# Patient Record
Sex: Female | Born: 1970 | Race: Black or African American | Hispanic: No | Marital: Married | Smoking: Never smoker
Health system: Southern US, Community
[De-identification: ages and names within clinical notes are randomized; demographics above are authoritative.]

## PROBLEM LIST (undated history)

## (undated) DIAGNOSIS — B019 Varicella without complication: Secondary | ICD-10-CM

## (undated) DIAGNOSIS — I10 Essential (primary) hypertension: Secondary | ICD-10-CM

## (undated) DIAGNOSIS — R51 Headache: Secondary | ICD-10-CM

## (undated) DIAGNOSIS — R519 Headache, unspecified: Secondary | ICD-10-CM

## (undated) DIAGNOSIS — R030 Elevated blood-pressure reading, without diagnosis of hypertension: Secondary | ICD-10-CM

## (undated) DIAGNOSIS — IMO0001 Reserved for inherently not codable concepts without codable children: Secondary | ICD-10-CM

## (undated) DIAGNOSIS — J45909 Unspecified asthma, uncomplicated: Secondary | ICD-10-CM

## (undated) DIAGNOSIS — M549 Dorsalgia, unspecified: Secondary | ICD-10-CM

## (undated) DIAGNOSIS — F419 Anxiety disorder, unspecified: Secondary | ICD-10-CM

## (undated) HISTORY — DX: Unspecified asthma, uncomplicated: J45.909

## (undated) HISTORY — DX: Anxiety disorder, unspecified: F41.9

## (undated) HISTORY — DX: Reserved for inherently not codable concepts without codable children: IMO0001

## (undated) HISTORY — DX: Headache, unspecified: R51.9

## (undated) HISTORY — PX: EYE SURGERY: SHX253

## (undated) HISTORY — DX: Headache: R51

## (undated) HISTORY — DX: Varicella without complication: B01.9

## (undated) HISTORY — DX: Dorsalgia, unspecified: M54.9

## (undated) HISTORY — PX: APPENDECTOMY: SHX54

## (undated) HISTORY — DX: Elevated blood-pressure reading, without diagnosis of hypertension: R03.0

---

## 1999-01-31 ENCOUNTER — Emergency Department (HOSPITAL_COMMUNITY): Admission: EM | Admit: 1999-01-31 | Discharge: 1999-01-31 | Payer: Self-pay | Admitting: Emergency Medicine

## 2003-02-04 ENCOUNTER — Emergency Department (HOSPITAL_COMMUNITY): Admission: EM | Admit: 2003-02-04 | Discharge: 2003-02-04 | Payer: Self-pay | Admitting: Emergency Medicine

## 2003-07-04 ENCOUNTER — Emergency Department (HOSPITAL_COMMUNITY): Admission: EM | Admit: 2003-07-04 | Discharge: 2003-07-04 | Payer: Self-pay | Admitting: Emergency Medicine

## 2004-07-05 ENCOUNTER — Emergency Department: Payer: Self-pay | Admitting: Emergency Medicine

## 2004-08-15 ENCOUNTER — Emergency Department: Payer: Self-pay | Admitting: Emergency Medicine

## 2004-11-15 ENCOUNTER — Emergency Department: Payer: Self-pay | Admitting: Emergency Medicine

## 2005-01-08 ENCOUNTER — Emergency Department: Payer: Self-pay | Admitting: Emergency Medicine

## 2006-06-05 ENCOUNTER — Other Ambulatory Visit: Payer: Self-pay

## 2006-06-05 ENCOUNTER — Emergency Department: Payer: Self-pay | Admitting: Emergency Medicine

## 2006-10-21 ENCOUNTER — Ambulatory Visit (HOSPITAL_COMMUNITY): Admission: RE | Admit: 2006-10-21 | Discharge: 2006-10-22 | Payer: Self-pay | Admitting: Ophthalmology

## 2007-07-06 ENCOUNTER — Emergency Department: Payer: Self-pay | Admitting: Emergency Medicine

## 2008-03-02 ENCOUNTER — Inpatient Hospital Stay: Payer: Self-pay | Admitting: Vascular Surgery

## 2009-09-15 ENCOUNTER — Emergency Department: Payer: Self-pay | Admitting: Emergency Medicine

## 2010-01-21 ENCOUNTER — Emergency Department (HOSPITAL_COMMUNITY): Admission: EM | Admit: 2010-01-21 | Discharge: 2010-01-21 | Payer: Self-pay | Admitting: Emergency Medicine

## 2010-03-25 ENCOUNTER — Emergency Department (HOSPITAL_COMMUNITY): Admission: EM | Admit: 2010-03-25 | Discharge: 2010-03-25 | Payer: Self-pay | Admitting: Emergency Medicine

## 2010-11-26 LAB — GC/CHLAMYDIA PROBE AMP, GENITAL
Chlamydia, DNA Probe: NEGATIVE
GC Probe Amp, Genital: NEGATIVE

## 2010-11-26 LAB — POCT I-STAT, CHEM 8
BUN: 9 mg/dL (ref 6–23)
Calcium, Ion: 1.15 mmol/L (ref 1.12–1.32)
Chloride: 105 mEq/L (ref 96–112)
Creatinine, Ser: 0.6 mg/dL (ref 0.4–1.2)
Glucose, Bld: 123 mg/dL — ABNORMAL HIGH (ref 70–99)
HCT: 38 % (ref 36.0–46.0)
Hemoglobin: 12.9 g/dL (ref 12.0–15.0)
Potassium: 4 mEq/L (ref 3.5–5.1)
Sodium: 139 mEq/L (ref 135–145)
TCO2: 25 mmol/L (ref 0–100)

## 2010-11-26 LAB — DIFFERENTIAL
Basophils Absolute: 0 10*3/uL (ref 0.0–0.1)
Basophils Relative: 0 % (ref 0–1)
Eosinophils Absolute: 0.1 10*3/uL (ref 0.0–0.7)
Eosinophils Relative: 2 % (ref 0–5)
Lymphocytes Relative: 24 % (ref 12–46)
Lymphs Abs: 1.3 10*3/uL (ref 0.7–4.0)
Monocytes Absolute: 0.3 10*3/uL (ref 0.1–1.0)
Monocytes Relative: 6 % (ref 3–12)
Neutro Abs: 3.7 10*3/uL (ref 1.7–7.7)
Neutrophils Relative %: 68 % (ref 43–77)

## 2010-11-26 LAB — CBC
HCT: 36.1 % (ref 36.0–46.0)
HCT: 38.9 % (ref 36.0–46.0)
Hemoglobin: 12.5 g/dL (ref 12.0–15.0)
Hemoglobin: 13.2 g/dL (ref 12.0–15.0)
MCHC: 33.9 g/dL (ref 30.0–36.0)
MCHC: 34.7 g/dL (ref 30.0–36.0)
MCV: 90 fL (ref 78.0–100.0)
MCV: 90.2 fL (ref 78.0–100.0)
Platelets: 106 10*3/uL — ABNORMAL LOW (ref 150–400)
Platelets: 109 10*3/uL — ABNORMAL LOW (ref 150–400)
RBC: 4.01 MIL/uL (ref 3.87–5.11)
RBC: 4.32 MIL/uL (ref 3.87–5.11)
RDW: 13.4 % (ref 11.5–15.5)
RDW: 13.4 % (ref 11.5–15.5)
WBC: 5.5 10*3/uL (ref 4.0–10.5)
WBC: 5.9 10*3/uL (ref 4.0–10.5)

## 2010-11-26 LAB — WET PREP, GENITAL
Trich, Wet Prep: NONE SEEN
WBC, Wet Prep HPF POC: NONE SEEN
Yeast Wet Prep HPF POC: NONE SEEN

## 2010-11-26 LAB — URINALYSIS, ROUTINE W REFLEX MICROSCOPIC
Bilirubin Urine: NEGATIVE
Glucose, UA: NEGATIVE mg/dL
Ketones, ur: NEGATIVE mg/dL
Nitrite: NEGATIVE
Protein, ur: NEGATIVE mg/dL
Specific Gravity, Urine: 1.018 (ref 1.005–1.030)
Urobilinogen, UA: 0.2 mg/dL (ref 0.0–1.0)
pH: 5 (ref 5.0–8.0)

## 2010-11-26 LAB — URINE MICROSCOPIC-ADD ON

## 2010-11-26 LAB — URINE CULTURE
Colony Count: NO GROWTH
Culture: NO GROWTH

## 2010-11-26 LAB — POCT PREGNANCY, URINE: Preg Test, Ur: NEGATIVE

## 2010-12-16 ENCOUNTER — Emergency Department: Payer: Self-pay | Admitting: Emergency Medicine

## 2011-01-25 NOTE — Op Note (Signed)
Karina Neal, Karina Neal                ACCOUNT NO.:  [id number]   MEDICAL RECORD NO.:  [id number]          PATIENT TYPE:  AMB   LOCATION:  SDS                          FACILITY:  [facility]   PHYSICIAN:  John D. [name], M.D. DATE OF BIRTH:  01/11/1971   DATE OF PROCEDURE:  10/21/2006  DATE OF DISCHARGE:                               OPERATIVE REPORT   ADMISSION DIAGNOSIS:  Rhegmatogenous retinal detachment, left eye;  retinal breaks, right eye.   PROCEDURES:  Retinal photocoagulation for retinal breaks, right eye.  Scleral buckle with retinal photocoagulation, left eye.   SURGEON:  Alan Mulder, M.D.   ASSISTANT:  Rosalie Doctor, MA   ANESTHESIA:  General.   DETAILS:  After proper endotracheal anesthesia was obtained, the  attention was carried to the right eye where retinal breaks were seen.  Indirect ophthalmoscope laser was used to surround the breaks with laser  the power was 270 milliwatts, 656 burns, 1000 microns each and 0.05  seconds each.  The eye was treated with ointment and covered.  Attention  was carried the left eye where the usual prep and drape was performed.  360 degrees limbal peritomy, isolation of four rectus muscles on 2-0  silk.  Localization of breaks in the nasal quadrant with detachment in  the nasal quadrant.  Scleral dissection from 4 o'clock around to 2  o'clock to admit a number 279 intrascleral implant.  Diathermy placed in  the bed. Two sutures per quadrant for a total of six sutures were placed  in the scleral flaps.  A 240 band was placed around the eye with a belt  loop at 2 o'clock and 4 o'clock and a 270 sleeve at 10 o'clock.  Perforations site chosen at 9 o'clock.  A moderate amount of clear  colorless subretinal fluid came forth.  Some pockets were thin and some  pockets were thick and sticky.  When retinal pigment came from came out  of the break of the perforation site, the drainage was stopped.  The 279  implant was placed against the globe  and the scleral flaps were closed.  Indirect ophthalmoscopy showed the retina be lying nicely on the scleral  buckle with minimal subretinal fluid remaining.  The indirect  ophthalmoscope laser was moved into place, 502 burns placed around the  retinal periphery on the scleral buckle.  Power was 270 milliwatts, 1000  microns each and 0.05 seconds each.  The buckle was adjusted and  trimmed.  The band was adjusted and trimmed.  The conjunctiva was  reposited with 7-0 chromic suture.  Polymyxin and gentamicin were  irrigated into Tenon's space, Decadron 10 mg was injected to the lower  subconjunctival space.  Atropine solution was applied.  Marcaine was  injected around the globe for postop pain. TobraDex, a patch and shield  were placed.  The closing tension was 10 with a Barraquer tonometer  after paracentesis x 1.  Complications none.  Duration 1 hour 30  minutes.  The patient is awakened, taken to recovery satisfactory  condition.      Karina Neal, M.D.  Electronically  Signed    JDM/MEDQ  D:  10/21/2006  T:  10/22/2006  Job:  [id number]

## 2011-03-03 ENCOUNTER — Ambulatory Visit: Payer: Self-pay | Admitting: Family Medicine

## 2012-03-28 ENCOUNTER — Emergency Department: Payer: Self-pay | Admitting: Internal Medicine

## 2012-03-29 ENCOUNTER — Emergency Department: Payer: Self-pay | Admitting: Internal Medicine

## 2013-03-02 ENCOUNTER — Observation Stay: Payer: Self-pay | Admitting: Specialist

## 2013-03-02 LAB — CBC
HCT: 36.8 % (ref 35.0–47.0)
HGB: 12.4 g/dL (ref 12.0–16.0)
MCH: 29.5 pg (ref 26.0–34.0)
MCHC: 33.6 g/dL (ref 32.0–36.0)
MCV: 88 fL (ref 80–100)
Platelet: 149 10*3/uL — ABNORMAL LOW (ref 150–440)
RBC: 4.21 10*6/uL (ref 3.80–5.20)
RDW: 13.1 % (ref 11.5–14.5)
WBC: 6.7 10*3/uL (ref 3.6–11.0)

## 2013-03-02 LAB — CK TOTAL AND CKMB (NOT AT ARMC)
CK, Total: 125 U/L (ref 21–215)
CK, Total: 110 U/L (ref 21–215)
CK-MB: 1.1 ng/mL (ref 0.5–3.6)
CK-MB: 0.8 ng/mL (ref 0.5–3.6)

## 2013-03-02 LAB — PROTIME-INR
INR: 1
Prothrombin Time: 13.6 secs (ref 11.5–14.7)

## 2013-03-02 LAB — BASIC METABOLIC PANEL
Anion Gap: 7 (ref 7–16)
BUN: 10 mg/dL (ref 7–18)
Calcium, Total: 8.8 mg/dL (ref 8.5–10.1)
Chloride: 107 mmol/L (ref 98–107)
Co2: 26 mmol/L (ref 21–32)
Creatinine: 0.77 mg/dL (ref 0.60–1.30)
EGFR (African American): >60
EGFR (Non-African Amer.): >60
Glucose: 91 mg/dL (ref 65–99)
Osmolality: 278 (ref 275–301)
Potassium: 3.3 mmol/L — ABNORMAL LOW (ref 3.5–5.1)
Sodium: 140 mmol/L (ref 136–145)

## 2013-03-02 LAB — TROPONIN I
Troponin-I: <0.02 ng/mL
Troponin-I: <0.02 ng/mL
Troponin-I: <0.02 ng/mL

## 2013-03-03 DIAGNOSIS — R079 Chest pain, unspecified: Secondary | ICD-10-CM

## 2013-03-03 LAB — LIPID PANEL
Cholesterol: 138 mg/dL (ref 0–200)
HDL Cholesterol: 42 mg/dL (ref 40–60)
Ldl Cholesterol, Calc: 80 mg/dL (ref 0–100)
Triglycerides: 81 mg/dL (ref 0–200)
VLDL Cholesterol, Calc: 16 mg/dL (ref 5–40)

## 2013-03-03 LAB — TSH: Thyroid Stimulating Horm: 1.61 u[IU]/mL

## 2013-03-03 LAB — CK TOTAL AND CKMB (NOT AT ARMC)
CK, Total: 102 U/L (ref 21–215)
CK-MB: 0.8 ng/mL (ref 0.5–3.6)

## 2013-03-03 LAB — MAGNESIUM: Magnesium: 1.7 mg/dL — ABNORMAL LOW

## 2013-03-03 LAB — BASIC METABOLIC PANEL
Anion Gap: 5 — ABNORMAL LOW (ref 7–16)
BUN: 10 mg/dL (ref 7–18)
Calcium, Total: 8.6 mg/dL (ref 8.5–10.1)
Chloride: 108 mmol/L — ABNORMAL HIGH (ref 98–107)
Co2: 28 mmol/L (ref 21–32)
Creatinine: 0.86 mg/dL (ref 0.60–1.30)
EGFR (African American): >60
EGFR (Non-African Amer.): >60
Glucose: 98 mg/dL (ref 65–99)
Osmolality: 280 (ref 275–301)
Potassium: 3.8 mmol/L (ref 3.5–5.1)
Sodium: 141 mmol/L (ref 136–145)

## 2013-03-03 LAB — TROPONIN I: Troponin-I: <0.02 ng/mL

## 2013-03-04 ENCOUNTER — Emergency Department (HOSPITAL_COMMUNITY): Payer: Managed Care, Other (non HMO)

## 2013-03-04 ENCOUNTER — Encounter (HOSPITAL_COMMUNITY): Payer: Self-pay

## 2013-03-04 ENCOUNTER — Emergency Department (HOSPITAL_COMMUNITY)
Admission: EM | Admit: 2013-03-04 | Discharge: 2013-03-04 | Disposition: A | Discharge status: Home / Self Care | Payer: Managed Care, Other (non HMO) | Attending: Emergency Medicine | Admitting: Emergency Medicine

## 2013-03-04 DIAGNOSIS — R079 Chest pain, unspecified: Secondary | ICD-10-CM | POA: Insufficient documentation

## 2013-03-04 DIAGNOSIS — Z7982 Long term (current) use of aspirin: Secondary | ICD-10-CM | POA: Insufficient documentation

## 2013-03-04 DIAGNOSIS — Z880 Allergy status to penicillin: Secondary | ICD-10-CM | POA: Insufficient documentation

## 2013-03-04 LAB — BASIC METABOLIC PANEL
BUN: 9 mg/dL (ref 6–23)
CO2: 27 mEq/L (ref 19–32)
Calcium: 9 mg/dL (ref 8.4–10.5)
Chloride: 104 mEq/L (ref 96–112)
Creatinine, Ser: 0.73 mg/dL (ref 0.50–1.10)
GFR calc Af Amer: >90 mL/min (ref >90)
GFR calc non Af Amer: >90 mL/min (ref >90)
Glucose, Bld: 99 mg/dL (ref 70–99)
Potassium: 3.6 mEq/L (ref 3.5–5.1)
Sodium: 139 mEq/L (ref 135–145)

## 2013-03-04 LAB — D-DIMER, QUANTITATIVE: D-Dimer, Quant: <0.27 ug/mL-FEU (ref 0.00–0.48)

## 2013-03-04 LAB — CBC
HCT: 36.4 % (ref 36.0–46.0)
Hemoglobin: 12.4 g/dL (ref 12.0–15.0)
MCH: 29.9 pg (ref 26.0–34.0)
MCHC: 34.1 g/dL (ref 30.0–36.0)
MCV: 87.7 fL (ref 78.0–100.0)
Platelets: 146 10*3/uL — ABNORMAL LOW (ref 150–400)
RBC: 4.15 MIL/uL (ref 3.87–5.11)
RDW: 12.6 % (ref 11.5–15.5)
WBC: 6.8 10*3/uL (ref 4.0–10.5)

## 2013-03-04 LAB — TROPONIN I: Troponin I: <0.3 ng/mL (ref <0.30)

## 2013-03-04 MED ORDER — MORPHINE SULFATE 4 MG/ML IJ SOLN
4.0000 mg | Freq: Once | INTRAMUSCULAR | Status: AC
  Administered 2013-03-04: 4 mg via INTRAVENOUS
  Filled 2013-03-04: qty 1

## 2013-03-04 MED ORDER — ACETAMINOPHEN 500 MG PO TABS: 500.0000 mg | ORAL_TABLET | Freq: Four times a day (QID) | ORAL | Status: DC | PRN

## 2013-03-04 NOTE — ED Notes (Signed)
Just had lunch w/ husband at [facility] and she was driving home and felt heaviness in left shoulder had some sob and tingling in left arm  , no nausea. Had just been seen at [facility] reg for same cp that she had before kept over night and released yesterday. Took 324 asa this am.

## 2013-03-04 NOTE — ED Provider Notes (Signed)
History    CSN: [id number] Arrival date & time 03/04/13  1240  First MD Initiated Contact with Patient 03/04/13 1250     No chief complaint on file.  (Consider location/radiation/quality/duration/timing/severity/associated sxs/prior Treatment) HPI Pt is a 42yo remale presenting with chest pain that started on Tuesday 6/24, described as sharp, 10/10, constant, does not radiate, located in left sternal border.  Pt states she was admitted over night and discharged yesterday at [facility] for same and reports having negative stress test prior to discharge.  Was advised to f/u with her PCP, Dr. Ignacia Marvel,  no other referral or new medications prescribed.  Has tried aspirin and ibuprofen w/o relief of pain. Pt states pain is still there, worse with movement and deep breathing. Denies fever, diaphoresis, n/v/d.  Denies cough or SOB.  Denies cardiac or pulmonary hx besides yesterday.   History reviewed. No pertinent past medical history. Past Surgical History  Procedure Laterality Date  . Cesarean section    . Eye surgery    . Appendectomy     History reviewed. No pertinent family history. History  Substance Use Topics  . Smoking status: Never Smoker   . Smokeless tobacco: Not on file  . Alcohol Use: No   OB History   Grav Para Term Preterm Abortions TAB SAB Ect Mult Living                 Review of Systems  Constitutional: Negative for fever, chills, diaphoresis, appetite change and fatigue.  Respiratory: Negative for cough, chest tightness and shortness of breath.   Cardiovascular: Positive for chest pain. Negative for palpitations and leg swelling.  Gastrointestinal: Negative for nausea, vomiting and diarrhea.  All other systems reviewed and are negative.    Allergies  Penicillins and Shellfish allergy  Home Medications   Current Outpatient Rx  Name  Route  Sig  Dispense  Refill  . aspirin 81 MG tablet   Oral   Take 81 mg by mouth daily.         Marland Kitchen acetaminophen (TYLENOL)  500 MG tablet   Oral   Take 1 tablet (500 mg total) by mouth every 6 (six) hours as needed for pain.   30 tablet   0    BP 100/56  Pulse 70  Temp(Src) 98.8 F (37.1 C) (Oral)  Resp 19  SpO2 98%  LMP 02/25/2013 Physical Exam  Nursing note and vitals reviewed. Constitutional: She appears well-developed and well-nourished. No distress.  Pt lying comfortably on exam bed, NAD.  HENT:  Head: Normocephalic and atraumatic.  Eyes: Conjunctivae are normal. No scleral icterus.  Neck: Normal range of motion.  Cardiovascular: Normal rate, regular rhythm and normal heart sounds.   Pulmonary/Chest: Effort normal and breath sounds normal. No respiratory distress. She has no wheezes. She has no rales. She exhibits tenderness ( left sternal border).  Abdominal: Soft. Bowel sounds are normal. She exhibits no distension and no mass. There is no tenderness. There is no rebound and no guarding.  Musculoskeletal: Normal range of motion.  Neurological: She is alert.  Skin: Skin is warm and dry. She is not diaphoretic.    ED Course  Procedures (including critical care time) Labs Reviewed  CBC - Abnormal; Notable for the following:    Platelets 146 (*)    All other components within normal limits  BASIC METABOLIC PANEL  D-DIMER, QUANTITATIVE  TROPONIN I   Dg Chest 2 View  03/04/2013   *RADIOLOGY REPORT*  Clinical Data: Weak and  shortness of breath.  CHEST - 2 VIEW  Comparison: None.  Findings: Normal heart size with clear lung fields.  No bony abnormality.  No effusion or pneumothorax.  IMPRESSION: Negative chest.   Original Report Authenticated By: Davonna Belling, M.D.    Date: 03/04/2013  Rate: 92  Rhythm: normal sinus rhythm  QRS Axis: normal  Intervals: normal  ST/T Wave abnormalities: nonspecific T wave changes  Conduction Disutrbances:none  Narrative Interpretation:   Old EKG Reviewed: unchanged    1. Chest pain     MDM  Concern for possible PE after negative cardiac workup  including negative stress test preformed at [facility] yesterday, however pt still c/o severe CP, worse with movement and breathing.  Will repeat labs including D-dimer and get CXR.  Troponin: nl CBC: unremarkable, PLT consistent with pt's baseline, actually higher than pt's baseline, closer to normal limits. BMP: unremarkable. D-dimer: WNL  CXR: negative  Cause of pt's CP unknown.  Will discharge pt home and have her f/u with PCP. F/u with PCP, [facility] as well as [facility] info provided. Return precautions given. Pt verbalized understanding and agreement with tx plan. Vitals: unremarkable. Discharged in stable condition.     Discussed pt with Dr. Rubin Payor during ED encounter.       Junius Finner, PA-C 03/04/13 1459

## 2013-03-04 NOTE — ED Notes (Signed)
Pt presents with onset of L sided chest pain that began today while driving.  Pt was admitted 2 days ago at [facility] for same, discharged yesterday after stress test (which was negative).  ASA 324mg  taken this morning, pt requested to not be given NTG.  PIV in place; EKG: inverted T-waves, NSR.

## 2013-03-04 NOTE — Discharge Instructions (Signed)
Chest Pain (Nonspecific) Chest pain has many causes. Your pain could be caused by something serious, such as a heart attack or a blood clot in the lungs. It could also be caused by something less serious, such as a chest bruise or a virus. Follow up with your doctor. More lab tests or other studies may be needed to find the cause of your pain. Most of the time, nonspecific chest pain will improve within 2 to 3 days of rest and mild pain medicine. HOME CARE  For chest bruises, you may put ice on the sore area for 15-20 minutes, 3-4 times a day. Do this only if it makes you or your child feel better.  Put ice in a plastic bag.  Place a towel between the skin and the bag.  Rest for the next 2 to 3 days.  Go back to work if the pain improves.  See your doctor if the pain lasts longer than 1 to 2 weeks.  Only take medicine as told by your doctor.  Quit smoking if you smoke. GET HELP RIGHT AWAY IF:   There is more pain or pain that spreads to the arm, neck, jaw, back, or belly (abdomen).  You or your child has shortness of breath.  You or your child coughs more than usual or coughs up blood.  You or your child has very bad back or belly pain, feels sick to his or her stomach (nauseous), or throws up (vomits).  You or your child has very bad weakness.  You or your child passes out (faints).  You or your child has a temperature by mouth above 102 F (38.9 C), not controlled by medicine. Any of these problems may be serious and may be an emergency. Do not wait to see if the problems will go away. Get medical help right away. Call your local emergency services 911 in [place] Do not drive yourself to the hospital. MAKE SURE YOU:   Understand these instructions.  Will watch this condition.  Will get help right away if you or your child is not doing well or gets worse. Document Released: 02/12/2008 Document Revised: 11/18/2011 Document Reviewed: 02/12/2008 [facility] Patient Information  2014 [place], [place].  Aspirin and Your Heart Aspirin affects the way your blood clots and helps "thin" the blood. Aspirin has many uses in heart disease. It may be used as a primary prevention to help reduce the risk of heart related events. It also can be used as a secondary measure to prevent more heart attacks or to prevent additional damage from blood clots.  ASPIRIN MAY HELP IF YOU:  Have had a heart attack or chest pain.  Have undergone open heart surgery such as CABG (Coronary Artery Bypass Surgery).  Have had coronary angioplasty with or without stents.  Have experienced a stroke or TIA (transient ischemic attack).  Have peripheral vascular disease (PAD).  Have chronic heart rhythm problems such as atrial fibrillation.  Are at risk for heart disease. BEFORE STARTING ASPIRIN Before you start taking aspirin, your caregiver will need to review your medical history. Many things will need to be taken into consideration, such as:  Smoking status.  Blood pressure.  Diabetes.  Gender.  Weight.  Cholesterol level. ASPIRIN DOSES  Aspirin should only be taken on the advice of your caregiver. Talk to your caregiver about how much aspirin you should take. Aspirin comes in different doses such as:  81 mg.  162 mg.  325 mg.  The aspirin dose  you take may be affected by many factors, some of which include:  Your current medications, especially if your are taking blood-thinners or anti-platelet medicine.  Liver function.  Heart disease risk.  Age.  Aspirin comes in two forms:  Non-enteric-coated. This type of aspirin does not have a coating and is absorbed faster. Non-enteric coated aspirin is recommended for patients experiencing chest pain symptoms. This type of aspirin also comes in a chewable form.  Enteric-coated. This means the aspirin has a special coating that releases the medicine very slowly. Enteric-coated aspirin causes less stomach upset. This type of  aspirin should not be chewed or crushed. ASPIRIN SIDE EFFECTS Daily use of aspirin can increase your risk of serious side effects, some of these include:  Increased bleeding. This can range from a cut that does not stop bleeding to more serious problems such as stomach bleeding or bleeding into the brain (Intracerebral bleeding).  Increased bruising.  Stomach upset.  An allergic reaction such as red, itchy skin.  Increased risk of bleeding when combined with non-steroidal anti-inflammatory medicine (NSAIDS).  Alcohol should be drank in moderation when taking aspirin. Alcohol can increase the risk of stomach bleeding when taken with aspirin.  Aspirin should not be given to children less than 32 years of age due to the association of Reye syndrome. Reye syndrome is a serious illness that can affect the brain and liver. Studies have linked Reye syndrome with aspirin use in children.  People that have nasal polyps have an increased risk of developing an aspirin allergy. SEEK MEDICAL CARE IF:   You develop an allergic reaction such as:  Hives.  Itchy skin.  Swelling of the lips, tongue or face.  You develop stomach pain.  You have unusual bleeding or bruising.  You have ringing in your ears. SEEK IMMEDIATE MEDICAL CARE IF:   You have severe chest pain, especially if the pain is crushing or pressure-like and spreads to the arms, back, neck, or jaw. THIS IS AN EMERGENCY. Do not wait to see if the pain will go away. Get medical help at once. Call your local emergency services (911 in the [place] DO NOT drive yourself to the hospital.  You have stroke-like symptoms such as:  Loss of vision.  Difficulty talking.  Numbness or weakness on one side of your body.  Numbness or weakness in your arm or leg.  Not thinking clearly or feeling confused.  Your bowel movements are bloody, dark red or black in color.  You vomit or cough up blood.  You have blood in your urine.  You  have shortness of breath, coughing or wheezing. MAKE SURE YOU:   Understand these instructions.  Will monitor your condition.  Seek immediate medical care if necessary. Document Released: 08/08/2008 Document Revised: 11/18/2011 Document Reviewed: 08/08/2008 [facility] Patient Information 2014 [place], [place].

## 2013-03-05 NOTE — ED Provider Notes (Signed)
Medical screening examination/treatment/procedure(s) were performed by non-physician practitioner and as supervising physician I was immediately available for consultation/collaboration. Chest pain. Stress test yesterday. Negative d-dimer here. Will discharge  Karina Neal. Rubin Payor, MD 03/05/13 [phone]

## 2013-05-22 ENCOUNTER — Emergency Department: Payer: Self-pay | Admitting: Emergency Medicine

## 2013-10-20 ENCOUNTER — Emergency Department: Payer: Self-pay | Admitting: Emergency Medicine

## 2013-11-28 ENCOUNTER — Emergency Department: Payer: Self-pay | Admitting: Emergency Medicine

## 2013-12-15 ENCOUNTER — Emergency Department: Payer: Self-pay | Admitting: Emergency Medicine

## 2014-04-06 ENCOUNTER — Emergency Department: Payer: Self-pay | Admitting: Internal Medicine

## 2014-04-06 LAB — COMPREHENSIVE METABOLIC PANEL
Albumin: 3.4 g/dL (ref 3.4–5.0)
Alkaline Phosphatase: 46 U/L
Anion Gap: 7 (ref 7–16)
BUN: 10 mg/dL (ref 7–18)
Bilirubin,Total: 0.6 mg/dL (ref 0.2–1.0)
Calcium, Total: 7.8 mg/dL — ABNORMAL LOW (ref 8.5–10.1)
Chloride: 104 mmol/L (ref 98–107)
Co2: 26 mmol/L (ref 21–32)
Creatinine: 0.58 mg/dL — ABNORMAL LOW (ref 0.60–1.30)
EGFR (African American): >60
EGFR (Non-African Amer.): >60
Glucose: 87 mg/dL (ref 65–99)
Osmolality: 272 (ref 275–301)
Potassium: 3.7 mmol/L (ref 3.5–5.1)
SGOT(AST): 17 U/L (ref 15–37)
SGPT (ALT): 20 U/L
Sodium: 137 mmol/L (ref 136–145)
Total Protein: 7 g/dL (ref 6.4–8.2)

## 2014-04-06 LAB — URINALYSIS, COMPLETE
Bacteria: NONE SEEN
Bilirubin,UR: NEGATIVE
Blood: NEGATIVE
Glucose,UR: NEGATIVE mg/dL (ref 0–75)
Ketone: NEGATIVE
Leukocyte Esterase: NEGATIVE
Nitrite: NEGATIVE
Ph: 7 (ref 4.5–8.0)
Protein: NEGATIVE
RBC,UR: <1 /HPF (ref 0–5)
Specific Gravity: 1.019 (ref 1.003–1.030)
Squamous Epithelial: 5
WBC UR: 1 /HPF (ref 0–5)

## 2014-04-06 LAB — LIPASE, BLOOD: Lipase: 149 U/L (ref 73–393)

## 2014-04-06 LAB — CBC
HCT: 36.4 % (ref 35.0–47.0)
HGB: 12.1 g/dL (ref 12.0–16.0)
MCH: 29.3 pg (ref 26.0–34.0)
MCHC: 33.1 g/dL (ref 32.0–36.0)
MCV: 88 fL (ref 80–100)
Platelet: 104 10*3/uL — ABNORMAL LOW (ref 150–440)
RBC: 4.12 10*6/uL (ref 3.80–5.20)
RDW: 13.9 % (ref 11.5–14.5)
WBC: 4.5 10*3/uL (ref 3.6–11.0)

## 2014-04-06 LAB — TROPONIN I: Troponin-I: <0.02 ng/mL

## 2014-05-20 ENCOUNTER — Emergency Department: Payer: Self-pay | Admitting: Student

## 2014-05-21 ENCOUNTER — Emergency Department: Payer: Self-pay | Admitting: Emergency Medicine

## 2014-05-21 LAB — CBC WITH DIFFERENTIAL/PLATELET
Basophil #: 0.1 10*3/uL (ref 0.0–0.1)
Basophil %: 1.1 %
Eosinophil #: 0.1 10*3/uL (ref 0.0–0.7)
Eosinophil %: 1.2 %
HCT: 36.8 % (ref 35.0–47.0)
HGB: 12.3 g/dL (ref 12.0–16.0)
Lymphocyte #: 2 10*3/uL (ref 1.0–3.6)
Lymphocyte %: 31.3 %
MCH: 29.7 pg (ref 26.0–34.0)
MCHC: 33.5 g/dL (ref 32.0–36.0)
MCV: 89 fL (ref 80–100)
Monocyte #: 0.4 x10 3/mm (ref 0.2–0.9)
Monocyte %: 6.3 %
Neutrophil #: 3.8 10*3/uL (ref 1.4–6.5)
Neutrophil %: 60.1 %
Platelet: 155 10*3/uL (ref 150–440)
RBC: 4.14 10*6/uL (ref 3.80–5.20)
RDW: 14.2 % (ref 11.5–14.5)
WBC: 6.3 10*3/uL (ref 3.6–11.0)

## 2014-05-21 LAB — BASIC METABOLIC PANEL
Anion Gap: 8 (ref 7–16)
BUN: 10 mg/dL (ref 7–18)
Calcium, Total: 8.4 mg/dL — ABNORMAL LOW (ref 8.5–10.1)
Chloride: 108 mmol/L — ABNORMAL HIGH (ref 98–107)
Co2: 24 mmol/L (ref 21–32)
Creatinine: 0.91 mg/dL (ref 0.60–1.30)
EGFR (African American): >60
EGFR (Non-African Amer.): >60
Glucose: 110 mg/dL — ABNORMAL HIGH (ref 65–99)
Osmolality: 279 (ref 275–301)
Potassium: 3.5 mmol/L (ref 3.5–5.1)
Sodium: 140 mmol/L (ref 136–145)

## 2014-05-21 LAB — URINALYSIS, COMPLETE
Bacteria: NONE SEEN
Bilirubin,UR: NEGATIVE
Glucose,UR: NEGATIVE mg/dL (ref 0–75)
Ketone: NEGATIVE
Leukocyte Esterase: NEGATIVE
Nitrite: NEGATIVE
Ph: 5 (ref 4.5–8.0)
Protein: NEGATIVE
RBC,UR: 7 /HPF (ref 0–5)
Specific Gravity: 1.03 (ref 1.003–1.030)
Squamous Epithelial: 1
WBC UR: 1 /HPF (ref 0–5)

## 2014-05-21 LAB — TROPONIN I: Troponin-I: <0.02 ng/mL

## 2014-05-22 ENCOUNTER — Encounter (HOSPITAL_COMMUNITY): Payer: Self-pay | Admitting: Emergency Medicine

## 2014-05-22 ENCOUNTER — Emergency Department (HOSPITAL_COMMUNITY)
Admission: EM | Admit: 2014-05-22 | Discharge: 2014-05-23 | Disposition: A | Discharge status: Home / Self Care | Payer: Managed Care, Other (non HMO) | Attending: Emergency Medicine | Admitting: Emergency Medicine

## 2014-05-22 DIAGNOSIS — Z880 Allergy status to penicillin: Secondary | ICD-10-CM | POA: Diagnosis not present

## 2014-05-22 DIAGNOSIS — R51 Headache: Secondary | ICD-10-CM | POA: Diagnosis present

## 2014-05-22 DIAGNOSIS — Z7982 Long term (current) use of aspirin: Secondary | ICD-10-CM | POA: Insufficient documentation

## 2014-05-22 DIAGNOSIS — R519 Headache, unspecified: Secondary | ICD-10-CM

## 2014-05-22 DIAGNOSIS — R42 Dizziness and giddiness: Secondary | ICD-10-CM | POA: Insufficient documentation

## 2014-05-22 MED ORDER — DIPHENHYDRAMINE HCL 50 MG/ML IJ SOLN
25.0000 mg | Freq: Once | INTRAMUSCULAR | Status: AC
  Administered 2014-05-22: 25 mg via INTRAVENOUS
  Filled 2014-05-22: qty 1

## 2014-05-22 MED ORDER — SODIUM CHLORIDE 0.9 % IV BOLUS (SEPSIS)
1000.0000 mL | Freq: Once | INTRAVENOUS | Status: AC
  Administered 2014-05-22: 1000 mL via INTRAVENOUS

## 2014-05-22 MED ORDER — METOCLOPRAMIDE HCL 5 MG/ML IJ SOLN
10.0000 mg | Freq: Once | INTRAMUSCULAR | Status: AC
  Administered 2014-05-22: 10 mg via INTRAVENOUS
  Filled 2014-05-22: qty 2

## 2014-05-22 MED ORDER — KETOROLAC TROMETHAMINE 30 MG/ML IJ SOLN
30.0000 mg | Freq: Once | INTRAMUSCULAR | Status: AC
  Administered 2014-05-22: 30 mg via INTRAVENOUS
  Filled 2014-05-22: qty 1

## 2014-05-22 NOTE — ED Notes (Signed)
Pt to ED c/o headaches x 2 weeks; associated with dizziness, blurred vision, photophobia, and diaphoresis. Pain worsens when ambulating. No previous history of migraines.

## 2014-05-22 NOTE — ED Notes (Signed)
The pt has had a headache for 2 weeks she has been to  ed x 2 and both times she left  Too long of a wait

## 2014-05-22 NOTE — ED Provider Notes (Signed)
CSN: [id number]     Arrival date & time 05/22/14  2135 History   First MD Initiated Contact with Patient 05/22/14 2306     Chief Complaint  Patient presents with  . Migraine     (Consider location/radiation/quality/duration/timing/severity/associated sxs/prior Treatment) HPI Comments: Patient presents to the ED with a chief complaint of headache.  She states that the headache has been intermittent for the past 2 weeks. Headache is not thunderclap in origin. She denies history of headaches, but states that recently she has been having more frequent headaches.  She denies any fevers, chills, nausea, or vomiting.  She reports associated photophobia and phonophobia as well as some dizziness, which she describes as a light-headedness.  She states that she was seen at [facility] about 2 months ago and had a normal head CT.    The history is provided by the patient. No language interpreter was used.    History reviewed. No pertinent past medical history. Past Surgical History  Procedure Laterality Date  . Cesarean section    . Eye surgery    . Appendectomy     No family history on file. History  Substance Use Topics  . Smoking status: Never Smoker   . Smokeless tobacco: Not on file  . Alcohol Use: No   OB History   Grav Para Term Preterm Abortions TAB SAB Ect Mult Living                 Review of Systems  Constitutional: Negative for fever and chills.  Respiratory: Negative for shortness of breath.   Cardiovascular: Negative for chest pain.  Gastrointestinal: Negative for nausea, vomiting, diarrhea and constipation.  Genitourinary: Negative for dysuria.  Neurological: Positive for dizziness and headaches.  All other systems reviewed and are negative.     Allergies  Penicillins and Shellfish allergy  Home Medications   Prior to Admission medications   Medication Sig Start Date End Date Taking? Authorizing Provider  acetaminophen (TYLENOL) 500 MG tablet Take 1  tablet (500 mg total) by mouth every 6 (six) hours as needed for pain. 03/04/13   Junius Finner, PA-C  aspirin 81 MG tablet Take 81 mg by mouth daily.    Historical Provider, MD   BP 144/92  Pulse 82  Temp(Src) 98.5 F (36.9 C) (Oral)  Resp 18  SpO2 98% Physical Exam  Nursing note and vitals reviewed. Constitutional: She is oriented to person, place, and time. She appears well-developed and well-nourished.  HENT:  Head: Normocephalic and atraumatic.  Right Ear: External ear normal.  Left Ear: External ear normal.  Eyes: Conjunctivae and EOM are normal. Pupils are equal, round, and reactive to light.  Neck: Normal range of motion. Neck supple.  No pain with neck flexion, no meningismus  Cardiovascular: Normal rate, regular rhythm and normal heart sounds.  Exam reveals no gallop and no friction rub.   No murmur heard. Pulmonary/Chest: Effort normal and breath sounds normal. No respiratory distress. She has no wheezes. She has no rales. She exhibits no tenderness.  Abdominal: Soft. She exhibits no distension and no mass. There is no tenderness. There is no rebound and no guarding.  Musculoskeletal: Normal range of motion. She exhibits no edema and no tenderness.  Normal gait.  Neurological: She is alert and oriented to person, place, and time. She has normal reflexes.  CN 3-12 intact, normal finger to nose, no pronator drift, sensation and strength intact bilaterally.  Skin: Skin is warm and dry.  Psychiatric: She has  a normal mood and affect. Her behavior is normal. Judgment and thought content normal.    ED Course  Procedures (including critical care time) Labs Review Labs Reviewed - No data to display  Imaging Review No results found.   EKG Interpretation None      MDM   Final diagnoses:  Headache, unspecified headache type    Pt HA treated and improved while in ED.  Presentation is non concerning for [facility], ICH, Meningitis, or temporal arteritis. Pt is afebrile with no  focal neuro deficits, nuchal rigidity, or change in vision. Pt is to follow up with PCP to discuss prophylactic medication. Pt verbalizes understanding and is agreeable with plan to dc.   Patient discussed with Dr. Norlene Campbell, who agrees with the plan.   Roxy Horseman, PA-C 05/23/14 0030  Roxy Horseman, PA-C 05/23/14 [phone]

## 2014-05-23 NOTE — ED Provider Notes (Signed)
Medical screening examination/treatment/procedure(s) were performed by non-physician practitioner and as supervising physician I was immediately available for consultation/collaboration.   EKG Interpretation None       Olivia Mackie, MD 05/23/14 [phone]

## 2014-05-23 NOTE — Discharge Instructions (Signed)

## 2014-07-23 ENCOUNTER — Emergency Department: Payer: Self-pay | Admitting: Emergency Medicine

## 2014-09-14 ENCOUNTER — Emergency Department (HOSPITAL_COMMUNITY)
Admission: EM | Admit: 2014-09-14 | Discharge: 2014-09-14 | Disposition: A | Discharge status: Home / Self Care | Payer: Managed Care, Other (non HMO) | Attending: Emergency Medicine | Admitting: Emergency Medicine

## 2014-09-14 ENCOUNTER — Encounter (HOSPITAL_COMMUNITY): Payer: Self-pay | Admitting: Emergency Medicine

## 2014-09-14 DIAGNOSIS — Z880 Allergy status to penicillin: Secondary | ICD-10-CM | POA: Insufficient documentation

## 2014-09-14 DIAGNOSIS — Z7982 Long term (current) use of aspirin: Secondary | ICD-10-CM | POA: Insufficient documentation

## 2014-09-14 DIAGNOSIS — M5442 Lumbago with sciatica, left side: Secondary | ICD-10-CM | POA: Insufficient documentation

## 2014-09-14 MED ORDER — HYDROCODONE-ACETAMINOPHEN 5-325 MG PO TABS: 1.0000 | ORAL_TABLET | ORAL | Status: DC | PRN

## 2014-09-14 MED ORDER — METHOCARBAMOL 500 MG PO TABS
500.0000 mg | ORAL_TABLET | Freq: Once | ORAL | Status: AC
  Administered 2014-09-14: 500 mg via ORAL
  Filled 2014-09-14: qty 1

## 2014-09-14 MED ORDER — HYDROCODONE-ACETAMINOPHEN 5-325 MG PO TABS
1.0000 | ORAL_TABLET | Freq: Once | ORAL | Status: AC
Start: 2014-09-14 — End: 2014-09-14
  Administered 2014-09-14: 1 via ORAL
  Filled 2014-09-14: qty 1

## 2014-09-14 MED ORDER — METHOCARBAMOL 500 MG PO TABS: 500.0000 mg | ORAL_TABLET | Freq: Two times a day (BID) | ORAL | Status: DC

## 2014-09-14 MED ORDER — MELOXICAM 7.5 MG PO TABS: 7.5000 mg | ORAL_TABLET | Freq: Every day | ORAL | Status: DC

## 2014-09-14 NOTE — ED Provider Notes (Signed)
CSN: [id number]     Arrival date & time 09/14/14  0926 History  This chart was scribed for non-physician practitioner, Junius FinnerErin O'Malley, PA-C working with Glynn OctaveStephen Rancour, MD by Gwenyth Oberatherine Macek, ED scribe. This patient was seen in room TR05C/TR05C and the patient's care was started at 9:54 AM    Chief Complaint  Patient presents with  . Back Pain   The history is provided by the patient. No language interpreter was used.    HPI Comments: Karina Neal is a 44 y.o. female who presents to the Emergency Department complaining of constant left lower back pain that radiates to her left hip and thigh and started 2 days ago. Pt states pain became worse yesterday morning. She has tried heat, Ibuprofen and Tylenol with no relief to symptoms. Pt denies a history of back surgery, heavy lifting, recent falls or injuries. She also denies nausea, vomiting, numbness, upper back pain and dysuria as associated symptoms.  No PCP  History reviewed. No pertinent past medical history. Past Surgical History  Procedure Laterality Date  . Cesarean section    . Eye surgery    . Appendectomy     No family history on file. History  Substance Use Topics  . Smoking status: Never Smoker   . Smokeless tobacco: Not on file  . Alcohol Use: No   OB History    No data available     Review of Systems  Gastrointestinal: Negative for nausea and vomiting.  Genitourinary: Negative for dysuria.  Musculoskeletal: Positive for back pain and arthralgias. Negative for gait problem.  Skin: Negative for wound.  Neurological: Negative for numbness.  All other systems reviewed and are negative.     Allergies  Penicillins and Shellfish allergy  Home Medications   Prior to Admission medications   Medication Sig Start Date End Date Taking? Authorizing Provider  acetaminophen (TYLENOL) 500 MG tablet Take 1 tablet (500 mg total) by mouth every 6 (six) hours as needed for pain. 03/04/13   Junius FinnerErin O'Malley, PA-C  aspirin  81 MG tablet Take 81 mg by mouth daily.    Historical Provider, MD  HYDROcodone-acetaminophen (NORCO/VICODIN) 5-325 MG per tablet Take 1-2 tablets by mouth every 4 (four) hours as needed for moderate pain or severe pain. 09/14/14   Junius FinnerErin O'Malley, PA-C  meloxicam (MOBIC) 7.5 MG tablet Take 1 tablet (7.5 mg total) by mouth daily. Take 1 tablet daily for 5 days, then daily as needed for pain 09/14/14   Junius FinnerErin O'Malley, PA-C  methocarbamol (ROBAXIN) 500 MG tablet Take 1 tablet (500 mg total) by mouth 2 (two) times daily. 09/14/14   Junius FinnerErin O'Malley, PA-C   BP 135/82 mmHg  Pulse 74  Temp(Src) 98.1 F (36.7 C) (Oral)  Resp 16  Ht 5\' 2"  (1.575 m)  Wt 165 lb (74.844 kg)  BMI 30.17 kg/m2  SpO2 98%  LMP 09/04/2014 Physical Exam  Constitutional: She is oriented to person, place, and time. She appears well-developed and well-nourished.  HENT:  Head: Normocephalic and atraumatic.  Eyes: EOM are normal.  Neck: Normal range of motion.  Cardiovascular: Normal rate.   Pulmonary/Chest: Effort normal.  Musculoskeletal: Normal range of motion. She exhibits tenderness.  No midline spinal tenderness; tenderness to left lumbar muscles, left buttock and left lateral thigh; antalgic gait; 5/5 strength in upper and lower extremities bilaterally  Neurological: She is alert and oriented to person, place, and time.  Skin: Skin is warm and dry.  Psychiatric: She has a normal mood and affect. Her  behavior is normal.  Nursing note and vitals reviewed.   ED Course  Procedures (including critical care time) DIAGNOSTIC STUDIES: Oxygen Saturation is 98% on RA, normal by my interpretation.    COORDINATION OF CARE: 9:58 AM Discussed treatment plan with pt at bedside and pt agreed to plan.  Labs Review Labs Reviewed - No data to display  Imaging Review No results found.   EKG Interpretation None      MDM   Final diagnoses:  Left-sided low back pain with left-sided sciatica   Pt c/p left lower back pain with  left sided sciatica. No known falls or injuries. No red flag symptoms.  Do not believe imaging needed at this time. Not concerned for emergent process taking place. Will tx symptomatically as needed for pain. Advised to f/u with PCP next week for recheck of symptoms as needed. Return precautions provided. Pt verbalized understanding and agreement with tx plan.    I personally performed the services described in this documentation, which was scribed in my presence. The recorded information has been reviewed and is accurate.     Junius Finner, PA-C 09/14/14 1035  Glynn Octave, MD 09/14/14 [id number]

## 2014-09-14 NOTE — ED Notes (Signed)
Started 2-3 days ago with LBP radiating to left hip and thigh. Reports "tingling" in posterior left thigh.

## 2014-12-30 NOTE — Discharge Summary (Signed)
PATIENT NAME:  Karina Neal, Karina Neal MR#:  [id number] DATE OF BIRTH:  10/26/1970  DATE OF ADMISSION:  03/02/2013 DATE OF DISCHARGE:  03/03/2013  HISTORY AND PHYSICAL:  For a detailed note, please take a look at the history and physical on admission by Dr. Imogene Burnhen.   DISCHARGE DIAGNOSIS:  Chest pain, likely related to costochondritis.   DISCHARGE INSTRUCTIONS:  1.  The patient is being discharged on a regular diet.  2.  Activity as tolerated.  3.  Followup:  Follow up with her primary care physician in the next 1 to 2 weeks. She needs to get herself a primary care physician as she does not have one.  4.  Discharge medications: Ibuprofen 600 mg q. 8 hours as needed.   BRIEF HOSPITAL COURSE: This is a 44 year old female with medical problems as mentioned above, presented to the hospital with chest pain.   1.  Chest pain. The patient was observed overnight on telemetry for her chest pain, had 3 sets of cardiac markers checked which were negative. She continued to have some chest pain, but it seemed more reproducible in nature consistent with costochondritis. She was started on some p.o. ibuprofen which seems to have alleviated her symptoms. She also underwent an exercise treadmill nuclear stress test which showed no evidence of any acute ischemia. No wall motion abnormalities. Since after getting the Motrin the patient is clinically feeling better, she is being discharged home.  2.  The patient, again, needs to get herself a primary care physician as she does not have one.   TIME SPENT: 35 minutes. ____________________________ Rolly PancakeVivek J. Cherlynn KaiserSainani, MD vjs:cb D: 03/03/2013 14:46:19 ET T: 03/03/2013 20:28:46 ET JOB#: [id number] cc: Rolly PancakeVivek J. Cherlynn KaiserSainani, MD, <Dictator> Houston SirenVIVEK J Ashtan Laton MD ELECTRONICALLY SIGNED 03/12/2013 15:05

## 2014-12-30 NOTE — H&P (Signed)
PATIENT NAME:  Karina Neal, Karina Neal MR#:  [id number] DATE OF BIRTH:  04-24-1971  DATE OF ADMISSION:  03/02/2013  PRIMARY CARE PHYSICIAN:  None.   REFERRING PHYSICIAN:   Dr. Darnelle Catalan.   CHIEF COMPLAINT: Chest pain this afternoon.   HISTORY OF PRESENT ILLNESS: A 44 year old [place] American female with no past medical history, presented to the ED with chest pain this afternoon. The patient started to have chest pain on the left side at about 2:00 p.m. today, which is sharp, 8 out of 10, constant, radiation to the left arm. In addition, the patient has had headache, dizziness The patient came to the ED for further evaluation and was treated with the nitroglycerin but still have chest pain. The patient denies any nausea, vomiting or diaphoresis. No abdominal pain, no cough, sputum, shortness of breath.   PAST MEDICAL HISTORY: No.   PAST SURGICAL HISTORY: Hysterectomy and appendectomy.   FAMILY HISTORY: Grandmother had heart attack and CABG and both parents had hypertension.   SOCIAL HISTORY: Denies any smoking or drinking or illicit drugs.   ALLERGIES: Penicillin, shellfish.   MEDICATIONS: None.   REVIEW OF SYSTEMS:  CONSTITUTIONAL: The patient denies any fever or chills. No headache  but has dizziness,  weakness.  EYES: No double vision or blurred vision.  ENT: No postnasal drip, slurred speech or dysphagia. No epistaxis.  CARDIOVASCULAR: Positive for chest pain. No palpitations, orthopnea,  nocturnal dyspnea. No leg edema.  PULMONARY: No cough, sputum, shortness of breath or hemoptysis.  GASTROINTESTINAL: No abdominal pain, nausea, vomiting, vomiting, or diarrhea. No melena or bloody stool.  GENITOURINARY: No dysuria or hematuria or incontinence.  SKIN: No rash or jaundice.  NEUROLOGY: No syncope, loss of consciousness or seizure.  eczema.  HEMATOLOGY: No easy bruising or bleeding.  ENDOCRINE: No polyuria, polydipsia, heat or cold intolerance.  PSYCHIATRIC: No stress, anxiety or  depression.   PHYSICAL EXAMINATION: VITAL SIGNS: Temperature 98.8, blood pressure 140/77, pulse of 76, oxygen saturation 97% on room air.  GENERAL: The patient is alert, awake, oriented, in no acute distress.  HEENT: Pupils round, equal and reactive to light and accommodation.  NECK: Supple. No JVD or carotid bruit. No lymphadenopathy, no thyromegaly.  Moist oral mucosa. Clear oropharynx.  CARDIOVASCULAR: S1, S2 regular rate, rhythm. No murmurs or gallops.  PULMONARY: Bilateral air entry. No wheezing or rales. No use of accessory muscles to breathe.  ABDOMEN: Soft. No distention or tenderness. No organomegaly. Bowel sounds present.  EXTREMITIES: No edema, clubbing or cyanosis. No calf tenderness. Strong bilateral pedal pulses.  SKIN: No rash or jaundice.  NEUROLOGIC: A and O x 3. No focal deficit. Power 5/5. Sensation intact.   LABORATORY, DIAGNOSTIC AND RADIOLOGIC DATA: CBC normal. Glucose 91, BUN 10, creatinine 0.77, sodium 140, potassium 3.3, chloride 107, bicarbonate 26. Troponin less than 0.02. CK 125, CK-MB 1.1, INR 1.0.   A chest x-ray: No acute cardiopulmonary disease.   Cervical spine: No fracture.   EKG showed normal sinus rhythm at 76 BPM.   IMPRESSIONS: 1.  Chest pain. Need to rule out acute coronary syndrome.  2.  Hypokalemia.   PLAN OF TREATMENT: 1.  The patient will be placed for observation. We will follow up troponin level, get a stress test.  2.  We will start aspirin 325 mg p.o. daily, give Lipitor and start Lopressor, lisinopril low-dose. 3.  We will check lipid panel, TSH.   Discussed the patient's condition and the plan of treatment with the patient and the patient's daughter.  CODE STATUS:  The patient is a full code.   TIME SPENT: 42 minutes    ____________________________ Shaune PollackQing Luree Palla, MD qc:cc D: 03/02/2013 16:20:23 ET T: 03/02/2013 16:37:07 ET JOB#: [id number]  cc: Shaune PollackQing Delma Villalva, MD, <Dictator> Shaune PollackQING Venice Marcucci MD ELECTRONICALLY SIGNED 03/04/2013 15:08

## 2015-04-23 ENCOUNTER — Encounter (HOSPITAL_COMMUNITY): Payer: Self-pay | Admitting: Emergency Medicine

## 2015-04-23 ENCOUNTER — Emergency Department (HOSPITAL_COMMUNITY): Payer: Self-pay

## 2015-04-23 ENCOUNTER — Observation Stay (HOSPITAL_COMMUNITY)
Admission: EM | Admit: 2015-04-23 | Discharge: 2015-04-25 | Disposition: A | Discharge status: Home / Self Care | Payer: Self-pay | Attending: Internal Medicine | Admitting: Internal Medicine

## 2015-04-23 DIAGNOSIS — Z8673 Personal history of transient ischemic attack (TIA), and cerebral infarction without residual deficits: Secondary | ICD-10-CM | POA: Diagnosis present

## 2015-04-23 DIAGNOSIS — E669 Obesity, unspecified: Secondary | ICD-10-CM | POA: Insufficient documentation

## 2015-04-23 DIAGNOSIS — R29898 Other symptoms and signs involving the musculoskeletal system: Secondary | ICD-10-CM | POA: Insufficient documentation

## 2015-04-23 DIAGNOSIS — Z5181 Encounter for therapeutic drug level monitoring: Secondary | ICD-10-CM | POA: Insufficient documentation

## 2015-04-23 DIAGNOSIS — G43909 Migraine, unspecified, not intractable, without status migrainosus: Secondary | ICD-10-CM | POA: Insufficient documentation

## 2015-04-23 DIAGNOSIS — Z79899 Other long term (current) drug therapy: Secondary | ICD-10-CM | POA: Insufficient documentation

## 2015-04-23 DIAGNOSIS — I639 Cerebral infarction, unspecified: Principal | ICD-10-CM

## 2015-04-23 DIAGNOSIS — R200 Anesthesia of skin: Secondary | ICD-10-CM

## 2015-04-23 DIAGNOSIS — Z6834 Body mass index (BMI) 34.0-34.9, adult: Secondary | ICD-10-CM | POA: Insufficient documentation

## 2015-04-23 DIAGNOSIS — Z91013 Allergy to seafood: Secondary | ICD-10-CM | POA: Insufficient documentation

## 2015-04-23 DIAGNOSIS — Z880 Allergy status to penicillin: Secondary | ICD-10-CM | POA: Insufficient documentation

## 2015-04-23 DIAGNOSIS — G43109 Migraine with aura, not intractable, without status migrainosus: Secondary | ICD-10-CM | POA: Insufficient documentation

## 2015-04-23 HISTORY — DX: Anesthesia of skin: R20.0

## 2015-04-23 LAB — I-STAT CHEM 8, ED
BUN: 11 mg/dL (ref 6–20)
Calcium, Ion: 1.21 mmol/L (ref 1.12–1.23)
Chloride: 101 mmol/L (ref 101–111)
Creatinine, Ser: 0.9 mg/dL (ref 0.44–1.00)
Glucose, Bld: 102 mg/dL — ABNORMAL HIGH (ref 65–99)
HCT: 38 % (ref 36.0–46.0)
Hemoglobin: 12.9 g/dL (ref 12.0–15.0)
Potassium: 3.8 mmol/L (ref 3.5–5.1)
Sodium: 141 mmol/L (ref 135–145)
TCO2: 24 mmol/L (ref 0–100)

## 2015-04-23 LAB — URINALYSIS, ROUTINE W REFLEX MICROSCOPIC
Bilirubin Urine: NEGATIVE
Glucose, UA: NEGATIVE mg/dL
Hgb urine dipstick: NEGATIVE
Ketones, ur: NEGATIVE mg/dL
Leukocytes, UA: NEGATIVE
Nitrite: NEGATIVE
Protein, ur: NEGATIVE mg/dL
Specific Gravity, Urine: 1.021 (ref 1.005–1.030)
Urobilinogen, UA: 1 mg/dL (ref 0.0–1.0)
pH: 7 (ref 5.0–8.0)

## 2015-04-23 LAB — CBC
HCT: 36.1 % (ref 36.0–46.0)
Hemoglobin: 12.5 g/dL (ref 12.0–15.0)
MCH: 30.5 pg (ref 26.0–34.0)
MCHC: 34.6 g/dL (ref 30.0–36.0)
MCV: 88 fL (ref 78.0–100.0)
Platelets: 136 10*3/uL — ABNORMAL LOW (ref 150–400)
RBC: 4.1 MIL/uL (ref 3.87–5.11)
RDW: 13.1 % (ref 11.5–15.5)
WBC: 7.5 10*3/uL (ref 4.0–10.5)

## 2015-04-23 LAB — DIFFERENTIAL
Basophils Absolute: 0 10*3/uL (ref 0.0–0.1)
Basophils Relative: 0 % (ref 0–1)
Eosinophils Absolute: 0.2 10*3/uL (ref 0.0–0.7)
Eosinophils Relative: 2 % (ref 0–5)
Lymphocytes Relative: 37 % (ref 12–46)
Lymphs Abs: 2.8 10*3/uL (ref 0.7–4.0)
Monocytes Absolute: 0.4 10*3/uL (ref 0.1–1.0)
Monocytes Relative: 6 % (ref 3–12)
Neutro Abs: 4.2 10*3/uL (ref 1.7–7.7)
Neutrophils Relative %: 55 % (ref 43–77)

## 2015-04-23 LAB — COMPREHENSIVE METABOLIC PANEL
ALT: 13 U/L — ABNORMAL LOW (ref 14–54)
AST: 19 U/L (ref 15–41)
Albumin: 3.6 g/dL (ref 3.5–5.0)
Alkaline Phosphatase: 48 U/L (ref 38–126)
Anion gap: 7 (ref 5–15)
BUN: 9 mg/dL (ref 6–20)
CO2: 27 mmol/L (ref 22–32)
Calcium: 8.7 mg/dL — ABNORMAL LOW (ref 8.9–10.3)
Chloride: 104 mmol/L (ref 101–111)
Creatinine, Ser: 0.84 mg/dL (ref 0.44–1.00)
GFR calc Af Amer: >60 mL/min (ref >60)
GFR calc non Af Amer: >60 mL/min (ref >60)
Glucose, Bld: 105 mg/dL — ABNORMAL HIGH (ref 65–99)
Potassium: 3.7 mmol/L (ref 3.5–5.1)
Sodium: 138 mmol/L (ref 135–145)
Total Bilirubin: 0.4 mg/dL (ref 0.3–1.2)
Total Protein: 6.6 g/dL (ref 6.5–8.1)

## 2015-04-23 LAB — RAPID URINE DRUG SCREEN, HOSP PERFORMED
Amphetamines: NOT DETECTED
Barbiturates: NOT DETECTED
Benzodiazepines: NOT DETECTED
Cocaine: NOT DETECTED
Opiates: NOT DETECTED
Tetrahydrocannabinol: NOT DETECTED

## 2015-04-23 LAB — PROTIME-INR
INR: 1.04 (ref 0.00–1.49)
Prothrombin Time: 13.8 seconds (ref 11.6–15.2)

## 2015-04-23 LAB — I-STAT TROPONIN, ED: Troponin i, poc: 0 ng/mL (ref 0.00–0.08)

## 2015-04-23 LAB — APTT: aPTT: 30 seconds (ref 24–37)

## 2015-04-23 LAB — ETHANOL: Alcohol, Ethyl (B): <5 mg/dL (ref <5)

## 2015-04-23 MED ORDER — HEPARIN SODIUM (PORCINE) 5000 UNIT/ML IJ SOLN
5000.0000 [IU] | Freq: Three times a day (TID) | INTRAMUSCULAR | Status: DC
  Administered 2015-04-24 – 2015-04-25 (×5): 5000 [IU] via SUBCUTANEOUS
  Filled 2015-04-23 (×6): qty 1

## 2015-04-23 MED ORDER — ASPIRIN 300 MG RE SUPP
300.0000 mg | Freq: Every day | RECTAL | Status: DC
  Filled 2015-04-23: qty 1

## 2015-04-23 MED ORDER — SENNOSIDES-DOCUSATE SODIUM 8.6-50 MG PO TABS
1.0000 | ORAL_TABLET | Freq: Every evening | ORAL | Status: DC | PRN
  Filled 2015-04-23: qty 1

## 2015-04-23 MED ORDER — STROKE: EARLY STAGES OF RECOVERY BOOK
Freq: Once | Status: AC
  Administered 2015-04-24: 01:00:00

## 2015-04-23 MED ORDER — ASPIRIN 325 MG PO TABS
325.0000 mg | ORAL_TABLET | Freq: Every day | ORAL | Status: DC
  Administered 2015-04-24 – 2015-04-25 (×2): 325 mg via ORAL
  Filled 2015-04-23 (×2): qty 1

## 2015-04-23 MED ORDER — SODIUM CHLORIDE 0.9 % IV SOLN
INTRAVENOUS | Status: DC
  Administered 2015-04-24: 01:00:00 via INTRAVENOUS
  Filled 2015-04-23: qty 1000

## 2015-04-23 NOTE — ED Notes (Signed)
Pt arrived to room B17 from CT with Rapid response RN and ED RN MIZE

## 2015-04-23 NOTE — ED Provider Notes (Signed)
CSN: [id number]     Arrival date & time 04/23/15  2123 History   First MD Initiated Contact with Patient 04/23/15 2131     Chief Complaint  Patient presents with  . Code Stroke    (Consider location/radiation/quality/duration/timing/severity/associated sxs/prior Treatment) The history is provided by the patient and the spouse.   patient presents today with new onset left-sided numbness and tingling that started around 7 PM today. Patient relates she was lying in bed at 7 PM today when she began to feel numbness and tingling in her left upper extremities and lower extremities. She also notes similar symptoms in the left side of her face as well. She denies any previous history. As far as associated symptoms the patient denies any chest pain, shortness of breath, nausea, vomiting, diarrhea. She denies any pain or swelling in her lower extremities bilaterally. She does note that she feels slightly weak in her left arm and left leg as well. She denies any recent changes in medications or injuries. 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 They shouldn't is unaware of any worsening or alleviating factors associated with today's illness.  History reviewed. No pertinent past medical history. Past Surgical History  Procedure Laterality Date  . Cesarean section    . Eye surgery    . Appendectomy     Family History  Problem Relation Age of Onset  . Hypertension Mother   . Hypertension Father   . Hypertension Sister   . Hypertension Brother   . Hyperlipidemia Mother    Social History  Substance Use Topics  . Smoking status: Never Smoker   . Smokeless tobacco: None  . Alcohol Use: No   OB History    No data available     Review of Systems  Constitutional: Negative for fever and chills.  HENT: Negative for congestion and rhinorrhea.   Eyes: Negative for photophobia and visual  disturbance.  Respiratory: Negative for shortness of breath and wheezing.   Cardiovascular: Negative for chest pain and leg swelling.  Gastrointestinal: Negative for nausea, vomiting and abdominal pain.  Genitourinary: Negative for dysuria and hematuria.  Musculoskeletal: Negative for myalgias and arthralgias.  Neurological: Positive for weakness (Mild left arm and left leg) and numbness (left face and left arm and left leg). Negative for dizziness, syncope, facial asymmetry, speech difficulty and light-headedness.  Psychiatric/Behavioral: Negative for confusion and agitation.  All other systems reviewed and are negative.     Allergies  Shellfish allergy and Penicillins  Home Medications   Prior to Admission medications   Medication Sig Start Date End Date Taking? Authorizing Provider  acetaminophen (TYLENOL) 500 MG tablet Take 1 tablet (500 mg total) by mouth every 6 (six) hours as needed for pain. 03/04/13   Junius Finner, PA-C  HYDROcodone-acetaminophen (NORCO/VICODIN) 5-325 MG per tablet Take 1-2 tablets by mouth every 4 (four) hours as needed for moderate pain or severe pain. Patient not taking: Reported on 04/23/2015 09/14/14   Junius Finner, PA-C  meloxicam (MOBIC) 7.5 MG tablet Take 1 tablet (7.5 mg total) by mouth daily. Take 1 tablet daily for 5 days, then daily as needed for pain Patient not taking: Reported on 04/23/2015 09/14/14   Junius Finner, PA-C  methocarbamol (ROBAXIN) 500 MG tablet Take 1 tablet (500 mg total) by mouth 2 (two) times daily. Patient not taking: Reported on 04/23/2015 09/14/14   Denny Peon  O'Malley, PA-C   BP 135/88 mmHg  Pulse 74  Temp(Src) 99.1 F (37.3 C) (Oral)  Resp 18  Ht 5\' 3"  (1.6 m)  Wt 196 lb 9.6 oz (89.177 kg)  BMI 34.83 kg/m2  SpO2 97% Physical Exam  Constitutional: She is oriented to person, place, and time. No distress.  HENT:  Head: Normocephalic and atraumatic.  Eyes: Conjunctivae and EOM are normal. Pupils are equal, round, and reactive to  light.  Neck: Normal range of motion. Neck supple.  Cardiovascular: Normal rate, regular rhythm and normal heart sounds.   No murmur heard. Pulmonary/Chest: Effort normal and breath sounds normal.  Abdominal: Soft. Bowel sounds are normal. There is no tenderness. There is no rebound and no guarding.  Musculoskeletal: Normal range of motion. She exhibits no tenderness.  Neurological: She is alert and oriented to person, place, and time. She has normal strength. A cranial nerve deficit (Diminished sensation on the left side of face ) and sensory deficit (Diminished sensation in left arm and left leg.) is present. GCS eye subscore is 4. GCS verbal subscore is 5. GCS motor subscore is 6.  5 out of 5 strength throughout.   Skin: Skin is warm and dry. She is not diaphoretic.  Psychiatric: She has a normal mood and affect. Her behavior is normal.  Nursing note and vitals reviewed.   ED Course  Procedures (including critical care time) Labs Review Labs Reviewed  CBC - Abnormal; Notable for the following:    Platelets 136 (*)    All other components within normal limits  COMPREHENSIVE METABOLIC PANEL - Abnormal; Notable for the following:    Glucose, Bld 105 (*)    Calcium 8.7 (*)    ALT 13 (*)    All other components within normal limits  URINALYSIS, ROUTINE W REFLEX MICROSCOPIC (NOT AT [facility]) - Abnormal; Notable for the following:    APPearance HAZY (*)    All other components within normal limits  I-STAT CHEM 8, ED - Abnormal; Notable for the following:    Glucose, Bld 102 (*)    All other components within normal limits  ETHANOL  PROTIME-INR  APTT  DIFFERENTIAL  URINE RAPID DRUG SCREEN, HOSP PERFORMED  HCG, QUANTITATIVE, PREGNANCY  ANTINUCLEAR ANTIBODIES, IFA  SEDIMENTATION RATE  C-REACTIVE PROTEIN  ANTITHROMBIN III  PROTEIN C ACTIVITY  PROTEIN C, TOTAL  PROTEIN S ACTIVITY  PROTEIN S, TOTAL  LUPUS ANTICOAGULANT PANEL  BETA-2-GLYCOPROTEIN I ABS, IGG/M/A  HOMOCYSTEINE   FACTOR 5 LEIDEN  PROTHROMBIN GENE MUTATION  CARDIOLIPIN ANTIBODIES, IGG, IGM, IGA  HIV ANTIBODY (ROUTINE TESTING)  TSH  HEMOGLOBIN A1C  LIPID PANEL  RPR  I-STAT TROPOININ, ED    Imaging Review Ct Head Wo Contrast  04/23/2015   CLINICAL DATA:  Code stroke. Right arm numbness, acute onset. Initial encounter.  EXAM: CT HEAD WITHOUT CONTRAST  TECHNIQUE: Contiguous axial images were obtained from the base of the skull through the vertex without intravenous contrast.  COMPARISON:  CT of the head performed 04/06/2014  FINDINGS: There is no evidence of acute infarction, mass lesion, or intra- or extra-axial hemorrhage on CT.  The posterior fossa, including the cerebellum, brainstem and fourth ventricle, is within normal limits. The third and lateral ventricles, and basal ganglia are unremarkable in appearance. The cerebral hemispheres are symmetric in appearance, with normal gray-white differentiation. No mass effect or midline shift is seen.  There is no evidence of fracture; visualized osseous structures are unremarkable in appearance. The patient is status post left-sided scleral  banding. The right orbit is within normal limits. The paranasal sinuses and mastoid air cells are well-aerated. No significant soft tissue abnormalities are seen.  IMPRESSION: Unremarkable noncontrast CT of the head.  These results were called by telephone at the time of interpretation on 04/23/2015 at 10:01 pm to Dr. Amada Jupiter, who verbally acknowledged these results.   Electronically Signed   By: Roanna Raider M.D.   On: 04/23/2015 22:01   I, Madolyn Frieze, personally reviewed and evaluated these images and lab results as part of my medical decision-making.   EKG Interpretation None      MDM   Final diagnoses:  Left sided numbness    Patient presents today with new onset of left-sided diminished sensation that started around 7 PM today. On exam patient does have stated left side sensation decrease. Strength seems  to be preserved.vital signs otherwise are stable. Troponin is negative. Neurology relates they're concerned this could be a thalamic stroke although mild. They recommended admission to the hospitalist service for further evaluation and management. Discussed this plan with the patient and they're agreeable. Patient was stable and in good condition at the time of transfer to the hospitalist service.    Madolyn Frieze, MD 04/24/15 1610  Zadie Rhine, MD 04/24/15 [phone]

## 2015-04-23 NOTE — Code Documentation (Signed)
Code stroke called at 2130 for this black female patient who was last seen well at 1930 hrs.  Pt was at home in bed when she developed sudden onset left facial and left arm numbness,  and decreased sensation to left face, LUE and LLE.  Was brought to ED in private vehicle arriving at 2123 hrs in triage. I was in ED at time of her arrival.  Cleared for CT by Dr Donetta Potts at 2128. NIHSS scored a 1 for partial sensory loss to left face, LUE and LLE. No c/o pain,  Grips strong and equal bilataerally.  Speech is fluent.  Pt arrived CT at 2132, Dr Amada Jupiter arrived at 2133.  Unremarkable head CT results were called to Dr Amada Jupiter at  2201.  Nao TPA given at this point as symptoms are mild.  Pt will remain in window for treatment until 2400 hrs.  2345  NIHSS remains 1.  Admitted to 4N03 medicine service.  Will follow as needed

## 2015-04-23 NOTE — ED Provider Notes (Signed)
ED ECG REPORT   Date: 04/23/2015 2150  Rate: 88  Rhythm: normal sinus rhythm  QRS Axis: right  Intervals: normal  ST/T Wave abnormalities: nonspecific ST changes  Conduction Disutrbances:none Artifact noted   Zadie Rhine, MD 04/23/15 2208

## 2015-04-23 NOTE — H&P (Signed)
[facility] History and Physical  Karina Neal MRN:[id number] DOB: 12/03/1970 DOA: 04/23/2015  Referring physician: ED physician PCP: No primary care provider on file.  Specialists:   Chief Complaint:  left arm and face numbness  HPI: Karina Neal is a 44 y.o. female Without significant past medical history,  who presents with left arm and face numbness.  Patient reports that she has been generally healthy until 7:30 PM when she started having left arm and face numbness. She also has very minimal weakness in left arm, no vision change, hearing loss. Patient does not have chest pain, shortness breath, cough, abdominal pain, diarrhea, UTI symptoms, rashes.   In ED, patient was found to have WBC 7.5, INR 1.04, PTT 30, troponin negative, no tachycardia, electrolytes okay. CT head is negative for acute abnormalities. Patient is admitted to inpatient for further evaluation and treatment. Neurology was consulted.  Where does patient live?   At home Can patient participate in ADLs?  Yes   Review of Systems:   General: no fevers, chills, no changes in body weight, has fatigue HEENT: no blurry vision, hearing changes or sore throat Pulm: no dyspnea, coughing, wheezing CV: no chest pain, palpitations Abd: no nausea, vomiting, abdominal pain, diarrhea, constipation GU: no dysuria, burning on urination, increased urinary frequency, hematuria  Ext: no leg edema Neuro: Has numbness over left arm and face, and mild weakness over the left arm. no vision change or hearing loss Skin: no rash MSK: No muscle spasm, no deformity, no limitation of range of movement in spin Heme: No easy bruising.  Travel history: No recent long distant travel.  Allergy:  Allergies  Allergen Reactions  . Shellfish Allergy Anaphylaxis    Throat swelling, dyspnea  . Penicillins     Hives     History reviewed. No pertinent past medical history.  Past Surgical History  Procedure Laterality Date   . Cesarean section    . Eye surgery    . Appendectomy      Social History:  reports that she has never smoked. She does not have any smokeless tobacco history on file. She reports that she does not drink alcohol or use illicit drugs.  Family History:  Family History  Problem Relation Age of Onset  . Hypertension Mother   . Hypertension Father   . Hypertension Sister   . Hypertension Brother   . Hyperlipidemia Mother      Prior to Admission medications   Medication Sig Start Date End Date Taking? Authorizing Provider  acetaminophen (TYLENOL) 500 MG tablet Take 1 tablet (500 mg total) by mouth every 6 (six) hours as needed for pain. 03/04/13   Erin O'Malley, PA-C  HYDROcodone-acetaminophen (NORCO/VICODIN) 5-325 MG per tablet Take 1-2 tablets by mouth every 4 (four) hours as needed for moderate pain or severe pain. Patient not taking: Reported on 04/23/2015 09/14/14   Erin O'Malley, PA-C  meloxicam (MOBIC) 7.5 MG tablet Take 1 tablet (7.5 mg total) by mouth daily. Take 1 tablet daily for 5 days, then daily as needed for pain Patient not taking: Reported on 04/23/2015 09/14/14   Erin O'Malley, PA-C  methocarbamol (ROBAXIN) 500 MG tablet Take 1 tablet (500 mg total) by mouth 2 (two) times daily. Patient not taking: Reported on 04/23/2015 09/14/14   Erin O'Malley, PA-C    Physical Exam: Filed Vitals:   04/23/15 2245 04/23/15 2248 04/23/15 2249 04/23/15 2300  BP: 122/74 122/74  137/83  Pulse: 71  79 66  Temp: 98.4 F (36.9   C)     TempSrc: Oral     Resp: _0 SpO2: 98%  99% 97%   General: Not in acute distress HEENT:       Eyes: PERRL, EOMI, no scleral icterus.       ENT: No discharge from the ears and nose, no pharynx injection, no tonsillar enlargement.        Neck: No JVD, no bruit, no mass felt. Heme: No neck lymph node enlargement. Cardiac: S1/S2, RRR, No murmurs, No gallops or rubs. Pulm: No rales, wheezing, rhonchi or rubs. Abd: Soft, nondistended, nontender, no rebound  pain, no organomegaly, BS present. Ext: No pitting leg edema bilaterally. 2+DP/PT pulse bilaterally. Musculoskeletal: No joint deformities, No joint redness or warmth, no limitation of ROM in spin. Skin: No rashes.  Neuro: Alert, oriented X3, cranial nerves II-XII grossly intact, muscle strength 4/5 in left arm and 5/5 in all other extremities, sensation to light touch intact. Brachial reflex 2+ bilaterally. Knee reflex 1+ bilaterally. Negative Babinski's sign. Normal finger to nose test. Psych: Patient is not psychotic, no suicidal or hemocidal ideation.  Labs on Admission:  Basic Metabolic Panel:  Recent Labs Lab 04/23/15 2140 04/23/15 2142  NA 138 141  K 3.7 3.8  CL 104 101  CO2 27  --   GLUCOSE 105* 102*  BUN 9 11  CREATININE 0.84 0.90  CALCIUM 8.7*  --    Liver Function Tests:  Recent Labs Lab 04/23/15 2140  AST 19  ALT 13*  ALKPHOS 48  BILITOT 0.4  PROT 6.6  ALBUMIN 3.6   No results for input(s): LIPASE, AMYLASE in the last 168 hours. No results for input(s): AMMONIA in the last 168 hours. CBC:  Recent Labs Lab 04/23/15 2140 04/23/15 2142  WBC 7.5  --   NEUTROABS 4.2  --   HGB 12.5 12.9  HCT 36.1 38.0  MCV 88.0  --   PLT 136*  --    Cardiac Enzymes: No results for input(s): CKTOTAL, CKMB, CKMBINDEX, TROPONINI in the last 168 hours.  BNP (last 3 results) No results for input(s): BNP in the last 8760 hours.  ProBNP (last 3 results) No results for input(s): PROBNP in the last 8760 hours.  CBG: No results for input(s): GLUCAP in the last 168 hours.  Radiological Exams on Admission: Ct Head Wo Contrast  04/23/2015   CLINICAL DATA:  Code stroke. Right arm numbness, acute onset. Initial encounter.  EXAM: CT HEAD WITHOUT CONTRAST  TECHNIQUE: Contiguous axial images were obtained from the base of the skull through the vertex without intravenous contrast.  COMPARISON:  CT of the head performed 04/06/2014  FINDINGS: There is no evidence of acute infarction,  mass lesion, or intra- or extra-axial hemorrhage on CT.  The posterior fossa, including the cerebellum, brainstem and fourth ventricle, is within normal limits. The third and lateral ventricles, and basal ganglia are unremarkable in appearance. The cerebral hemispheres are symmetric in appearance, with normal gray-white differentiation. No mass effect or midline shift is seen.  There is no evidence of fracture; visualized osseous structures are unremarkable in appearance. The patient is status post left-sided scleral banding. The right orbit is within normal limits. The paranasal sinuses and mastoid air cells are well-aerated. No significant soft tissue abnormalities are seen.  IMPRESSION: Unremarkable noncontrast CT of the head.  These results were called by telephone at the time of interpretation on 04/23/2015 at 10:01 pm to Dr. Leonel Ramsay, who verbally acknowledged these results.   Electronically  Signed   By: Garald Balding M.D.   On: 04/23/2015 22:01    EKG: Independently reviewed.  Abnormal findings: Mild T-wave inversion in lead 3, aVF, and V3-V6 (patient had similar EKG change on 05/21/14)  Assessment/Plan Principal Problem:   Left sided numbness Active Problems:   Stroke  Left sided numbness: Patient's symptoms are consistent with stroke. CT head is negative for acute abnormalities. Patient does not have significant risk factors.Neurology was consulted, Dr. Leonel Ramsay saw patient. -will admit to tele bed -Appreciate Dr. Leonel Ramsay consultation, with follow-up recommendations as follows:  1. HgbA1c, fasting lipid panel 2. MRI, MRA of the brain without contrast 3. Frequent neuro checks 4. Echocardiogram 5. Carotid dopplers 6. Prophylactic therapy-Antiplatelet med: Aspirin - dose 374m PO or 3040mPR 7. Risk factor modification 8. Telemetry monitoring 9. PT consult, OT consult, Speech consult  - will check UDS, RPR, ESR, CRP, HIV antibody, ANA, hypercoagulable panel -check preg  test   DVT ppx: SQ Heparin  Code Status: Full code Family Communication:  Yes, patient's fianc at bed side Disposition Plan: Admit to inpatient   Date of Service 04/23/2015    N[facility] Pager [phone] 7PM-7AM, please contact night-coverage [email or web address] Password [email or web address]/14/2016, 11:17 PM

## 2015-04-23 NOTE — ED Notes (Signed)
Pt arrived ambulatory at triage with c/o L sided facial and L arm numbness, tingling, and weakness.  States she had difficulty opening a slim Jim with L hand.  Speech clear. Grips strong and equal, no arm drift noted on triage exam.  Pt states decreased sensation on L arm.  Last Known Well 1930.  Code Stroke activated and pt straight to bridge for Dr. Bebe Shaggy to assess airway.  Care handoff to Huntley Dec, RN and Gunnar Fusi, Rapid Response RN.

## 2015-04-23 NOTE — ED Provider Notes (Signed)
Patient seen/examined in the Emergency Department in conjunction with Resident Physician Provider  Patient reports left sided numbness Exam : pt awake/alert, speech is fluent currently.  Plan: d/w dr Amada Jupiter tPA in stroke considered but not given due to: Rapid improvement/severity mild per dr Jacklynn Bue, MD 04/23/15 2207

## 2015-04-23 NOTE — Consult Note (Signed)
Neurology Consultation Reason for Consult: Left sided numbness Referring Physician: Bebe Shaggy, D  CC: Left-sided numbness  History is obtained from: Patient  HPI: Sherryn Dorise Hiss is a 44 y.o. female with no known past medical history who presents with left arm and face numbness was acute onset 7:30 PM. She states that afterwards, she took a hot bath hoping it would go away, but it was still present and therefore came into the emergency room. Code stroke was activated and TPA was not administered due to mild symptoms.  She felt like she had mild weakness, but has not dropped any objects or difficulty with using her hand. She denies any vision change. She denies headache. She denies any other symptoms. She has never had anything like this before.  LKW: 7:30 PM tpa given?: no, mild symptoms    ROS: A 14 point ROS was performed and is negative except as noted in the HPI.   PMH: None   FH: dm htn   Social History:  reports that she has never smoked. She does not have any smokeless tobacco history on file. She reports that she does not drink alcohol or use illicit drugs.   Exam: Current vital signs: BP 147/83 mmHg  Pulse 75  Resp 18  SpO2 98% Vital signs in last 24 hours: Pulse Rate:  [75] 75 (08/14 2200) Resp:  [18] 18 (08/14 2200) BP: (147)/(83) 147/83 mmHg (08/14 2200) SpO2:  [98 %] 98 % (08/14 2200)   Physical Exam  Constitutional: Appears well-developed and well-nourished.  Psych: Affect appropriate to situation Eyes: No scleral injection HENT: No OP obstrucion Head: Normocephalic.  Cardiovascular: Normal rate and regular rhythm.  Respiratory: Effort normal and breath sounds normal to anterior ascultation GI: Soft.  No distension. There is no tenderness.  Skin: WDI  Neuro: Mental Status: Patient is awake, alert, oriented to person, place, month, year, and situation. Patient is able to give a clear and coherent history. No signs of aphasia or  neglect Cranial Nerves: II: Visual Fields are full. Pupils are equal, round, and reactive to light.   III,IV, VI: EOMI without ptosis or diploplia.  V: Facial sensation is symmetric to temperature VII: Facial movement is symmetric.  VIII: hearing is intact to voice X: Uvula elevates symmetrically XI: Shoulder shrug is symmetric. XII: tongue is midline without atrophy or fasciculations.  Motor: Tone is normal. Bulk is normal. 5/5 strength was present in all four extremities.  Sensory: Sensation is symmetric to light touch and temperature in the arms and legs. Deep Tendon Reflexes: 2+ and symmetric in the biceps and patellae.  Cerebellar: FNF and HKS are intact bilaterally     I have reviewed labs in epic and the results pertinent to this consultation are: CMP-unremarkable  I have reviewed the images obtained: CT head-unremarkable  Impression: 44 year old female with acute onset left face and arm numbness. Given the acuity of the onset, I feel that the most likely diagnosis would be that she has had a small stroke. She will need to be admitted for further workup.  Recommendations: 1. HgbA1c, fasting lipid panel 2. MRI, MRA  of the brain without contrast 3. Frequent neuro checks 4. Echocardiogram 5. Carotid dopplers 6. Prophylactic therapy-Antiplatelet med: Aspirin - dose  PO or  PR 7. Risk factor modification 8. Telemetry monitoring 9. PT consult, OT consult, Speech consult    Ritta Slot, MD [facility] [phone]  If 7pm- 7am, please page neurology on call as listed in AMION.

## 2015-04-23 NOTE — Progress Notes (Signed)
Patient arrived to [id number]. Assessment completed, patient denies pain, questions answered. Staff will continue to monitor. Lamonte Richer, RN

## 2015-04-23 NOTE — ED Notes (Signed)
Neurology MD at bedside

## 2015-04-24 ENCOUNTER — Inpatient Hospital Stay (HOSPITAL_COMMUNITY): Payer: Self-pay

## 2015-04-24 ENCOUNTER — Encounter (HOSPITAL_COMMUNITY): Payer: Managed Care, Other (non HMO)

## 2015-04-24 DIAGNOSIS — G43909 Migraine, unspecified, not intractable, without status migrainosus: Secondary | ICD-10-CM | POA: Insufficient documentation

## 2015-04-24 DIAGNOSIS — G43109 Migraine with aura, not intractable, without status migrainosus: Secondary | ICD-10-CM | POA: Insufficient documentation

## 2015-04-24 LAB — HIV ANTIBODY (ROUTINE TESTING W REFLEX): HIV Screen 4th Generation wRfx: NONREACTIVE

## 2015-04-24 LAB — LIPID PANEL
Cholesterol: 155 mg/dL (ref 0–200)
HDL: 47 mg/dL (ref >40)
LDL Cholesterol: 93 mg/dL (ref 0–99)
Total CHOL/HDL Ratio: 3.3 RATIO
Triglycerides: 76 mg/dL (ref <150)
VLDL: 15 mg/dL (ref 0–40)

## 2015-04-24 LAB — RPR: RPR Ser Ql: NONREACTIVE

## 2015-04-24 LAB — TSH: TSH: 2.479 u[IU]/mL (ref 0.350–4.500)

## 2015-04-24 LAB — ANTITHROMBIN III: AntiThromb III Func: 90 % (ref 75–120)

## 2015-04-24 LAB — SEDIMENTATION RATE: Sed Rate: 7 mm/hr (ref 0–22)

## 2015-04-24 LAB — HCG, QUANTITATIVE, PREGNANCY: hCG, Beta Chain, Quant, S: <1 m[IU]/mL (ref <5)

## 2015-04-24 LAB — C-REACTIVE PROTEIN: CRP: <0.5 mg/dL (ref <1.0)

## 2015-04-24 NOTE — Progress Notes (Signed)
[facility] PROGRESS NOTE  Karina Neal ZOX:[id number] DOB: 1971/05/30 DOA: 04/23/2015 PCP: No primary care provider on file.  Assessment/Plan:  Principal Problem:   Left sided numbness: MRI negative. Await echo and carotid dopplers. D/w Dr. Roda Shutters. Could be complicated migraine  HPI/Subjective: No  Numbness. Had ha now gone  Objective: Filed Vitals:   04/24/15 2134  BP: 100/56  Pulse: 71  Temp: 97.5 F (36.4 C)  Resp: 18   No intake or output data in the 24 hours ending 04/24/15 2226 Filed Weights   04/23/15 2359  Weight: 89.177 kg (196 lb 9.6 oz)    Exam:   General:  A and o  Cardiovascular: RRR  Respiratory: CTA  Abdomen: S, nt, nd  Ext: no CCE  Neuro nonfocal  Basic Metabolic Panel:  Recent Labs Lab 04/23/15 2140 04/23/15 2142  NA 138 141  K 3.7 3.8  CL 104 101  CO2 27  --   GLUCOSE 105* 102*  BUN 9 11  CREATININE 0.84 0.90  CALCIUM 8.7*  --    Liver Function Tests:  Recent Labs Lab 04/23/15 2140  AST 19  ALT 13*  ALKPHOS 48  BILITOT 0.4  PROT 6.6  ALBUMIN 3.6   No results for input(s): LIPASE, AMYLASE in the last 168 hours. No results for input(s): AMMONIA in the last 168 hours. CBC:  Recent Labs Lab 04/23/15 2140 04/23/15 2142  WBC 7.5  --   NEUTROABS 4.2  --   HGB 12.5 12.9  HCT 36.1 38.0  MCV 88.0  --   PLT 136*  --    Cardiac Enzymes: No results for input(s): CKTOTAL, CKMB, CKMBINDEX, TROPONINI in the last 168 hours. BNP (last 3 results) No results for input(s): BNP in the last 8760 hours.  ProBNP (last 3 results) No results for input(s): PROBNP in the last 8760 hours.  CBG: No results for input(s): GLUCAP in the last 168 hours.  No results found for this or any previous visit (from the past 240 hour(s)).   Studies: Ct Head Wo Contrast  04/23/2015   CLINICAL DATA:  Code stroke. Right arm numbness, acute onset. Initial encounter.  EXAM: CT HEAD WITHOUT CONTRAST  TECHNIQUE: Contiguous axial images  were obtained from the base of the skull through the vertex without intravenous contrast.  COMPARISON:  CT of the head performed 04/06/2014  FINDINGS: There is no evidence of acute infarction, mass lesion, or intra- or extra-axial hemorrhage on CT.  The posterior fossa, including the cerebellum, brainstem and fourth ventricle, is within normal limits. The third and lateral ventricles, and basal ganglia are unremarkable in appearance. The cerebral hemispheres are symmetric in appearance, with normal gray-white differentiation. No mass effect or midline shift is seen.  There is no evidence of fracture; visualized osseous structures are unremarkable in appearance. The patient is status post left-sided scleral banding. The right orbit is within normal limits. The paranasal sinuses and mastoid air cells are well-aerated. No significant soft tissue abnormalities are seen.  IMPRESSION: Unremarkable noncontrast CT of the head.  These results were called by telephone at the time of interpretation on 04/23/2015 at 10:01 pm to Dr. Amada Jupiter, who verbally acknowledged these results.   Electronically Signed   By: Roanna Raider M.D.   On: 04/23/2015 22:01   Mr Brain Wo Contrast  04/24/2015   CLINICAL DATA:  Acute onset LEFT arm and face numbness at 7:30 p.m. yesterday. Assess stroke.  EXAM: MRI HEAD WITHOUT CONTRAST  MRA HEAD WITHOUT CONTRAST  TECHNIQUE: Multiplanar, multiecho pulse sequences of the brain and surrounding structures were obtained without intravenous contrast. Angiographic images of the head were obtained using MRA technique without contrast.  COMPARISON:  CT head April 23, 2015  FINDINGS: MRI HEAD FINDINGS  The ventricles and sulci are normal for patient's age. No abnormal parenchymal signal, mass lesions, mass effect. No reduced diffusion to suggest acute ischemia. No susceptibility artifact to suggest hemorrhage.  No abnormal extra-axial fluid collections. No extra-axial masses though, contrast enhanced  sequences would be more sensitive. Normal major intracranial vascular flow voids seen at the skull base.  Status post LEFT ocular globe scleral banding, ocular globes and orbital contents otherwise unremarkable. No abnormal sellar expansion. Trace paranasal sinus mucosal thickening without air-fluid levels. The mastoid air cells are well aerated. No suspicious calvarial bone marrow signal. No abnormal sellar expansion. Craniocervical junction maintained.  MRA HEAD FINDINGS  Anterior circulation: Normal flow related enhancement of the included cervical, petrous, cavernous and supra clinoid internal carotid arteries. Patent anterior communicating artery. Normal flow related enhancement of the anterior and middle cerebral arteries, including more distal segments.  No large vessel occlusion, high-grade stenosis, abnormal luminal irregularity, aneurysm.  Posterior circulation: LEFT vertebral artery is dominant. RIGHT vertebral artery terminates in the posterior inferior cerebellar artery. Small bilateral posterior communicating arteries are present. Basilar artery is patent, with normal flow related enhancement of the main branch vessels. Normal flow related enhancement of the posterior cerebral arteries.  No large vessel occlusion, high-grade stenosis, abnormal luminal irregularity, aneurysm.  IMPRESSION: MRI HEAD: Normal noncontrast MRI head.  MRA HEAD: Normal noncontrast MRA head.   Electronically Signed   By: Awilda Metro M.D.   On: 04/24/2015 01:33   Mr Maxine Glenn Head/brain Wo Cm  04/24/2015   CLINICAL DATA:  Acute onset LEFT arm and face numbness at 7:30 p.m. yesterday. Assess stroke.  EXAM: MRI HEAD WITHOUT CONTRAST  MRA HEAD WITHOUT CONTRAST  TECHNIQUE: Multiplanar, multiecho pulse sequences of the brain and surrounding structures were obtained without intravenous contrast. Angiographic images of the head were obtained using MRA technique without contrast.  COMPARISON:  CT head April 23, 2015  FINDINGS: MRI  HEAD FINDINGS  The ventricles and sulci are normal for patient's age. No abnormal parenchymal signal, mass lesions, mass effect. No reduced diffusion to suggest acute ischemia. No susceptibility artifact to suggest hemorrhage.  No abnormal extra-axial fluid collections. No extra-axial masses though, contrast enhanced sequences would be more sensitive. Normal major intracranial vascular flow voids seen at the skull base.  Status post LEFT ocular globe scleral banding, ocular globes and orbital contents otherwise unremarkable. No abnormal sellar expansion. Trace paranasal sinus mucosal thickening without air-fluid levels. The mastoid air cells are well aerated. No suspicious calvarial bone marrow signal. No abnormal sellar expansion. Craniocervical junction maintained.  MRA HEAD FINDINGS  Anterior circulation: Normal flow related enhancement of the included cervical, petrous, cavernous and supra clinoid internal carotid arteries. Patent anterior communicating artery. Normal flow related enhancement of the anterior and middle cerebral arteries, including more distal segments.  No large vessel occlusion, high-grade stenosis, abnormal luminal irregularity, aneurysm.  Posterior circulation: LEFT vertebral artery is dominant. RIGHT vertebral artery terminates in the posterior inferior cerebellar artery. Small bilateral posterior communicating arteries are present. Basilar artery is patent, with normal flow related enhancement of the main branch vessels. Normal flow related enhancement of the posterior cerebral arteries.  No large vessel occlusion, high-grade stenosis, abnormal luminal irregularity, aneurysm.  IMPRESSION: MRI HEAD: Normal noncontrast MRI head.  MRA  HEAD: Normal noncontrast MRA head.   Electronically Signed   By: Awilda Metro M.D.   On: 04/24/2015 01:33    Scheduled Meds: . aspirin  300 mg Rectal Daily   Or  . aspirin  325 mg Oral Daily  . heparin  5,000 Units Subcutaneous 3 times per day    Continuous Infusions: . sodium chloride 75 mL/hr at 04/24/15 0118    Time spent: 25 minutes  Cherlyn Syring L  [facility]  [email or web address], password [facility] 04/24/2015, 10:26 PM  LOS: 1 day

## 2015-04-24 NOTE — Progress Notes (Signed)
STROKE TEAM PROGRESS NOTE   HISTORY Karina Neal is a 44 y.o. female with no known past medical history who presents with left arm and face numbness was acute onset 7:30 PM 04/23/2015 (LKW). She states that afterwards, she took a hot bath hoping it would go away, but it was still present and therefore came into the emergency room. Code stroke was activated and TPA was not administered due to mild symptoms.  She felt like she had mild weakness, but has not dropped any objects or difficulty with using her hand. She denies any vision change. She denies headache. She denies any other symptoms. She has never had anything like this before.  She was admitted for further evaluation and treatment.   SUBJECTIVE (INTERVAL HISTORY) Her husband is at the bedside.  Overall she feels her condition is gradually improving. Reports numbness is improving shared HA history. 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   OBJECTIVE Temp:  [98.1 F (36.7 C)-99.1 F (37.3 C)] 98.4 F (36.9 C) (08/15 0600) Pulse Rate:  [64-84] 64 (08/15 0600) Cardiac Rhythm:  [-] Normal sinus rhythm (08/14 2345) Resp:  [13-19] 18 (08/15 0600) BP: (112-147)/(64-88) 127/64 mmHg (08/15 0600) SpO2:  [97 %-100 %] 98 % (08/15 0600) Weight:  [89.177 kg (196 lb 9.6 oz)] 89.177 kg (196 lb 9.6 oz) (08/14 2359)  No results for input(s): GLUCAP in the last 168 hours.  Recent Labs Lab 04/23/15 2140 04/23/15 2142  NA 138 141  K 3.7 3.8  CL 104 101  CO2 27  --   GLUCOSE 105* 102*  BUN 9 11  CREATININE 0.84 0.90  CALCIUM 8.7*  --     Recent Labs Lab 04/23/15 2140  AST 19  ALT 13*  ALKPHOS 48  BILITOT 0.4  PROT 6.6  ALBUMIN 3.6    Recent Labs Lab 04/23/15 2140 04/23/15 2142  WBC 7.5  --   NEUTROABS 4.2  --   HGB 12.5 12.9  HCT 36.1 38.0  MCV 88.0  --   PLT 136*  --    No results for input(s): CKTOTAL, CKMB, CKMBINDEX, TROPONINI in the last 168 hours.  Recent  Labs  04/23/15 2140  LABPROT 13.8  INR 1.04    Recent Labs  04/23/15 2249  COLORURINE YELLOW  LABSPEC 1.021  PHURINE 7.0  GLUCOSEU NEGATIVE  HGBUR NEGATIVE  BILIRUBINUR NEGATIVE  KETONESUR NEGATIVE  PROTEINUR NEGATIVE  UROBILINOGEN 1.0  NITRITE NEGATIVE  LEUKOCYTESUR NEGATIVE       Component Value Date/Time   CHOL 155 04/24/2015 0228   CHOL 138 03/03/2013 0640   TRIG 76 04/24/2015 0228   TRIG 81 03/03/2013 0640   HDL 47 04/24/2015 0228   HDL 42 03/03/2013 0640   CHOLHDL 3.3 04/24/2015 0228   VLDL 15 04/24/2015 0228   VLDL 16 03/03/2013 0640   LDLCALC 93 04/24/2015 0228   LDLCALC 80 03/03/2013 0640   No results found for: HGBA1C    Component Value Date/Time   LABOPIA NONE DETECTED 04/23/2015 2249   COCAINSCRNUR NONE DETECTED 04/23/2015 2249   LABBENZ NONE DETECTED 04/23/2015 2249   AMPHETMU NONE DETECTED 04/23/2015 2249   THCU NONE DETECTED 04/23/2015 2249   LABBARB NONE DETECTED 04/23/2015 2249     Recent Labs Lab 04/23/15 2140  ETH <5     IMAGING  Ct Head Wo Contrast 04/23/2015   Unremarkable noncontrast CT of the head.    MRI HEAD  04/24/2015   Normal noncontrast MRI head.    MRA HEAD 04/24/2015   Normal noncontrast MRA head.     2D echo pending  CUS - pending   Physical exam  Temp:  [97.5 F (36.4 C)-99.1 F (37.3 C)] 97.5 F (36.4 C) (08/15 2134) Pulse Rate:  [58-87] 71 (08/15 2134) Resp:  [16-19] 18 (08/15 2134) BP: (100-136)/(56-88) 100/56 mmHg (08/15 2134) SpO2:  [95 %-99 %] 99 % (08/15 2134) Weight:  [196 lb 9.6 oz (89.177 kg)] 196 lb 9.6 oz (89.177 kg) (08/14 2359)  General - Well nourished, well developed, in no apparent distress.  Ophthalmologic - Sharp disc margins OU.   Cardiovascular - Regular rate and rhythm with no murmur.  Mental Status -  Level of arousal and orientation to time, place, and person were intact. Language including expression, naming, repetition, comprehension was assessed and found intact. Fund of  Knowledge was assessed and was intact.  Cranial Nerves II - XII - II - Visual field intact OU. III, IV, VI - Extraocular movements intact. V - Facial sensation intact bilaterally. VII - Facial movement intact bilaterally. VIII - Hearing & vestibular intact bilaterally. X - Palate elevates symmetrically. XI - Chin turning & shoulder shrug intact bilaterally. XII - Tongue protrusion intact.  Motor Strength - The patient's strength was normal in all extremities and pronator drift was absent.  Bulk was normal and fasciculations were absent.   Motor Tone - Muscle tone was assessed at the neck and appendages and was normal.  Reflexes - The patient's reflexes were 1+ in all extremities and she had no pathological reflexes.  Sensory - Light touch, temperature/pinprick, vibration and proprioception, and Romberg testing were assessed and were symmetrical.    Coordination - The patient had normal movements in the hands and feet with no ataxia or dysmetria.  Tremor was absent.  Gait and Station - not tested due to fatigue.   ASSESSMENT/PLAN [name] is a 44 y.o. female with no know past medical history presenting with left sided numbness. She did not receive IV t-PA due to mild symptoms.   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 Recently, more frequent with once a week for two weeks.  Resultant  L sided numbness resolved  For the past 2 weeks, L frontal headache  She reports months HA same area that moves to R  Reports sensitive to light, flashing lights at time, denies wavy lines   Husband reports they know she needs hew glassess  MRI  Unremarkable   MRA  Unremarkable   Carotid Doppler  pending   2D Echo  pending   LDL 93  HgbA1c pending  hypercoag and autoimmune work up pending  Heparin 5000 units sq tid for VTE prophylaxis Diet regular Room service appropriate?: Yes; Fluid consistency:: Thin  no  antithrombotic prior to admission, now on aspirin 325 mg orally every day. Given no stroke, no TIA, as long as 2D and carotids ok, recommend d/c aspirin  Ongoing aggressive stroke risk factor management  Therapy recommendations:  No therapy needs  Recommend stress relief measures  Disposition:  Return home  Recommend completion and stroke workup, then ok for discharge from neuro standpoint  No neuro followup indicated unless HA management assistance needed  Other Stroke Risk Factors  Obesity, Body mass index is 34.83 kg/(m^2).   LDL 93. As no stroke dx, no indication for statin  Hospital day # 1  Neurology  will sign off. Please call with questions. No neuro follow up needed. Thanks for the consult.  Marvel Plan, MD PhD Stroke Neurology 04/24/2015 11:08 PM   To contact Stroke Continuity provider, please refer to [email or web address]. After hours, contact General Neurology

## 2015-04-24 NOTE — Progress Notes (Signed)
OT Cancellation Note  Patient Details Name: Alesha Jaffee MRN: [id number] DOB: [birth year]   Cancelled Treatment:    Reason Eval/Treat Not Completed: Medical issues which prohibited therapy (BR - RN  notified therapy holding) OT to evaluate as appropriate and awaiting increased activity orders  Felecia Shelling   OTR/L Pager: [phone] Office: [phone] .  04/24/2015, 7:36 AM

## 2015-04-24 NOTE — Progress Notes (Signed)
PHYSICAL THERAPY EVALUATION  Clinical impression: Pt functioning near baseline with mild L LE weakness and balance impairment. Pt was a Production designer, theatre/television/film at [employer] and would benefit from outpatient PT for work hardening and higher level balance training to achieve independent function for safe return to work once medically cleared.   04/24/15 1100  PT Visit Information  Last PT Received On 04/24/15  Assistance Needed +1  History of Present Illness 44 yo female admitted with L side numbness. MRI and CT negative with workup. PMH: eye surg, appendectomy cesarean section  Precautions  Precautions Fall  Restrictions  Weight Bearing Restrictions No  Home Living  Family/patient expects to be discharged to: Private residence  Living Arrangements Children  Available Help at Discharge Available 24 hours/day;Family  Type of Home House  Home Access Level entry  Home Layout Two level;1/2 bath on main level;Bed/bath upstairs  Alternate Level Stairs-Number of Steps 16  Alternate Level Stairs-Rails Right  Bathroom Shower/Tub Walk-in Corporate treasurer None  Additional Comments Driving, Art therapist   Prior Function  Level of Independence Independent  Communication  Communication No difficulties  Pain Assessment  Pain Assessment No/denies pain  Cognition  Arousal/Alertness Awake/alert  Behavior During Therapy WFL for tasks assessed/performed  Overall Cognitive Status Within Functional Limits for tasks assessed  Upper Extremity Assessment  Upper Extremity Assessment Defer to OT evaluation  Lower Extremity Assessment  Lower Extremity Assessment LLE deficits/detail  Cervical / Trunk Assessment  Cervical / Trunk Assessment Normal  Transfers  Overall transfer level Modified independent  Equipment used None  General transfer comment pt sit to stand with no good speed and no physical assist from therapist  Ambulation/Gait  Ambulation/Gait assistance Supervision   Ambulation Distance (Feet) 510 Feet  Assistive device None  Gait Pattern/deviations Step-through pattern;Narrow base of support;Decreased stride length  General Gait Details pt states that her walking is back to baseline, pt demo mild L and R drift when amublating however able to self correct  Gait velocity decreased   Gait velocity interpretation Below normal speed for age/gender  Stairs Yes  Stairs assistance Supervision  Stair Management One rail Right;Forwards  Number of Stairs 12 (x2)  General stair comments pt demo safe negotiation of stairs with no LOB, cues giving to hold on to railing   Modified Rankin (Stroke Patients Only)  Pre-Morbid Rankin Score 0  Modified Rankin 1  Balance  Overall balance assessment Needs assistance  Sitting-balance support No upper extremity supported;Feet supported  Sitting balance-Leahy Scale Normal  Standing balance support No upper extremity supported;During functional activity  Standing balance-Leahy Scale Good  Standing balance comment due to LLE weakness pt feels mildly "wobbly" with amulation  Standardized Balance Assessment  Standardized Balance Assessment  Dynamic Gait Index  Dynamic Gait Index  Level Surface 3  Change in Gait Speed 3  Gait with Horizontal Head Turns 3  Gait with Vertical Head Turns 3  Gait and Pivot Turn 2  Step Over Obstacle 2  Step Around Obstacles 3  Steps 3  Total Score 22  General Comments  General comments (skin integrity, edema, etc.) pt states that she feels back to baseline for mobility   PT - End of Session  Equipment Utilized During Treatment Gait belt  Activity Tolerance Patient tolerated treatment well  Patient left in chair;with call bell/phone within reach;with chair alarm set  Nurse Communication Mobility status  PT Assessment  PT Therapy Diagnosis  Difficulty walking;Generalized weakness  PT Recommendation/Assessment Patient needs  continued PT services  PT Problem List Decreased  strength;Decreased activity tolerance;Decreased balance;Decreased mobility  PT Plan  PT Frequency (ACUTE ONLY) Min 3X/week  PT Treatment/Interventions (ACUTE ONLY) Stair training;Functional mobility training;Gait training;Therapeutic activities;Balance training;Neuromuscular re-education;Therapeutic exercise  PT Recommendation  Follow Up Recommendations Outpatient PT;Supervision - Intermittent  PT equipment None recommended by PT  Individuals Consulted  Consulted and Agree with Results and Recommendations Patient  Acute Rehab PT Goals  Patient Stated Goal to return home  PT Goal Formulation With patient  Time For Goal Achievement 05/01/15  Potential to Achieve Goals Good  PT Time Calculation  PT Start Time (ACUTE ONLY) 1123  PT Stop Time (ACUTE ONLY) 1148  PT Time Calculation (min) (ACUTE ONLY) 25 min  PT General Charges  $$ ACUTE PT VISIT 1 Procedure  PT Evaluation  $Initial PT Evaluation Tier I 1 Procedure  PT Treatments  $Gait Training 8-22 mins  Written Expression  Dominant Hand Right   Lewis Shock, PT, DPT Pager #: [phone] Office #: [phone]

## 2015-04-24 NOTE — Evaluation (Addendum)
Occupational Therapy Evaluation Patient Details Name: Karina Neal MRN: [id number] DOB: [birth year] Today's Date: 04/24/2015    History of Present Illness 44 yo female admitted with L side numbness. MRI and CT negative with workup. PMH: eye surg, appendectomy cesarean section   Clinical Impression   PT admitted with L side numbness. Pt currently with functional limitiations due to the deficits listed below (see OT problem list). PTA independent with all adls. Pt d/c discharge home with outpatient for balance.Pt noted to have light sensitivity to bright light. Pt sitting in dark room on arrival due to light sensitivity.      Follow Up Recommendations  Outpatient OT (balance)    Equipment Recommendations  None recommended by OT (borrow from mother)    Recommendations for Other Services       Precautions / Restrictions Precautions Precautions: Fall Precaution Comments: x1 fall in last few days ( posterior LOB stepping down out of car)      Mobility Bed Mobility Overal bed mobility: Independent                Transfers Overall transfer level: Modified independent                    Balance Overall balance assessment: Needs assistance                               Standardized Balance Assessment Standardized Balance Assessment : Berg Balance Test;Dynamic Gait Index Berg Balance Test Sit to Stand: Able to stand without using hands and stabilize independently Standing Unsupported: Able to stand safely 2 minutes Sitting with Back Unsupported but Feet Supported on Floor or Stool: Able to sit safely and securely 2 minutes Stand to Sit: Sits safely with minimal use of hands Transfers: Able to transfer safely, minor use of hands Standing Unsupported with Eyes Closed: Able to stand 10 seconds safely Standing Ubsupported with Feet Together: Able to place feet together independently and stand 1 minute safely From Standing, Reach Forward with  Outstretched Arm: Can reach confidently >25 cm (10") From Standing Position, Pick up Object from Floor: Able to pick up shoe, needs supervision Standing Unsupported, One Foot in Front: Able to plae foot ahead of the other independently and hold 30 seconds Standing on One Leg: Tries to lift leg/unable to hold 3 seconds but remains standing independently Dynamic Gait Index Level Surface: Mild Impairment Change in Gait Speed: Mild Impairment      ADL Overall ADL's : Modified independent                                       General ADL Comments: Pt educated on fall risk with adls based on balance assessment. Pt advised to sit for all dressing , bathing and (A) for shower transfer. Pt with LOB immediately with any single leg standing.     Vision Vision Assessment?: No apparent visual deficits   Perception     Praxis      Pertinent Vitals/Pain       Hand Dominance Right   Extremity/Trunk Assessment Upper Extremity Assessment Upper Extremity Assessment: Overall WFL for tasks assessed   Lower Extremity Assessment Lower Extremity Assessment: LLE deficits/detail LLE Deficits / Details: reports pain at hip area, numbness   Cervical / Trunk Assessment Cervical / Trunk Assessment: Normal   Communication Communication Communication: No  difficulties   Cognition Arousal/Alertness: Awake/alert Behavior During Therapy: WFL for tasks assessed/performed Overall Cognitive Status: Within Functional Limits for tasks assessed                     General Comments       Exercises       Shoulder Instructions      Home Living Family/patient expects to be discharged to:: Private residence Living Arrangements: Children Available Help at Discharge: Available 24 hours/day;Family Type of Home: House Home Access: Level entry     Home Layout: Two level;1/2 bath on main level;Bed/bath upstairs     Bathroom Shower/Tub: Producer, television/film/video:  Standard     Home Equipment: None   Additional Comments: Driving, Art therapist       Prior Functioning/Environment Level of Independence: Independent             OT Diagnosis: Generalized weakness   OT Problem List:     OT Treatment/Interventions:      OT Goals(Current goals can be found in the care plan section) Acute Rehab OT Goals Patient Stated Goal: to return home OT Goal Formulation: With patient/family Potential to Achieve Goals: Good  OT Frequency:     Barriers to D/C:            Co-evaluation              End of Session Equipment Utilized During Treatment: Gait belt Nurse Communication: Mobility status;Precautions  Activity Tolerance: Patient tolerated treatment well Patient left: in bed;with call bell/phone within reach;with family/visitor present   Time: 1030-1049 OT Time Calculation (min): 19 min Charges:  OT General Charges $OT Visit: 1 Procedure OT Evaluation $Initial OT Evaluation Tier I: 1 Procedure G-Codes: OT G-codes **NOT FOR INPATIENT CLASS** Functional Assessment Tool Used: clinical judgement Functional Limitation: Self care Self Care Current Status (Z6109): At least 1 percent but less than 20 percent impaired, limited or restricted Self Care Goal Status (U0454): At least 1 percent but less than 20 percent impaired, limited or restricted Self Care Discharge Status [phone]): At least 1 percent but less than 20 percent impaired, limited or restricted  [name] 04/24/2015, 11:10 AM  [name]   OTR/L Pager: [phone] Office: [phone] .

## 2015-04-25 ENCOUNTER — Other Ambulatory Visit (HOSPITAL_COMMUNITY): Payer: Self-pay

## 2015-04-25 ENCOUNTER — Inpatient Hospital Stay (HOSPITAL_COMMUNITY): Payer: MEDICAID

## 2015-04-25 ENCOUNTER — Observation Stay (HOSPITAL_BASED_OUTPATIENT_CLINIC_OR_DEPARTMENT_OTHER): Payer: Self-pay

## 2015-04-25 ENCOUNTER — Inpatient Hospital Stay (HOSPITAL_BASED_OUTPATIENT_CLINIC_OR_DEPARTMENT_OTHER): Payer: Self-pay

## 2015-04-25 DIAGNOSIS — R200 Anesthesia of skin: Secondary | ICD-10-CM

## 2015-04-25 DIAGNOSIS — G43909 Migraine, unspecified, not intractable, without status migrainosus: Secondary | ICD-10-CM

## 2015-04-25 DIAGNOSIS — I6789 Other cerebrovascular disease: Secondary | ICD-10-CM

## 2015-04-25 LAB — BETA-2-GLYCOPROTEIN I ABS, IGG/M/A
Beta-2 Glyco I IgG: <9 GPI IgG units (ref 0–20)
Beta-2-Glycoprotein I IgA: <9 GPI IgA units (ref 0–25)
Beta-2-Glycoprotein I IgM: <9 GPI IgM units (ref 0–32)

## 2015-04-25 LAB — CARDIOLIPIN ANTIBODIES, IGG, IGM, IGA
Anticardiolipin IgA: <9 APL U/mL (ref 0–11)
Anticardiolipin IgG: <9 GPL U/mL (ref 0–14)
Anticardiolipin IgM: <9 MPL U/mL (ref 0–12)

## 2015-04-25 LAB — ANTINUCLEAR ANTIBODIES, IFA: ANA Ab, IFA: NEGATIVE

## 2015-04-25 LAB — HOMOCYSTEINE: Homocysteine: 15 umol/L (ref 0.0–15.0)

## 2015-04-25 LAB — PROTEIN C, TOTAL: Protein C, Total: 108 % (ref 70–140)

## 2015-04-25 NOTE — CV Procedure (Signed)
On 04/24/15 Nurse asked the Cardiovascular department to perform both the Echocardiogram and Carotid Ultrasound first thing in the morning. On 04/25/2015 at 7:30 AM unable to due exams due to the relocation of  [name]. Please advise when the patient is ready to be evaluated.   Karina Neal [facility] 04/25/2015 7:52 AM

## 2015-04-25 NOTE — Progress Notes (Signed)
SLP Cancellation Note  Patient Details Name: Sheron Tallman MRN: [id number] DOB: 1970/10/16   Cancelled treatment:       Reason Eval/Treat Not Completed: SLP screened, no needs identified, will sign off   Blenda Mounts [place]ice 04/25/2015, 2:42 PM

## 2015-04-25 NOTE — Progress Notes (Signed)
  Echocardiogram 2D Echocardiogram has been performed.  Cathie Beams 04/25/2015, 1:29 PM

## 2015-04-25 NOTE — Progress Notes (Signed)
VASCULAR LAB PRELIMINARY  PRELIMINARY  PRELIMINARY  PRELIMINARY  Carotid duplex completed.    Preliminary report:  Bilateral:  1-39% ICA stenosis.  Vertebral artery flow is antegrade.     Dwanna Goshert, RVS 04/25/2015, 4:27 PM

## 2015-04-25 NOTE — Care Management Note (Signed)
Case Management Note  Patient Details  Name: Karina Neal MRN: [id number] Date of Birth: 06-Aug-1971  Subjective/Objective:                    Action/Plan: Patient is listed as self-pay, is being followed by [facility] in Financial Counseling.  CM met with patient, who states that she does NOT have a PCP.  Patient was provided with a listed of local clinics accepting uninsured patients. Patient was encouraged to contact her clinic of choice as soon as possible to establish care.  Patient verbalized understanding.  Patient denies any medication needs for discharge.  Expected Discharge Date:   (pending)               Expected Discharge Plan:  Home/Self Care  In-House Referral:  Financial Counselor  Discharge [facility]  Post Acute Care Choice:    Choice offered to:     DME Arranged:    DME Agency:     HH Arranged:    HH Agency:     Status of Service:  In process, will continue to follow  Medicare Important Message Given:    Date Medicare IM Given:    Medicare IM give by:    Date Additional Medicare IM Given:    Additional Medicare Important Message give by:     If discussed at [place] of Stay Meetings, dates discussed:    Additional Comments:  Rolm Baptise, RN 04/25/2015, 11:40 AM [phone]

## 2015-04-25 NOTE — Progress Notes (Signed)
Pt is being discharge home. Discharge instructions were given to patient and family 

## 2015-04-25 NOTE — Progress Notes (Signed)
Physical Therapy Treatment Patient Details Name: Karina Neal MRN: [id number] DOB: 09/19/1970 Today's Date: 04/25/2015    History of Present Illness 44 yo female admitted with L side numbness. MRI and CT negative with workup. PMH: eye surg, appendectomy cesarean section    PT Comments    Pt is progressing towards physical therapy goals. Pt participated in stair training again today and showed an improved speed and confidence with negotiation. Pt fiance was present during the therapy session and was very motivating. Pt will benefit from DC destination to improve LLE strength and over all functional mobility to improve pt independence.   Follow Up Recommendations  Outpatient PT;Supervision - Intermittent     Equipment Recommendations  None recommended by PT    Recommendations for Other Services       Precautions / Restrictions Precautions Precautions: Fall Precaution Comments: x1 fall in last few days ( posterior LOB stepping down out of car) Restrictions Weight Bearing Restrictions: No    Mobility  Bed Mobility Overal bed mobility: Independent                Transfers Overall transfer level: Modified independent Equipment used: None             General transfer comment: pt able to stand without physical assist from therapist  Ambulation/Gait Ambulation/Gait assistance: Supervision Ambulation Distance (Feet): 300 Feet Assistive device: None Gait Pattern/deviations: Step-to pattern;Narrow base of support;Drifts right/left Gait velocity: decreased  Gait velocity interpretation: Below normal speed for age/gender General Gait Details: pt state that she feel better with her ambulation today from yesterday, there was no LOB noted, due to LLE weakness there were mild L and R drifts that pt was able to self correct.   Stairs Stairs: Yes Stairs assistance: Supervision Stair Management: One rail Right;Forwards Number of Stairs: 8 General stair comments:  pt able to recall how to sequence the stairs from the last therapy session. pt with no LOB or unsteadiness,   Wheelchair Mobility    Modified Rankin (Stroke Patients Only) Modified Rankin (Stroke Patients Only) Pre-Morbid Rankin Score: No symptoms Modified Rankin: No significant disability     Balance Overall balance assessment: Independent Sitting-balance support: No upper extremity supported;Feet supported Sitting balance-Leahy Scale: Normal     Standing balance support: No upper extremity supported;During functional activity Standing balance-Leahy Scale: Good Standing balance comment: pt with improved confidence in her balance, able to stand in handle challeges on either LE  without the use of an AD                    Cognition Arousal/Alertness: Awake/alert Behavior During Therapy: WFL for tasks assessed/performed Overall Cognitive Status: Within Functional Limits for tasks assessed                      Exercises General Exercises - Lower Extremity Hip ABduction/ADduction: AROM;Strengthening;10 reps;Both;Sidelying;Seated    General Comments General comments (skin integrity, edema, etc.): pt fiance join [place] during therapy session      Pertinent Vitals/Pain Pain Assessment: No/denies pain    Home Living                      Prior Function            PT Goals (current goals can now be found in the care plan section) Acute Rehab PT Goals Patient Stated Goal: to return home Progress towards PT goals: Progressing toward goals    Frequency  Min  3X/week    PT Plan Current plan remains appropriate    Co-evaluation             End of Session Equipment Utilized During Treatment: Gait belt Activity Tolerance: Patient tolerated treatment well Patient left: in bed;with call bell/phone within reach;with family/visitor present     Time: 1610-9604 PT Time Calculation (min) (ACUTE ONLY): 13 min  Charges:  $Gait Training: 8-22 mins                     G Codes:      Carren Rang 07-May-2015, 4:36 PM   Dorrene German (student physical therapy assistant) Acute Rehab [phone]

## 2015-04-25 NOTE — Discharge Summary (Addendum)
Physician Discharge Summary  Don Giarrusso UJW:[id number] DOB: July 24, 1971 DOA: 04/23/2015  PCP: No primary care provider on file.  Admit date: 04/23/2015 Discharge date: 04/25/2015  Time spent: 35 minutes  Recommendations for Outpatient Follow-up:  Establish with PCP Patient asking for extensive time off work -2-3 weeks-- note given for return on 8/22  Discharge Diagnoses:  Principal Problem:   Left sided numbness Active Problems:   Stroke   Complicated migraine   Discharge Condition: improved  Diet recommendation: regular  Filed Weights   04/23/15 2359  Weight: 89.177 kg (196 lb 9.6 oz)    History of present illness:  Karina Neal is a 44 y.o. female Without significant past medical history, who presents with left arm and face numbness.  Patient reports that she has been generally healthy until 7:30 PM when she started having left arm and face numbness. She also has very minimal weakness in left arm, no vision change, hearing loss. Patient does not have chest pain, shortness breath, cough, abdominal pain, diarrhea, UTI symptoms, rashes.   In ED, patient was found to have WBC 7.5, INR 1.04, PTT 30, troponin negative, no tachycardia, electrolytes okay. CT head is negative for acute abnormalities. Patient is admitted to inpatient for further evaluation and treatment. Neurology was consulted.  Hospital Course:  Left sided numbness: MRI negative. Await echo and carotid dopplers. D/w Dr. Roda Shutters. Could be complicated migraine Echo/carotid ok  Procedures:    Consultations:  neuro  Discharge Exam: Filed Vitals:   04/25/15 1524  BP: 115/73  Pulse: 75  Temp: 99 F (37.2 C)  Resp: 20    General: A+Ox3, NAD   Discharge Instructions   Discharge Instructions    Diet general    Complete by:  As directed      Increase activity slowly    Complete by:  As directed           Current Discharge Medication List    CONTINUE these medications which have NOT  CHANGED   Details  acetaminophen (TYLENOL) 500 MG tablet Take 1 tablet (500 mg total) by mouth every 6 (six) hours as needed for pain. Qty: 30 tablet, Refills: 0      STOP taking these medications     HYDROcodone-acetaminophen (NORCO/VICODIN) 5-325 MG per tablet      meloxicam (MOBIC) 7.5 MG tablet      methocarbamol (ROBAXIN) 500 MG tablet        Allergies  Allergen Reactions  . Shellfish Allergy Anaphylaxis    Throat swelling, dyspnea  . Penicillins     Hives    Follow-up Information    Please follow up.   Why:  PCP as needed       The results of significant diagnostics from this hospitalization (including imaging, microbiology, ancillary and laboratory) are listed below for reference.    Significant Diagnostic Studies: Ct Head Wo Contrast  04/23/2015   CLINICAL DATA:  Code stroke. Right arm numbness, acute onset. Initial encounter.  EXAM: CT HEAD WITHOUT CONTRAST  TECHNIQUE: Contiguous axial images were obtained from the base of the skull through the vertex without intravenous contrast.  COMPARISON:  CT of the head performed 04/06/2014  FINDINGS: There is no evidence of acute infarction, mass lesion, or intra- or extra-axial hemorrhage on CT.  The posterior fossa, including the cerebellum, brainstem and fourth ventricle, is within normal limits. The third and lateral ventricles, and basal ganglia are unremarkable in appearance. The cerebral hemispheres are symmetric in appearance, with normal gray-white  differentiation. No mass effect or midline shift is seen.  There is no evidence of fracture; visualized osseous structures are unremarkable in appearance. The patient is status post left-sided scleral banding. The right orbit is within normal limits. The paranasal sinuses and mastoid air cells are well-aerated. No significant soft tissue abnormalities are seen.  IMPRESSION: Unremarkable noncontrast CT of the head.  These results were called by telephone at the time of  interpretation on 04/23/2015 at 10:01 pm to Dr. Amada Jupiter, who verbally acknowledged these results.   Electronically Signed   By: Roanna Raider M.D.   On: 04/23/2015 22:01   Mr Brain Wo Contrast  04/24/2015   CLINICAL DATA:  Acute onset LEFT arm and face numbness at 7:30 p.m. yesterday. Assess stroke.  EXAM: MRI HEAD WITHOUT CONTRAST  MRA HEAD WITHOUT CONTRAST  TECHNIQUE: Multiplanar, multiecho pulse sequences of the brain and surrounding structures were obtained without intravenous contrast. Angiographic images of the head were obtained using MRA technique without contrast.  COMPARISON:  CT head April 23, 2015  FINDINGS: MRI HEAD FINDINGS  The ventricles and sulci are normal for patient's age. No abnormal parenchymal signal, mass lesions, mass effect. No reduced diffusion to suggest acute ischemia. No susceptibility artifact to suggest hemorrhage.  No abnormal extra-axial fluid collections. No extra-axial masses though, contrast enhanced sequences would be more sensitive. Normal major intracranial vascular flow voids seen at the skull base.  Status post LEFT ocular globe scleral banding, ocular globes and orbital contents otherwise unremarkable. No abnormal sellar expansion. Trace paranasal sinus mucosal thickening without air-fluid levels. The mastoid air cells are well aerated. No suspicious calvarial bone marrow signal. No abnormal sellar expansion. Craniocervical junction maintained.  MRA HEAD FINDINGS  Anterior circulation: Normal flow related enhancement of the included cervical, petrous, cavernous and supra clinoid internal carotid arteries. Patent anterior communicating artery. Normal flow related enhancement of the anterior and middle cerebral arteries, including more distal segments.  No large vessel occlusion, high-grade stenosis, abnormal luminal irregularity, aneurysm.  Posterior circulation: LEFT vertebral artery is dominant. RIGHT vertebral artery terminates in the posterior inferior cerebellar  artery. Small bilateral posterior communicating arteries are present. Basilar artery is patent, with normal flow related enhancement of the main branch vessels. Normal flow related enhancement of the posterior cerebral arteries.  No large vessel occlusion, high-grade stenosis, abnormal luminal irregularity, aneurysm.  IMPRESSION: MRI HEAD: Normal noncontrast MRI head.  MRA HEAD: Normal noncontrast MRA head.   Electronically Signed   By: Awilda Metro M.D.   On: 04/24/2015 01:33   Mr Maxine Glenn Head/brain Wo Cm  04/24/2015   CLINICAL DATA:  Acute onset LEFT arm and face numbness at 7:30 p.m. yesterday. Assess stroke.  EXAM: MRI HEAD WITHOUT CONTRAST  MRA HEAD WITHOUT CONTRAST  TECHNIQUE: Multiplanar, multiecho pulse sequences of the brain and surrounding structures were obtained without intravenous contrast. Angiographic images of the head were obtained using MRA technique without contrast.  COMPARISON:  CT head April 23, 2015  FINDINGS: MRI HEAD FINDINGS  The ventricles and sulci are normal for patient's age. No abnormal parenchymal signal, mass lesions, mass effect. No reduced diffusion to suggest acute ischemia. No susceptibility artifact to suggest hemorrhage.  No abnormal extra-axial fluid collections. No extra-axial masses though, contrast enhanced sequences would be more sensitive. Normal major intracranial vascular flow voids seen at the skull base.  Status post LEFT ocular globe scleral banding, ocular globes and orbital contents otherwise unremarkable. No abnormal sellar expansion. Trace paranasal sinus mucosal thickening without air-fluid levels. The  mastoid air cells are well aerated. No suspicious calvarial bone marrow signal. No abnormal sellar expansion. Craniocervical junction maintained.  MRA HEAD FINDINGS  Anterior circulation: Normal flow related enhancement of the included cervical, petrous, cavernous and supra clinoid internal carotid arteries. Patent anterior communicating artery. Normal flow  related enhancement of the anterior and middle cerebral arteries, including more distal segments.  No large vessel occlusion, high-grade stenosis, abnormal luminal irregularity, aneurysm.  Posterior circulation: LEFT vertebral artery is dominant. RIGHT vertebral artery terminates in the posterior inferior cerebellar artery. Small bilateral posterior communicating arteries are present. Basilar artery is patent, with normal flow related enhancement of the main branch vessels. Normal flow related enhancement of the posterior cerebral arteries.  No large vessel occlusion, high-grade stenosis, abnormal luminal irregularity, aneurysm.  IMPRESSION: MRI HEAD: Normal noncontrast MRI head.  MRA HEAD: Normal noncontrast MRA head.   Electronically Signed   By: Awilda Metro M.D.   On: 04/24/2015 01:33    Microbiology: No results found for this or any previous visit (from the past 240 hour(s)).   Labs: Basic Metabolic Panel:  Recent Labs Lab 04/23/15 2140 04/23/15 2142  NA 138 141  K 3.7 3.8  CL 104 101  CO2 27  --   GLUCOSE 105* 102*  BUN 9 11  CREATININE 0.84 0.90  CALCIUM 8.7*  --    Liver Function Tests:  Recent Labs Lab 04/23/15 2140  AST 19  ALT 13*  ALKPHOS 48  BILITOT 0.4  PROT 6.6  ALBUMIN 3.6   No results for input(s): LIPASE, AMYLASE in the last 168 hours. No results for input(s): AMMONIA in the last 168 hours. CBC:  Recent Labs Lab 04/23/15 2140 04/23/15 2142  WBC 7.5  --   NEUTROABS 4.2  --   HGB 12.5 12.9  HCT 36.1 38.0  MCV 88.0  --   PLT 136*  --    Cardiac Enzymes: No results for input(s): CKTOTAL, CKMB, CKMBINDEX, TROPONINI in the last 168 hours. BNP: BNP (last 3 results) No results for input(s): BNP in the last 8760 hours.  ProBNP (last 3 results) No results for input(s): PROBNP in the last 8760 hours.  CBG: No results for input(s): GLUCAP in the last 168 hours.     SignedMarlin Canary  [facility] 04/25/2015, 4:35 PM

## 2015-04-26 NOTE — Progress Notes (Signed)
CM received an after-hours consult to arrange outpatient PT. Patient was discharged home. CM spoke with patient via phone, to verify that she is interested in Outpatient PT.  Patient was provided with information on outpatient neuro rehab, and referral was sent through [facility].

## 2015-04-27 ENCOUNTER — Encounter: Payer: Self-pay | Admitting: Physical Therapy

## 2015-04-27 ENCOUNTER — Ambulatory Visit: Payer: MEDICAID | Attending: Internal Medicine | Admitting: Physical Therapy

## 2015-04-27 DIAGNOSIS — R29898 Other symptoms and signs involving the musculoskeletal system: Secondary | ICD-10-CM

## 2015-04-27 DIAGNOSIS — M545 Low back pain, unspecified: Secondary | ICD-10-CM

## 2015-04-27 DIAGNOSIS — R200 Anesthesia of skin: Secondary | ICD-10-CM

## 2015-04-27 DIAGNOSIS — R202 Paresthesia of skin: Secondary | ICD-10-CM | POA: Insufficient documentation

## 2015-04-27 DIAGNOSIS — R269 Unspecified abnormalities of gait and mobility: Secondary | ICD-10-CM

## 2015-04-27 LAB — PROTEIN C ACTIVITY: Protein C Activity: 128 % (ref 74–151)

## 2015-04-27 LAB — LUPUS ANTICOAGULANT PANEL
DRVVT: 37.7 s (ref 0.0–55.1)
PTT Lupus Anticoagulant: 38.7 s (ref 0.0–50.0)

## 2015-04-27 LAB — HEMOGLOBIN A1C
Hgb A1c MFr Bld: 5.4 % (ref 4.8–5.6)
Mean Plasma Glucose: 108 mg/dL

## 2015-04-27 LAB — PROTHROMBIN GENE MUTATION

## 2015-04-27 LAB — PROTEIN S ACTIVITY: Protein S Activity: 79 % (ref 60–145)

## 2015-04-27 LAB — FACTOR 5 LEIDEN

## 2015-04-27 LAB — PROTEIN S, TOTAL: Protein S Ag, Total: 92 % (ref 58–150)

## 2015-04-28 ENCOUNTER — Encounter: Payer: Self-pay | Admitting: Physical Therapy

## 2015-04-28 NOTE — Patient Instructions (Addendum)
HIP / KNEE: Flexion, Knee to Chest - Supine   Raise knee to chest. Keep leg in a straight line. Perform slowly. __10_ reps per set, __2_ sets per day, _5__ days per week  Copyright  VHI. All rights reserved.  (Home) Flexion: Pelvic Tilt   Lie with neck supported, knees bent, feet flat. Tighten and suck stomach in, pushing back down against surface. Do not push down with legs. Repeat _10___ times per set. Do _1___ sets per session. Do __5__ sessions per week.  Copyright  VHI. All rights reserved.

## 2015-04-28 NOTE — Therapy (Signed)
[facility] [address] [place], [place], [address] Phone: [phone]   Fax:  [phone]  Physical Therapy Evaluation  Patient Details  Name: Karina Neal MRN: [id number] Date of Birth: 1971-08-25 Referring Provider:  Joseph Art, DO  Encounter Date: 04/27/2015      PT End of Session - 04/28/15 1457    Visit Number 1   Number of Visits 9   Date for PT Re-Evaluation 05/28/15   Authorization Type self pay;  Medicaid potential   PT Start Time 0849   PT Stop Time 0929   PT Time Calculation (min) 40 min      History reviewed. No pertinent past medical history.  Past Surgical History  Procedure Laterality Date  . Cesarean section    . Eye surgery    . Appendectomy      There were no vitals filed for this visit.  Visit Diagnosis:  Weakness of left leg - Plan: PT plan of care cert/re-cert  Numbness and tingling of left arm and leg - Plan: PT plan of care cert/re-cert  Left-sided low back pain without sciatica - Plan: PT plan of care cert/re-cert  Abnormality of gait - Plan: PT plan of care cert/re-cert      Subjective Assessment - 04/28/15 0825    Subjective Pt. reports onset of L sided weakness on 04-23-15; went to ED and was admitted and discharged on 04-25-15;  pt. continues to report LUE and LLE weakness and numbness with c/o fatigue and inabiity to amb. long distances; pt states she is scheduled to return to work on 05-01-15 but does not feel she is capable of doing her job.  Pt. is accompanied to PT by her fiance' and they report that a stroke was ruled out, however, stroke is listed on problem list.  PT. reports having had a fall approx. 3 weeks ago and injured her L hip/low back area resulting in pain with LLE AROM  Patient is accompained by: --  fiance   Pertinent History L sided numbness; complicated migraine   Diagnostic tests MRI   Patient Stated Goals increase strength L side   Currently in Pain? --   Pain Score 8    Pain Location Hip   Pain Orientation Left   Pain Descriptors / Indicators Aching;Burning;Constant;Sharp;Shooting;Radiating   Pain Type Acute pain   Pain Radiating Towards outer L thigh   Pain Onset 1 to 4 weeks ago   Pain Frequency Constant   Aggravating Factors  alot of walking and standing   Pain Relieving Factors cold pack            [facility] PT Assessment - 04/28/15 0001    Assessment   Medical Diagnosis L sided numbness; complicated migraine   Onset Date/Surgical Date 04/23/15   Next MD Visit unknown - pt is currently trying to establish PCP   Precautions   Precautions Fall   Precaution Comments L hip and LBP contributing to decr. mobility   Balance Screen   Has the patient fallen in the past 6 months Yes   How many times? 1   Has the patient had a decrease in activity level because of a fear of falling?  No   Is the patient reluctant to leave their home because of a fear of falling?  No   Home Environment   Living Environment Private residence   Type of Home House   Home Layout Two level   Alternate Level Stairs-Number of Steps 12   Prior Function   Level of Independence Independent   Sensation   Additional Comments Pt. reports LUE and LLE numbness & tingling   AROM   Right/Left Shoulder Left   Left Shoulder Flexion 100 Degrees   Left Shoulder ABduction 96 Degrees   Strength   Overall Strength Comments L knee flexors 3+/5 with c/o pain with testing;  L plantarflexors 3+/5;  L grip strength 20#                                                  Left Hip Extension 3-/5  with c/o pain in low back   Left Hip ABduction 3/5  with c/o pain    Ambulation/Gait   Ambulation/Gait Yes   Ambulation/Gait Assistance 6: Modified independent (Device/Increase time)   Assistive device None   Gait Pattern Antalgic;Decreased step length - left;Decreased weight shift to left   Ambulation Surface Level;Indoor   Gait velocity 2.18  15.03 secs no device   Balance   Balance Assessed Yes   Static Standing Balance   Static Standing - Comment/# of Minutes single limb stance Rle > 10 SECS:  LLE 2.19, 7.44 secs; tandem stance 14.87 secs   Timed Up and Go Test   Normal TUG (seconds) 15.59                           PT Education - 04/28/15 1410    Education provided Yes   Education Details pt. given packet for low back stretches - pelvic tilt and knee to chest   Person(s) Educated Patient;Other (comment)   Methods Explanation;Handout   Comprehension Verbalized understanding;Need further instruction             PT Long Term Goals - 04/28/15 1505    PT  LONG TERM GOAL #1   Title Pt. will ambulate 1000' on all surfaces independently without LOB  (05-28-15)   Baseline Pt. amb. 100' prior to c/o fatigue and needing a seated rest break; gait deviations present due to LLE weakness and c/o low back/hip pain   Time 4   Period Weeks   Status New   PT LONG TERM GOAL #2   Title Report at least 50% improvement in L low back and hip region to incr. ease with mobility (05-28-15)   Baseline 8/10 pain intensity in low back/L hip region   Time 4   Period Weeks   Status New   PT LONG TERM GOAL #3   Title Independent in HEP for LLE strengthening and walking program.  (05-28-15)   Baseline Dependent   Time 4   Period Weeks   Status New   PT LONG TERM GOAL #4   Title Improve TUG score to </= 12 secs to decr. fall risk.  (05-28-15)   Baseline 15.59 secs   Time 4   Period Weeks   Status New               Plan - 04/28/15 1505    Consulted and Agree with Plan of Care Patient  fiance         Problem List Patient Active  Problem List   Diagnosis Date Noted  . Complicated migraine   . Left sided numbness 04/23/2015  . Stroke 04/23/2015    Kary Kos, PT 04/28/2015, 3:19 PM  [facility] [address] [place], [place], [address] Phone: [phone]   Fax:  [phone]

## 2016-02-19 ENCOUNTER — Emergency Department
Admission: EM | Admit: 2016-02-19 | Discharge: 2016-02-19 | Disposition: A | Discharge status: Home / Self Care | Payer: Self-pay | Attending: Emergency Medicine | Admitting: Emergency Medicine

## 2016-02-19 ENCOUNTER — Emergency Department: Payer: Self-pay

## 2016-02-19 ENCOUNTER — Encounter: Payer: Self-pay | Admitting: *Deleted

## 2016-02-19 DIAGNOSIS — S59901A Unspecified injury of right elbow, initial encounter: Secondary | ICD-10-CM | POA: Insufficient documentation

## 2016-02-19 DIAGNOSIS — Y999 Unspecified external cause status: Secondary | ICD-10-CM | POA: Insufficient documentation

## 2016-02-19 DIAGNOSIS — Z8673 Personal history of transient ischemic attack (TIA), and cerebral infarction without residual deficits: Secondary | ICD-10-CM | POA: Insufficient documentation

## 2016-02-19 DIAGNOSIS — W109XXA Fall (on) (from) unspecified stairs and steps, initial encounter: Secondary | ICD-10-CM | POA: Insufficient documentation

## 2016-02-19 DIAGNOSIS — Y929 Unspecified place or not applicable: Secondary | ICD-10-CM | POA: Insufficient documentation

## 2016-02-19 DIAGNOSIS — Y939 Activity, unspecified: Secondary | ICD-10-CM | POA: Insufficient documentation

## 2016-02-19 MED ORDER — HYDROCODONE-ACETAMINOPHEN 5-325 MG PO TABS
1.0000 | ORAL_TABLET | Freq: Once | ORAL | Status: AC
  Administered 2016-02-19: 1 via ORAL
  Filled 2016-02-19: qty 1

## 2016-02-19 MED ORDER — HYDROCODONE-ACETAMINOPHEN 5-325 MG PO TABS: 1.0000 | ORAL_TABLET | ORAL | Status: DC | PRN

## 2016-02-19 MED ORDER — NAPROXEN 500 MG PO TABS: 500.0000 mg | ORAL_TABLET | Freq: Two times a day (BID) | ORAL | Status: DC

## 2016-02-19 NOTE — ED Provider Notes (Signed)
[facility] Emergency Department Provider Note ____________________________________________  Time seen: Approximately 8:59 PM  I have reviewed the triage vital signs and the nursing notes.   HISTORY  Chief Complaint Arm Pain    HPI Karina Neal is a 45 y.o. female who presents to the emergency department for evaluation of right arm pain. About 3 weeks ago, she fell down the stairs and landed on her right elbow. She has been taking Tylenol and Advil with minimal relief. She states that over the past few days, the pain has been increasing and she has noticed some swelling in the right upper forearm and just above the right elbow. She states the pain worsens when she fully extends the elbow and attempts to supinate the right hand.  History reviewed. No pertinent past medical history.  Patient Active Problem List   Diagnosis Date Noted  . Complicated migraine   . Left sided numbness 04/23/2015  . Stroke [facility]) 04/23/2015    Past Surgical History  Procedure Laterality Date  . Cesarean section    . Eye surgery    . Appendectomy      Current Outpatient Rx  Name  Route  Sig  Dispense  Refill  . acetaminophen (TYLENOL) 500 MG tablet   Oral   Take 1 tablet (500 mg total) by mouth every 6 (six) hours as needed for pain.   30 tablet   0   . HYDROcodone-acetaminophen (NORCO/VICODIN) 5-325 MG tablet   Oral   Take 1 tablet by mouth every 4 (four) hours as needed.   12 tablet   0   . naproxen (NAPROSYN) 500 MG tablet   Oral   Take 1 tablet (500 mg total) by mouth 2 (two) times daily with a meal.   30 tablet   0     Allergies Shellfish allergy and Penicillins  Family History  Problem Relation Age of Onset  . Hypertension Mother   . Hypertension Father   . Hypertension Sister   . Hypertension Brother   . Hyperlipidemia Mother     Social History Social History  Substance Use Topics  . Smoking status: Never Smoker   . Smokeless  tobacco: None  . Alcohol Use: No    Review of Systems Constitutional: No recent illness. Cardiovascular: Denies chest pain or palpitations. Respiratory: Denies shortness of breath. Musculoskeletal: Pain in Right forearm Skin: Negative for rash, wound, lesion. Neurological: Negative for focal weakness or numbness.  ____________________________________________   PHYSICAL EXAM:  VITAL SIGNS: ED Triage Vitals  Enc Vitals Group     BP 02/19/16 1952 129/62 mmHg     Pulse Rate 02/19/16 1952 78     Resp 02/19/16 1952 16     Temp 02/19/16 1952 98.5 F (36.9 C)     Temp Source 02/19/16 1952 Oral     SpO2 02/19/16 1952 98 %     Weight 02/19/16 1952 180 lb (81.647 kg)     Height 02/19/16 1952 5\' 4"  (1.626 m)     Head Cir --      Peak Flow --      Pain Score 02/19/16 1954 10     Pain Loc --      Pain Edu? --      Excl. in GC? --     Constitutional: Alert and oriented. Well appearing and in no acute distress. Eyes: Conjunctivae are normal. EOMI. Head: Atraumatic. Neck: No stridor.  Respiratory: Normal respiratory effort.   Musculoskeletal: Right forearm tender over  the radial head. Pain increases with full extension of the right arm and supination of the right hand. Mild swelling noted over the proximal forearm. Neurologic:  Normal speech and language. No gross focal neurologic deficits are appreciated. Speech is normal. No gait instability. Skin:  Skin is warm, dry and intact. Atraumatic. Psychiatric: Mood and affect are normal. Speech and behavior are normal.  ____________________________________________   LABS (all labs ordered are listed, but only abnormal results are displayed)  Labs Reviewed - No data to display ____________________________________________  RADIOLOGY  Negative for acute bony abnormality per radiology.  I, Kem Boroughs, personally viewed and evaluated these images (plain radiographs) as part of my medical decision making, as well as reviewing the  written report by the radiologist.  ____________________________________________   PROCEDURES  Procedure(s) performed:   Sling was applied by our in prior to discharge.   ____________________________________________   INITIAL IMPRESSION / ASSESSMENT AND PLAN / ED COURSE  Pertinent labs & imaging results that were available during my care of the patient were reviewed by me and considered in my medical decision making (see chart for details).  Patient was instructed to wear the sling when out of bed for the next few days. She was encouraged to call and schedule follow-up appointment with orthopedics if symptoms are not improving over the week. She was given prescription for Norco and Naprosyn. She was encouraged to return to the emergency department for symptoms that change or worsen if she is unable to schedule an appointment with orthopedics. ____________________________________________   FINAL CLINICAL IMPRESSION(S) / ED DIAGNOSES  Final diagnoses:  Elbow injury, right, initial encounter       Karina Pester, FNP 02/19/16 2139  Loleta Rose, MD 02/19/16 2313

## 2016-02-19 NOTE — ED Notes (Signed)
Pt arrived to ED reporting right elbow and upper arm pain beginning three weeks ago after falling down the stairs at work. Pt in NAD at this time but reports ibuprofen at home was not decreasing pain and pt is beginning to have trouble holding objects in hands. No swelling noted at this time.

## 2016-04-10 ENCOUNTER — Encounter: Payer: Self-pay | Admitting: Primary Care

## 2016-04-10 ENCOUNTER — Other Ambulatory Visit (HOSPITAL_COMMUNITY)
Admission: RE | Admit: 2016-04-10 | Discharge: 2016-04-10 | Disposition: A | Discharge status: Home / Self Care | Payer: Self-pay | Source: Ambulatory Visit | Attending: Primary Care | Admitting: Primary Care

## 2016-04-10 ENCOUNTER — Ambulatory Visit (INDEPENDENT_AMBULATORY_CARE_PROVIDER_SITE_OTHER): Payer: Self-pay | Admitting: Primary Care

## 2016-04-10 ENCOUNTER — Ambulatory Visit (INDEPENDENT_AMBULATORY_CARE_PROVIDER_SITE_OTHER)
Admission: RE | Admit: 2016-04-10 | Discharge: 2016-04-10 | Disposition: A | Discharge status: Home / Self Care | Payer: Self-pay | Source: Ambulatory Visit | Attending: Primary Care | Admitting: Primary Care

## 2016-04-10 VITALS — BP 124/84 | HR 87 | Temp 98.2°F | Ht 64.0 in | Wt 205.8 lb

## 2016-04-10 DIAGNOSIS — Z1151 Encounter for screening for human papillomavirus (HPV): Secondary | ICD-10-CM | POA: Insufficient documentation

## 2016-04-10 DIAGNOSIS — Z23 Encounter for immunization: Secondary | ICD-10-CM

## 2016-04-10 DIAGNOSIS — Z01419 Encounter for gynecological examination (general) (routine) without abnormal findings: Secondary | ICD-10-CM | POA: Insufficient documentation

## 2016-04-10 DIAGNOSIS — M79601 Pain in right arm: Secondary | ICD-10-CM

## 2016-04-10 DIAGNOSIS — Z1239 Encounter for other screening for malignant neoplasm of breast: Secondary | ICD-10-CM

## 2016-04-10 DIAGNOSIS — Z124 Encounter for screening for malignant neoplasm of cervix: Secondary | ICD-10-CM

## 2016-04-10 DIAGNOSIS — Z0000 Encounter for general adult medical examination without abnormal findings: Secondary | ICD-10-CM | POA: Insufficient documentation

## 2016-04-10 NOTE — Assessment & Plan Note (Signed)
Tetanus overdue, provided today. Pap due, performed today and is pending. Mammogram due, orders placed. Discussed the importance of a healthy diet and regular exercise in order for weight loss and to reduce risk of other medical diseases. Exam mostly unremarkable except for right upper extremity pain and weakness. She is not fasting today so will return for fasting labs later this week.  Follow-up in one year for repeat physical.

## 2016-04-10 NOTE — Progress Notes (Addendum)
Subjective:    Patient ID: Karina Neal, female    DOB: 04-Jan-1971, 45 y.o.   MRN: [id number]  HPI  Karina Neal is a 45 year old female who presents today to establish care, for complete physical, and discuss the problems mentioned below. Will obtain old records.  1) Elevated Blood Pressure Reading: BP stable in the clinic today. History of elevated readings in the past. She has never been managed on medication in the past. She does not check her BP at home.  2) Upper extremity pain: Present to right elbow, wrist, and forearm since fall 3 months ago. Overall she's feeling about the same. She will experience intermittent numbness/tingling. NO improvement with hydrocodone and Naproxen. She has experience weakness and will often drop objects. She has a lot of pain to her right anterior forearm, especially with twisting. Denies wrist pain, shoulder pain.   Immunizations: -Tetanus: Unsure. -Influenza: Did not complete last season.  Diet: She endorses a poor diet. Breakfast: Skips Lunch: Skips Dinner: Fried chicken, beans, greens, salads Snacks: Occasionally Desserts: None Beverages: Sodas, juice, some water  Exercise: She does not currently exercise Eye exam: Completed 3 years ago, no changes in vision Dental exam: Completed 5 years ago Pap Smear: Completed 3 years ago Mammogram: Never completed    Review of Systems  Constitutional: Negative for unexpected weight change.  HENT: Negative for rhinorrhea.   Respiratory: Negative for cough, shortness of breath and wheezing.   Cardiovascular: Negative for chest pain.  Gastrointestinal: Negative for constipation and diarrhea.  Genitourinary: Negative for difficulty urinating and menstrual problem.  Musculoskeletal: Positive for arthralgias. Negative for myalgias.       Right elbow, knee, and leg pain.   Skin: Negative for rash.  Allergic/Immunologic: Positive for environmental allergies.  Neurological: Positive for numbness. Negative  for dizziness and headaches.  Psychiatric/Behavioral:       Denies concerns for anxiety and depression       Past Medical History:  Diagnosis Date  . Asthma   . Blood pressure elevated   . Chicken pox   . Frequent headaches      Social History   Social History  . Marital status: Married    Spouse name: N/A  . Number of children: N/A  . Years of education: N/A   Occupational History  . Not on file.   Social History Main Topics  . Smoking status: Never Smoker  . Smokeless tobacco: Never Used  . Alcohol use Yes     Comment: social  . Drug use: No  . Sexual activity: Not on file   Other Topics Concern  . Not on file   Social History Narrative  . No narrative on file    Past Surgical History:  Procedure Laterality Date  . APPENDECTOMY    . CESAREAN SECTION      Family History  Problem Relation Age of Onset  . Hyperlipidemia Mother   . Hyperlipidemia Father   . Hypertension Father   . Diabetes Father     Allergies  Allergen Reactions  . Penicillins Hives  . Shellfish Allergy Hives    No current outpatient prescriptions on file prior to visit.   No current facility-administered medications on file prior to visit.     BP 124/84   Pulse 87   Temp 98.2 F (36.8 C) (Oral)   Ht  (1.626 m)   Wt 205 lb 12.8 oz (93.4 kg)   LMP 04/08/2016   SpO2 97%   BMI  35.33 kg/m    Objective:   Physical Exam  Constitutional: She is oriented to person, place, and time. She appears well-nourished.  HENT:  Right Ear: Tympanic membrane and ear canal normal.  Left Ear: Tympanic membrane and ear canal normal.  Nose: Nose normal.  Mouth/Throat: Oropharynx is clear and moist.  Eyes: Conjunctivae and EOM are normal. Pupils are equal, round, and reactive to light.  Neck: Neck supple. No thyromegaly present.  Cardiovascular: Normal rate and regular rhythm.   No murmur heard. Pulmonary/Chest: Effort normal and breath sounds normal. She has no rales.  Abdominal:  Soft. Bowel sounds are normal. There is no tenderness.  Musculoskeletal:       Right wrist: She exhibits decreased range of motion. She exhibits no tenderness, no bony tenderness and no deformity.       Right forearm: She exhibits tenderness and swelling. She exhibits no bony tenderness.  Significant decrease in range of motion to right forearm and wrist due to forearm pain.  Lymphadenopathy:    She has no cervical adenopathy.  Neurological: She is alert and oriented to person, place, and time. She has normal reflexes. No cranial nerve deficit.  Skin: Skin is warm and dry. No rash noted.  Psychiatric: She has a normal mood and affect.          Assessment & Plan:  Upper extremity pain:  Located to right forearm since fall 3 months ago. Evaluated in the emergency department in June, normal x-ray of elbow. No improvement with OTC treatment and hydrocodone. Exam today with moderate decrease in range of motion and strength to right forearm and wrist due to pain of forearm. Will obtain x-ray to right forearm for further evaluation,  no obvious deformity but swelling present.  Morrie Sheldon, NP

## 2016-04-10 NOTE — Patient Instructions (Addendum)
You were provided with a tetanus vaccination which will cover you for 10 years.   Schedule a lab only appointment up front to return for your labs. Please ensure you are fasting for 4 hours prior to this appointment. You may have water and black coffee.  It is important that you improve your diet. Please limit carbohydrates in the form of white bread, rice, pasta, fried foods, fatty foods, sugary drinks, etc. Increase your consumption of fresh fruits and vegetables, whole grains, lean protein.  Ensure you are consuming 64 ounces of water daily.  Start exercising. You should be getting 1 hour of moderate intensity exercise 3-4 days weekly.  I will notify you of your pap and lab results once received.   It was a pleasure to meet you today! Please don't hesitate to call me with any questions. Welcome to [facility]!

## 2016-04-10 NOTE — Progress Notes (Signed)
Pre visit review using our clinic review tool, if applicable. No additional management support is needed unless otherwise documented below in the visit note. 

## 2016-04-11 ENCOUNTER — Encounter: Payer: Self-pay | Admitting: *Deleted

## 2016-04-12 ENCOUNTER — Other Ambulatory Visit (INDEPENDENT_AMBULATORY_CARE_PROVIDER_SITE_OTHER): Payer: Self-pay

## 2016-04-12 ENCOUNTER — Other Ambulatory Visit: Payer: Self-pay | Admitting: Primary Care

## 2016-04-12 DIAGNOSIS — E559 Vitamin D deficiency, unspecified: Secondary | ICD-10-CM

## 2016-04-12 DIAGNOSIS — Z0000 Encounter for general adult medical examination without abnormal findings: Secondary | ICD-10-CM

## 2016-04-12 DIAGNOSIS — M79601 Pain in right arm: Secondary | ICD-10-CM

## 2016-04-12 LAB — COMPREHENSIVE METABOLIC PANEL
ALT: 13 U/L (ref 0–35)
AST: 15 U/L (ref 0–37)
Albumin: 3.9 g/dL (ref 3.5–5.2)
Alkaline Phosphatase: 49 U/L (ref 39–117)
BUN: 9 mg/dL (ref 6–23)
CO2: 27 mEq/L (ref 19–32)
Calcium: 9.1 mg/dL (ref 8.4–10.5)
Chloride: 105 mEq/L (ref 96–112)
Creatinine, Ser: 0.73 mg/dL (ref 0.40–1.20)
GFR: 111.23 mL/min (ref >60.00)
Glucose, Bld: 92 mg/dL (ref 70–99)
Potassium: 3.8 mEq/L (ref 3.5–5.1)
Sodium: 139 mEq/L (ref 135–145)
Total Bilirubin: 0.5 mg/dL (ref 0.2–1.2)
Total Protein: 7.1 g/dL (ref 6.0–8.3)

## 2016-04-12 LAB — LIPID PANEL
Cholesterol: 155 mg/dL (ref 0–200)
HDL: 46.1 mg/dL (ref >39.00)
LDL Cholesterol: 96 mg/dL (ref 0–99)
NonHDL: 109.08
Total CHOL/HDL Ratio: 3
Triglycerides: 65 mg/dL (ref 0.0–149.0)
VLDL: 13 mg/dL (ref 0.0–40.0)

## 2016-04-12 LAB — CBC
HCT: 35.2 % — ABNORMAL LOW (ref 36.0–46.0)
Hemoglobin: 11.8 g/dL — ABNORMAL LOW (ref 12.0–15.0)
MCHC: 33.4 g/dL (ref 30.0–36.0)
MCV: 86.9 fl (ref 78.0–100.0)
Platelets: 152 10*3/uL (ref 150.0–400.0)
RBC: 4.05 Mil/uL (ref 3.87–5.11)
RDW: 14.2 % (ref 11.5–15.5)
WBC: 5.9 10*3/uL (ref 4.0–10.5)

## 2016-04-12 LAB — HEMOGLOBIN A1C: Hgb A1c MFr Bld: 5.3 % (ref 4.6–6.5)

## 2016-04-12 LAB — CYTOLOGY - PAP

## 2016-04-12 LAB — VITAMIN D 25 HYDROXY (VIT D DEFICIENCY, FRACTURES): VITD: 15.48 ng/mL — ABNORMAL LOW (ref 30.00–100.00)

## 2016-04-12 MED ORDER — NAPROXEN 500 MG PO TABS: 500.0000 mg | ORAL_TABLET | Freq: Two times a day (BID) | ORAL | 0 refills | Status: DC

## 2016-04-12 MED ORDER — VITAMIN D (ERGOCALCIFEROL) 1.25 MG (50000 UNIT) PO CAPS: ORAL_CAPSULE | ORAL | 0 refills | Status: DC

## 2016-05-10 ENCOUNTER — Emergency Department
Admission: EM | Admit: 2016-05-10 | Discharge: 2016-05-10 | Disposition: A | Discharge status: Home / Self Care | Payer: Self-pay | Attending: Emergency Medicine | Admitting: Emergency Medicine

## 2016-05-10 DIAGNOSIS — J45909 Unspecified asthma, uncomplicated: Secondary | ICD-10-CM | POA: Insufficient documentation

## 2016-05-10 DIAGNOSIS — M5442 Lumbago with sciatica, left side: Secondary | ICD-10-CM | POA: Insufficient documentation

## 2016-05-10 DIAGNOSIS — Z8673 Personal history of transient ischemic attack (TIA), and cerebral infarction without residual deficits: Secondary | ICD-10-CM | POA: Insufficient documentation

## 2016-05-10 LAB — URINALYSIS COMPLETE WITH MICROSCOPIC (ARMC ONLY)
Bilirubin Urine: NEGATIVE
Glucose, UA: NEGATIVE mg/dL
Ketones, ur: NEGATIVE mg/dL
Leukocytes, UA: NEGATIVE
Nitrite: NEGATIVE
Protein, ur: NEGATIVE mg/dL
Specific Gravity, Urine: 1.023 (ref 1.005–1.030)
pH: 5 (ref 5.0–8.0)

## 2016-05-10 MED ORDER — CYCLOBENZAPRINE HCL 5 MG PO TABS: 5.0000 mg | ORAL_TABLET | Freq: Three times a day (TID) | ORAL | 0 refills | Status: DC | PRN

## 2016-05-10 MED ORDER — KETOROLAC TROMETHAMINE 30 MG/ML IJ SOLN
30.0000 mg | Freq: Once | INTRAMUSCULAR | Status: DC
  Filled 2016-05-10: qty 1

## 2016-05-10 MED ORDER — KETOROLAC TROMETHAMINE 30 MG/ML IJ SOLN
30.0000 mg | Freq: Once | INTRAMUSCULAR | Status: AC
  Administered 2016-05-10: 30 mg via INTRAMUSCULAR

## 2016-05-10 MED ORDER — KETOROLAC TROMETHAMINE 10 MG PO TABS: 10.0000 mg | ORAL_TABLET | Freq: Three times a day (TID) | ORAL | 0 refills | Status: DC

## 2016-05-10 NOTE — ED Triage Notes (Signed)
Pt c/o lower back pain for the past couple of days.. Denies injury

## 2016-05-10 NOTE — Discharge Instructions (Signed)
Your exam shows signs of a sciatic nerve irritation on the left leg. Take the prescription meds as directed. Apply ice to reduce symptoms. Follow-up with your provider for continued symptoms.

## 2016-05-10 NOTE — ED Provider Notes (Signed)
[facility] Emergency Department Provider Note ____________________________________________  Time seen: 1503  I have reviewed the triage vital signs and the nursing notes.  HISTORY  Chief Complaint  Back Pain  HPI Karina Neal is a 45 y.o. female visit to the ED with left low back pain and left lotion a referral for the last 24 hours. Patient denies any recent injury, accident, or trauma. She describes onset was at work yesterday while standing, at about 11 AM. She describes numbness and tingling to the posterior aspect of the left leg. She does report previously experiencing similar symptoms in the back, but notes that symptoms are generally not beyond the knee. She denies any interim foot drop, bladder or bowel incontinence, or saddle paresthesias. She has dosed Aleve with limited benefit.  Past Medical History:  Diagnosis Date  . Asthma   . Blood pressure elevated   . Chicken pox   . Frequent headaches     Patient Active Problem List   Diagnosis Date Noted  . Preventative health care 04/10/2016  . Complicated migraine   . Left sided numbness 04/23/2015  . Stroke [facility]) 04/23/2015    Past Surgical History:  Procedure Laterality Date  . APPENDECTOMY    . CESAREAN SECTION    . CESAREAN SECTION    . EYE SURGERY      Prior to Admission medications   Medication Sig Start Date End Date Taking? Authorizing Provider  acetaminophen (TYLENOL) 500 MG tablet Take 1 tablet (500 mg total) by mouth every 6 (six) hours as needed for pain. 03/04/13   Junius Finner, PA-C  cyclobenzaprine (FLEXERIL) 5 MG tablet Take 1 tablet (5 mg total) by mouth 3 (three) times daily as needed for muscle spasms. 05/10/16   Wiletta Bermingham V Bacon Necha Harries, PA-C  HYDROcodone-acetaminophen (NORCO/VICODIN) 5-325 MG tablet Take 1 tablet by mouth every 4 (four) hours as needed. 02/19/16   Chinita Pester, FNP  ketorolac (TORADOL) 10 MG tablet Take 1 tablet (10 mg total) by mouth every 8 (eight) hours.  05/10/16   Gabriele Loveland V Bacon Sarp Vernier, PA-C  naproxen (NAPROSYN) 500 MG tablet Take 1 tablet (500 mg total) by mouth 2 (two) times daily with a meal. 04/12/16   Doreene Nest, NP  Vitamin D, Ergocalciferol, (DRISDOL) 50000 units CAPS capsule Take 1 capsule by mouth once weekly for a total of 12 weeks. 04/12/16   Doreene Nest, NP   Allergies Shellfish allergy; Penicillins; Penicillins; and Shellfish allergy  Family History  Problem Relation Age of Onset  . Hyperlipidemia Mother   . Hyperlipidemia Father   . Hypertension Father   . Diabetes Father   . Hypertension Mother   . Hypertension Sister   . Hypertension Brother     Social History Social History  Substance Use Topics  . Smoking status: Never Smoker  . Smokeless tobacco: Never Used  . Alcohol use Yes     Comment: social   Review of Systems  Constitutional: Negative for fever. Cardiovascular: Negative for chest pain. Respiratory: Negative for shortness of breath. Gastrointestinal: Negative for abdominal pain, vomiting and diarrhea. Genitourinary: Negative for dysuria. Musculoskeletal: Positive for back pain. Skin: Negative for rash. Neurological: Negative for headaches, focal weakness or numbness. ____________________________________________  PHYSICAL EXAM:  VITAL SIGNS: ED Triage Vitals [05/10/16 1359]  Enc Vitals Group     BP 133/82     Pulse Rate 98     Resp 16     Temp 98.2 F (36.8 C)  Temp Source Oral     SpO2 98 %     Weight 190 lb (86.2 kg)     Height 5\' 4"  (1.626 m)     Head Circumference      Peak Flow      Pain Score 10     Pain Loc      Pain Edu?      Excl. in GC?    Constitutional: Alert and oriented. Well appearing and in no distress. Head: Normocephalic and atraumatic. Cardiovascular: Normal rate, regular rhythm.  Respiratory: Normal respiratory effort. No wheezes/rales/rhonchi. Gastrointestinal: Soft and nontender. No distention.No CVA tenderness. Musculoskeletal: Normal spinal  alignment without midline tenderness, spasm, deformity, or step-off. Patient with a negative seated straight leg raise bilaterally. Patient transitioned from sit to stand without assistance. She is intervention normal toe raise heel raise on exam. Nontender with normal range of motion in all extremities.  Neurologic: Cranial nerves II through XII grossly intact. Normal LE DTRs bilaterally. Normal gait without ataxia. Normal speech and language. No gross focal neurologic deficits are appreciated. Skin:  Skin is warm, dry and intact. No rash noted. ____________________________________________   RADIOLOGY  Not indicated ____________________________________________  PROCEDURES  Toradol 30 mg IM ____________________________________________  INITIAL IMPRESSION / ASSESSMENT AND PLAN / ED COURSE  Patient with acute lumbar sacral strain with her left lower extremity sciatic irritation. She is discharged with a prescription for ketorolac and Flexeril. She is advised to apply ice to the low back for comfort. She should follow up with her primary care provider for ongoing symptom management. Return precautions are reviewed.  Clinical Course   ____________________________________________  FINAL CLINICAL IMPRESSION(S) / ED DIAGNOSES  Final diagnoses:  Left-sided low back pain with left-sided sciatica      Lissa HoardJenise V Bacon Miqueas Whilden, PA-C 05/10/16 1648    Loleta Roseory Forbach, MD 05/10/16 [phone]

## 2016-07-05 ENCOUNTER — Other Ambulatory Visit: Payer: Self-pay

## 2017-05-27 DIAGNOSIS — R51 Headache: Secondary | ICD-10-CM | POA: Insufficient documentation

## 2017-05-27 NOTE — ED Triage Notes (Signed)
Patient ambulatory to triage with steady gait, without difficulty or distress noted; pt reports intermittent frontal HA x week with no accomp symptoms; denies hx of same

## 2017-05-28 ENCOUNTER — Emergency Department
Admission: EM | Admit: 2017-05-28 | Discharge: 2017-05-28 | Disposition: A | Discharge status: Home / Self Care | Payer: Self-pay | Attending: Emergency Medicine | Admitting: Emergency Medicine

## 2017-05-28 ENCOUNTER — Telehealth: Payer: Self-pay | Admitting: Emergency Medicine

## 2017-05-28 NOTE — Telephone Encounter (Signed)
Called patient due to lwot to inquire about condition and follow up plans.  Someone answered and then hung up

## 2017-05-31 ENCOUNTER — Encounter (HOSPITAL_COMMUNITY): Payer: Self-pay | Admitting: Emergency Medicine

## 2017-05-31 ENCOUNTER — Emergency Department (HOSPITAL_COMMUNITY)
Admission: EM | Admit: 2017-05-31 | Discharge: 2017-06-01 | Disposition: A | Discharge status: Home / Self Care | Payer: Self-pay | Attending: Emergency Medicine | Admitting: Emergency Medicine

## 2017-05-31 DIAGNOSIS — R51 Headache: Secondary | ICD-10-CM | POA: Insufficient documentation

## 2017-05-31 DIAGNOSIS — Z8673 Personal history of transient ischemic attack (TIA), and cerebral infarction without residual deficits: Secondary | ICD-10-CM | POA: Insufficient documentation

## 2017-05-31 DIAGNOSIS — R030 Elevated blood-pressure reading, without diagnosis of hypertension: Secondary | ICD-10-CM | POA: Insufficient documentation

## 2017-05-31 DIAGNOSIS — H53149 Visual discomfort, unspecified: Secondary | ICD-10-CM | POA: Insufficient documentation

## 2017-05-31 DIAGNOSIS — R519 Headache, unspecified: Secondary | ICD-10-CM

## 2017-05-31 DIAGNOSIS — R110 Nausea: Secondary | ICD-10-CM | POA: Insufficient documentation

## 2017-05-31 DIAGNOSIS — R42 Dizziness and giddiness: Secondary | ICD-10-CM | POA: Insufficient documentation

## 2017-05-31 MED ORDER — OXYCODONE-ACETAMINOPHEN 5-325 MG PO TABS: 1.0000 | ORAL_TABLET | ORAL | Status: DC | PRN

## 2017-05-31 MED ORDER — OXYCODONE-ACETAMINOPHEN 5-325 MG PO TABS
ORAL_TABLET | ORAL | Status: AC
  Administered 2017-05-31: 1
  Filled 2017-05-31: qty 1

## 2017-05-31 MED ORDER — DIPHENHYDRAMINE HCL 50 MG/ML IJ SOLN
50.0000 mg | Freq: Once | INTRAMUSCULAR | Status: AC
Start: 2017-06-01 — End: 2017-06-01
  Administered 2017-06-01: 50 mg via INTRAVENOUS
  Filled 2017-05-31: qty 1

## 2017-05-31 MED ORDER — KETOROLAC TROMETHAMINE 30 MG/ML IJ SOLN
30.0000 mg | Freq: Once | INTRAMUSCULAR | Status: AC
  Administered 2017-06-01: 30 mg via INTRAVENOUS
  Filled 2017-05-31: qty 1

## 2017-05-31 MED ORDER — SODIUM CHLORIDE 0.9 % IV BOLUS (SEPSIS)
1000.0000 mL | Freq: Once | INTRAVENOUS | Status: AC
  Administered 2017-06-01: 1000 mL via INTRAVENOUS

## 2017-05-31 MED ORDER — PROCHLORPERAZINE EDISYLATE 5 MG/ML IJ SOLN
10.0000 mg | Freq: Once | INTRAMUSCULAR | Status: AC
  Administered 2017-06-01: 10 mg via INTRAVENOUS
  Filled 2017-05-31: qty 2

## 2017-05-31 NOTE — ED Triage Notes (Addendum)
Pt states 2 LWBS for evaluation of headaches that have been here for 2 weeks. Pt does have HTN, not currently taking anything for this. PT states headaches are worse with standing. Pain 8/10. Pt has no history of HTN and states unsure if the pain from headache is causing the HTN or if the HTN is causing the headahces. Will medicate for acute pain in triage and reasses blood pressure.

## 2017-05-31 NOTE — ED Provider Notes (Signed)
[facility]-EMERGENCY DEPT Provider Note   CSN: [id number] Arrival date & time: 05/31/17  1801     History   Chief Complaint Chief Complaint  Patient presents with  . Headache  . Hypertension    HPI Karina Neal is a 46 y.o. female with past medical history significant for asthma, frequent headaches, complicated migraine, and left-sided facial numbness presents today with gradual onset, constant frontal headache for 2 weeks. She states that headache began gradually and worsened for 3 days until staying at 8/10 in severity. Headache is frontal, does not radiate, and is described as a throbbing pain. Associated symptoms include nausea, lightheadedness, and photophobia. Standing worsens her pain, laying down alleviates her pain. She denies any recent trauma or falls, she is not on any anticoagulant therapy. She denies CP, SOB, fevers, neck pain or stiffness, abdominal pain, vomiting, or any other pain. She has tried ibuprofen, Tylenol without any significant relief of her symptoms. Of note, she was admitted to the hospital August 2016 for stroke workup, but this was found to be negative. She denies dizziness, vision changes syncope, weakness, numbness, tingling, difficulty ambulating, facial droop, or slurred speech. She is a nonsmoker and denies any illicit drug use.   The history is provided by the patient.  Hypertension  Associated symptoms include headaches. Pertinent negatives include no chest pain, no abdominal pain and no shortness of breath.    Past Medical History:  Diagnosis Date  . Asthma   . Blood pressure elevated   . Chicken pox   . Frequent headaches     Patient Active Problem List   Diagnosis Date Noted  . Preventative health care 04/10/2016  . Complicated migraine   . Left sided numbness 04/23/2015  . Stroke [facility]) 04/23/2015    Past Surgical History:  Procedure Laterality Date  . APPENDECTOMY    . CESAREAN SECTION    . CESAREAN SECTION    . EYE SURGERY      OB  History    No data available       Home Medications    Prior to Admission medications   Medication Sig Start Date End Date Taking? Authorizing Provider  acetaminophen (TYLENOL) 500 MG tablet Take 1 tablet (500 mg total) by mouth every 6 (six) hours as needed for pain. Patient not taking: Reported on 06/01/2017 03/04/13   Lurene Shadow, PA-C  cyclobenzaprine (FLEXERIL) 5 MG tablet Take 1 tablet (5 mg total) by mouth 3 (three) times daily as needed for muscle spasms. Patient not taking: Reported on 06/01/2017 05/10/16   Menshew, Charlesetta Ivory, PA-C  HYDROcodone-acetaminophen (NORCO/VICODIN) 5-325 MG tablet Take 1 tablet by mouth every 4 (four) hours as needed. Patient not taking: Reported on 06/01/2017 02/19/16   Kem Boroughs B, FNP  ketorolac (TORADOL) 10 MG tablet Take 1 tablet (10 mg total) by mouth every 8 (eight) hours. Patient not taking: Reported on 06/01/2017 05/10/16   Menshew, Charlesetta Ivory, PA-C  naproxen (NAPROSYN) 500 MG tablet Take 1 tablet (500 mg total) by mouth 2 (two) times daily with a meal. Patient not taking: Reported on 06/01/2017 04/12/16   Doreene Nest, NP  Vitamin D, Ergocalciferol, (DRISDOL) 50000 units CAPS capsule Take 1 capsule by mouth once weekly for a total of 12 weeks. Patient not taking: Reported on 06/01/2017 04/12/16   Doreene Nest, NP    Family History Family History  Problem Relation Age of Onset  . Hyperlipidemia Mother   . Hyperlipidemia Father   . Hypertension  Father   . Diabetes Father   . Hypertension Mother   . Hypertension Sister   . Hypertension Brother     Social History Social History  Substance Use Topics  . Smoking status: Never Smoker  . Smokeless tobacco: Never Used  . Alcohol use Yes     Comment: social     Allergies   Shellfish allergy; Penicillins; Penicillins; and Shellfish allergy   Review of Systems Review of Systems  Constitutional: Negative for chills and fever.  Eyes: Positive for photophobia. Negative  for visual disturbance.  Respiratory: Negative for shortness of breath.   Cardiovascular: Negative for chest pain.  Gastrointestinal: Positive for nausea. Negative for abdominal pain and vomiting.  Musculoskeletal: Negative for back pain, neck pain and neck stiffness.  Neurological: Positive for light-headedness and headaches. Negative for dizziness, syncope, facial asymmetry, speech difficulty, weakness and numbness.  All other systems reviewed and are negative.    Physical Exam Updated Vital Signs BP (!) 149/90 (BP Location: Left Arm)   Pulse 74   Temp 98.5 F (36.9 C) (Oral)   Resp 18   LMP 05/15/2017 (Exact Date)   SpO2 98%   Physical Exam  Constitutional: She is oriented to person, place, and time. She appears well-developed and well-nourished. No distress.  HENT:  Head: Normocephalic and atraumatic.  Right Ear: External ear normal.  Left Ear: External ear normal.  Mouth/Throat: Oropharynx is clear and moist.  Eyes: Pupils are equal, round, and reactive to light. Conjunctivae and EOM are normal. Right eye exhibits no discharge. Left eye exhibits no discharge.  Neck: Normal range of motion. Neck supple. No JVD present. No tracheal deviation present.  Cardiovascular: Normal rate, regular rhythm, normal heart sounds and intact distal pulses.   2+ radial and DP/PT pulses bl, negative Homan's bl   Pulmonary/Chest: Effort normal and breath sounds normal. No respiratory distress. She has no wheezes. She has no rales. She exhibits no tenderness.  Abdominal: Soft. Bowel sounds are normal. She exhibits no distension. There is no tenderness.  Musculoskeletal: Normal range of motion. She exhibits no edema or tenderness.  No midline spine TTP, no paraspinal muscle tenderness, no deformity, crepitus, or step-off noted. Normal range of motion of the spine. 5/5 strength of BUE and BLE major muscle groups, no deformity, crepitus, swelling, or tenderness on palpation of the extremities    Lymphadenopathy:    She has no cervical adenopathy.  Neurological: She is alert and oriented to person, place, and time. No cranial nerve deficit or sensory deficit. She exhibits normal muscle tone.  Mental Status:  Alert, thought content appropriate, able to give a coherent history. Speech fluent without evidence of aphasia. Able to follow 2 step commands without difficulty.  Cranial Nerves:  II:  Peripheral visual fields grossly normal, pupils equal, round, reactive to light III,IV, VI: ptosis not present, extra-ocular motions intact bilaterally  V,VII: smile symmetric, facial light touch sensation equal VIII: hearing grossly normal to voice  X: uvula elevates symmetrically  XI: bilateral shoulder shrug symmetric and strong XII: midline tongue extension without fassiculations Motor:  Normal tone. 5/5 strength of BUE and BLE major muscle groups including strong and equal grip strength and dorsiflexion/plantar flexion Sensory: light touch normal in all extremities. Cerebellar: normal finger-to-nose with bilateral upper extremities, normal heel-to-shin with BLE Gait: normal gait and balance. Able to walk on toes and heels with ease.  CV: 2+ radial and DP/PT pulses   Skin: Skin is warm and dry. No erythema.  Psychiatric: She  has a normal mood and affect. Her behavior is normal.  Nursing note and vitals reviewed.    ED Treatments / Results  Labs (all labs ordered are listed, but only abnormal results are displayed) Labs Reviewed - No data to display  EKG  EKG Interpretation None       Radiology No results found.  Procedures Procedures (including critical care time)  Medications Ordered in ED Medications  oxyCODONE-acetaminophen (PERCOCET/ROXICET) 5-325 MG per tablet 1 tablet (not administered)  oxyCODONE-acetaminophen (PERCOCET/ROXICET) 5-325 MG per tablet (1 tablet  Given 05/31/17 1812)  sodium chloride 0.9 % bolus 1,000 mL (1,000 mLs Intravenous New Bag/Given 06/01/17  0005)  ketorolac (TORADOL) 30 MG/ML injection 30 mg (30 mg Intravenous Given 06/01/17 0006)  diphenhydrAMINE (BENADRYL) injection 50 mg (50 mg Intravenous Given 06/01/17 0006)  prochlorperazine (COMPAZINE) injection 10 mg (10 mg Intravenous Given 06/01/17 0005)     Initial Impression / Assessment and Plan / ED Course  I have reviewed the triage vital signs and the nursing notes.  Pertinent labs & imaging results that were available during my care of the patient were reviewed by me and considered in my medical decision making (see chart for details).     Patient with frontal headache with photophobia and nausea for 2 weeks. Afebrile, vital signs are at patient's baseline, she is not significantly hypertensive. No focal neurological deficits on examination and no vision changes. No nuchal rigidity or fever to suggest meningitis. No risk factors concerning for [facility], ICH, or TBI. No history of trauma to suggest skull fracture. Doubt temporal arteritis as palpation of the skull and forehead is not tender. She was admitted in the past for stroke workup in 2016, which was found to be negative and was thought to be a complex migraine. Patient given migraine cocktail while in the ED, on reevaluation patient is resting comfortably, BP is 117/86 and states pain is 0/10; states she feels safe for discharge home. She will follow-up with primary care physician for reevaluation of her slightly elevated blood pressure and recurrent migraines. Discussed indications for return to the ED. Patient and patient's husband verbalized understanding of and agreement with plan and patient is stable for discharge home at this time.  Final Clinical Impressions(s) / ED Diagnoses   Final diagnoses:  Bad headache    New Prescriptions New Prescriptions   No medications on file     Bennye Alm 06/01/17 0100    Dione Booze, MD 06/01/17 [phone]

## 2017-06-01 NOTE — Discharge Instructions (Signed)
Alternate 600 mg of ibuprofen and [phone] mg of Tylenol every 3 hours as needed for pain. Do not exceed 4000 mg of Tylenol daily. May also try Excedrin Migraine over-the-counter for your headache. Follow up with a primary care physician for reevaluation of your slightly elevated blood pressure and recurrent headaches. Return to the ED immediately if any concerning signs or symptoms develop such as fever, neck stiffness, slurred speech, facial droop, numbness, tingling, weakness, or difficulty walking.

## 2017-07-10 ENCOUNTER — Encounter (HOSPITAL_COMMUNITY): Payer: Self-pay | Admitting: Emergency Medicine

## 2017-07-10 ENCOUNTER — Emergency Department (HOSPITAL_COMMUNITY)
Admission: EM | Admit: 2017-07-10 | Discharge: 2017-07-11 | Disposition: A | Discharge status: Home / Self Care | Payer: Self-pay | Attending: Emergency Medicine | Admitting: Emergency Medicine

## 2017-07-10 DIAGNOSIS — R1030 Lower abdominal pain, unspecified: Secondary | ICD-10-CM | POA: Insufficient documentation

## 2017-07-10 DIAGNOSIS — I10 Essential (primary) hypertension: Secondary | ICD-10-CM | POA: Insufficient documentation

## 2017-07-10 DIAGNOSIS — J45909 Unspecified asthma, uncomplicated: Secondary | ICD-10-CM | POA: Insufficient documentation

## 2017-07-10 DIAGNOSIS — N938 Other specified abnormal uterine and vaginal bleeding: Secondary | ICD-10-CM | POA: Insufficient documentation

## 2017-07-10 LAB — CBC
HCT: 36.5 % (ref 36.0–46.0)
Hemoglobin: 12 g/dL (ref 12.0–15.0)
MCH: 29.1 pg (ref 26.0–34.0)
MCHC: 32.9 g/dL (ref 30.0–36.0)
MCV: 88.6 fL (ref 78.0–100.0)
Platelets: 174 10*3/uL (ref 150–400)
RBC: 4.12 MIL/uL (ref 3.87–5.11)
RDW: 13.2 % (ref 11.5–15.5)
WBC: 6.9 10*3/uL (ref 4.0–10.5)

## 2017-07-10 LAB — BASIC METABOLIC PANEL
Anion gap: 7 (ref 5–15)
BUN: 11 mg/dL (ref 6–20)
CO2: 24 mmol/L (ref 22–32)
Calcium: 8.7 mg/dL — ABNORMAL LOW (ref 8.9–10.3)
Chloride: 106 mmol/L (ref 101–111)
Creatinine, Ser: 0.77 mg/dL (ref 0.44–1.00)
GFR calc Af Amer: >60 mL/min (ref >60)
GFR calc non Af Amer: >60 mL/min (ref >60)
Glucose, Bld: 98 mg/dL (ref 65–99)
Potassium: 3.6 mmol/L (ref 3.5–5.1)
Sodium: 137 mmol/L (ref 135–145)

## 2017-07-10 LAB — HCG, QUANTITATIVE, PREGNANCY: hCG, Beta Chain, Quant, S: <1 m[IU]/mL (ref <5)

## 2017-07-10 NOTE — ED Triage Notes (Signed)
Pt reports vaginal bleeding X3 weeks. Reports heavy at times with clots. No hx of same.

## 2017-07-11 ENCOUNTER — Emergency Department (HOSPITAL_COMMUNITY): Payer: Self-pay

## 2017-07-11 LAB — URINALYSIS, ROUTINE W REFLEX MICROSCOPIC
Bilirubin Urine: NEGATIVE
Glucose, UA: NEGATIVE mg/dL
Ketones, ur: NEGATIVE mg/dL
Leukocytes, UA: NEGATIVE
Nitrite: NEGATIVE
Protein, ur: NEGATIVE mg/dL
Specific Gravity, Urine: 1.026 (ref 1.005–1.030)
pH: 6 (ref 5.0–8.0)

## 2017-07-11 LAB — WET PREP, GENITAL
Sperm: NONE SEEN
Trich, Wet Prep: NONE SEEN
Yeast Wet Prep HPF POC: NONE SEEN

## 2017-07-11 LAB — GC/CHLAMYDIA PROBE AMP (~~LOC~~) NOT AT ARMC
Chlamydia: NEGATIVE
Neisseria Gonorrhea: NEGATIVE

## 2017-07-11 MED ORDER — IBUPROFEN 800 MG PO TABS
800.0000 mg | ORAL_TABLET | Freq: Once | ORAL | Status: AC
  Administered 2017-07-11: 800 mg via ORAL
  Filled 2017-07-11: qty 1

## 2017-07-11 NOTE — ED Notes (Signed)
Patient transported to Ultrasound 

## 2017-07-11 NOTE — ED Provider Notes (Signed)
TIME SEEN: 12:11 AM  CHIEF COMPLAINT: Vaginal bleeding  HPI: Patient is a 46 year old female with history of asthma, frequent headaches who presents to the emergency department with vaginal bleeding.  States that she normally has very regular menstrual cycles but this past menstrual cycle was 2 weeks late and then she started having heavier than normal bleeding with lower abdominal cramps and pressure and passing clots.  No fevers, nausea or vomiting, significant diarrhea.  No vaginal discharge.  No history of STDs.  She is a G3 P3.  States her OB/GYN was previously [facility] in [place].  She has had prior C-sections.  Patient is not on blood thinners.  ROS: See HPI Constitutional: no fever  Eyes: no drainage  ENT: no runny nose   Cardiovascular:  no chest pain  Resp: no SOB  GI: no vomiting GU: no dysuria Integumentary: no rash  Allergy: no hives  Musculoskeletal: no leg swelling  Neurological: no slurred speech ROS otherwise negative  PAST MEDICAL HISTORY/PAST SURGICAL HISTORY:  Past Medical History:  Diagnosis Date  . Asthma   . Blood pressure elevated   . Chicken pox   . Frequent headaches     MEDICATIONS:  Prior to Admission medications   Medication Sig Start Date End Date Taking? Authorizing Provider  acetaminophen (TYLENOL) 500 MG tablet Take 1 tablet (500 mg total) by mouth every 6 (six) hours as needed for pain. Patient not taking: Reported on 06/01/2017 03/04/13   Lurene Shadow, PA-C  cyclobenzaprine (FLEXERIL) 5 MG tablet Take 1 tablet (5 mg total) by mouth 3 (three) times daily as needed for muscle spasms. Patient not taking: Reported on 06/01/2017 05/10/16   Menshew, Charlesetta Ivory, PA-C  HYDROcodone-acetaminophen (NORCO/VICODIN) 5-325 MG tablet Take 1 tablet by mouth every 4 (four) hours as needed. Patient not taking: Reported on 06/01/2017 02/19/16   Kem Boroughs B, FNP  ketorolac (TORADOL) 10 MG tablet Take 1 tablet (10 mg total) by mouth every 8  (eight) hours. Patient not taking: Reported on 06/01/2017 05/10/16   Menshew, Charlesetta Ivory, PA-C  naproxen (NAPROSYN) 500 MG tablet Take 1 tablet (500 mg total) by mouth 2 (two) times daily with a meal. Patient not taking: Reported on 06/01/2017 04/12/16   Doreene Nest, NP  Vitamin D, Ergocalciferol, (DRISDOL) 50000 units CAPS capsule Take 1 capsule by mouth once weekly for a total of 12 weeks. Patient not taking: Reported on 06/01/2017 04/12/16   Doreene Nest, NP    ALLERGIES:  Allergies  Allergen Reactions  . Shellfish Allergy Anaphylaxis    Throat swelling, dyspnea  . Penicillins     Hives   . Penicillins Hives  . Shellfish Allergy Hives    SOCIAL HISTORY:  Social History  Substance Use Topics  . Smoking status: Never Smoker  . Smokeless tobacco: Never Used  . Alcohol use Yes     Comment: social    FAMILY HISTORY: Family History  Problem Relation Age of Onset  . Hyperlipidemia Mother   . Hyperlipidemia Father   . Hypertension Father   . Diabetes Father   . Hypertension Mother   . Hypertension Sister   . Hypertension Brother     EXAM: BP (!) 147/82 (BP Location: Left Arm)   Pulse 81   Temp 98.3 F (36.8 C) (Oral)   Resp 14   Ht 5\' 4"  (1.626 m)   Wt 81.6 kg (180 lb)   LMP 06/19/2017   SpO2 99%   BMI 30.90  kg/m  CONSTITUTIONAL: Alert and oriented and responds appropriately to questions. Well-appearing; well-nourished HEAD: Normocephalic EYES: Conjunctivae clear, pupils appear equal, EOMI ENT: normal nose; moist mucous membranes NECK: Supple, no meningismus, no nuchal rigidity, no LAD  CARD: RRR; S1 and S2 appreciated; no murmurs, no clicks, no rubs, no gallops RESP: Normal chest excursion without splinting or tachypnea; breath sounds clear and equal bilaterally; no wheezes, no rhonchi, no rales, no hypoxia or respiratory distress, speaking full sentences ABD/GI: Normal bowel sounds; non-distended; soft, non-tender, no rebound, no guarding, no  peritoneal signs, no hepatosplenomegaly GU:  Normal external genitalia. No lesions, rashes noted. Patient has mild amount of dark red vaginal bleeding on exam without any clots appreciated or pooling in the vaginal vault.  No vaginal discharge.  No adnexal tenderness, mass or fullness, no cervical motion tenderness. Cervix is not appear friable.  Cervix is closed.  Chaperone present for exam. BACK:  The back appears normal and is non-tender to palpation, there is no CVA tenderness EXT: Normal ROM in all joints; non-tender to palpation; no edema; normal capillary refill; no cyanosis, no calf tenderness or swelling    SKIN: Normal color for age and race; warm; no rash NEURO: Moves all extremities equally PSYCH: The patient's mood and manner are appropriate. Grooming and personal hygiene are appropriate.  MEDICAL DECISION MAKING: Patient here with heavier than normal vaginal bleeding.  She is hemodynamically stable without any hemorrhage here.  Hemoglobin is normal at 12.0.  Abdominal exam mildly tender over the lower abdomen.  Urine shows blood but no other sign of infection.  Wet prep is positive for clue cells but she has no odor, itching, burning, discharge.  I do not feel this needs to be treated.  Pregnancy test is negative.  Will obtain transvaginal ultrasound for further evaluation as this may be caused by a other abnormalities such as fibroids.  I did discuss with her if no abnormality found today since she is hemodynamically stable with normal hemoglobin and no hemorrhage that she would need close outpatient follow-up with an OB/GYN.  She is comfortable with this plan.  We will treat her pain with ibuprofen.  ED PROGRESS: Transvaginal ultrasound is unremarkable.  She has a right ovarian corpus luteum.  No fibroids.   Pain is been well controlled with ibuprofen.  Will discharge home with outpatient OB/GYN follow-up.  Again no hemorrhage.  I do not feel she needs emergent OB/GYN consultation or  admission.  Hemoglobin is normal.  Still hemodynamically stable.  Recommended alternating Tylenol and Motrin for pain.  Patient comfortable with this plan.   At this time, I do not feel there is any life-threatening condition present. I have reviewed and discussed all results (EKG, imaging, lab, urine as appropriate) and exam findings with patient/family. I have reviewed nursing notes and appropriate previous records.  I feel the patient is safe to be discharged home without further emergent workup and can continue workup as an outpatient as needed. Discussed usual and customary return precautions. Patient/family verbalize understanding and are comfortable with this plan.  Outpatient follow-up has been provided if needed. All questions have been answered.      Kasarah Sitts, Layla MawKristen N, DO 07/11/17 [phone]

## 2017-07-11 NOTE — Discharge Instructions (Signed)
You may alternate Tylenol 1000 mg every 6 hours as needed for pain and Ibuprofen 800 mg every 8 hours as needed for pain.  Please take Ibuprofen with food. ° ° ° ° °[facility] °[email or web address] °[address] °[place], [place] °[phone]  ° ° °[facility] °[email or web address] °[address] °[place], [place] °[phone]  ° ° °[facility] °[address] °[place], [place] °[phone]  ° °[facility] °[email or web address] °[address] °[place], [place] °[phone]  ° °[facility] °[email or web address] °[address] °[place], [place] °[phone]  ° °[facility] °[email or web address] °[address] °[place], [place] °[phone] ° ° °

## 2017-11-15 ENCOUNTER — Emergency Department (HOSPITAL_COMMUNITY): Payer: Self-pay

## 2017-11-15 ENCOUNTER — Encounter (HOSPITAL_COMMUNITY): Payer: Self-pay | Admitting: Emergency Medicine

## 2017-11-15 ENCOUNTER — Emergency Department (HOSPITAL_COMMUNITY)
Admission: EM | Admit: 2017-11-15 | Discharge: 2017-11-15 | Disposition: A | Discharge status: Home / Self Care | Payer: Self-pay | Attending: Emergency Medicine | Admitting: Emergency Medicine

## 2017-11-15 DIAGNOSIS — R05 Cough: Secondary | ICD-10-CM | POA: Insufficient documentation

## 2017-11-15 DIAGNOSIS — J45909 Unspecified asthma, uncomplicated: Secondary | ICD-10-CM | POA: Insufficient documentation

## 2017-11-15 DIAGNOSIS — R0789 Other chest pain: Secondary | ICD-10-CM | POA: Insufficient documentation

## 2017-11-15 DIAGNOSIS — Z8673 Personal history of transient ischemic attack (TIA), and cerebral infarction without residual deficits: Secondary | ICD-10-CM | POA: Insufficient documentation

## 2017-11-15 DIAGNOSIS — R059 Cough, unspecified: Secondary | ICD-10-CM

## 2017-11-15 LAB — BASIC METABOLIC PANEL
Anion gap: 10 (ref 5–15)
BUN: 11 mg/dL (ref 6–20)
CO2: 22 mmol/L (ref 22–32)
Calcium: 8.8 mg/dL — ABNORMAL LOW (ref 8.9–10.3)
Chloride: 105 mmol/L (ref 101–111)
Creatinine, Ser: 0.69 mg/dL (ref 0.44–1.00)
GFR calc Af Amer: >60 mL/min (ref >60)
GFR calc non Af Amer: >60 mL/min (ref >60)
Glucose, Bld: 90 mg/dL (ref 65–99)
Potassium: 3.8 mmol/L (ref 3.5–5.1)
Sodium: 137 mmol/L (ref 135–145)

## 2017-11-15 LAB — CBC
HCT: 34.8 % — ABNORMAL LOW (ref 36.0–46.0)
Hemoglobin: 11 g/dL — ABNORMAL LOW (ref 12.0–15.0)
MCH: 27.8 pg (ref 26.0–34.0)
MCHC: 31.6 g/dL (ref 30.0–36.0)
MCV: 87.9 fL (ref 78.0–100.0)
Platelets: 145 10*3/uL — ABNORMAL LOW (ref 150–400)
RBC: 3.96 MIL/uL (ref 3.87–5.11)
RDW: 14.2 % (ref 11.5–15.5)
WBC: 5.2 10*3/uL (ref 4.0–10.5)

## 2017-11-15 LAB — I-STAT TROPONIN, ED
Troponin i, poc: 0 ng/mL (ref 0.00–0.08)
Troponin i, poc: 0 ng/mL (ref 0.00–0.08)

## 2017-11-15 LAB — I-STAT BETA HCG BLOOD, ED (MC, WL, AP ONLY): I-stat hCG, quantitative: <5 m[IU]/mL (ref <5)

## 2017-11-15 MED ORDER — AZITHROMYCIN 250 MG PO TABS: 250.0000 mg | ORAL_TABLET | Freq: Every day | ORAL | 0 refills | Status: AC

## 2017-11-15 NOTE — ED Provider Notes (Signed)
[facility] EMERGENCY DEPARTMENT Provider Note   CSN: [id number] Arrival date & time: 11/15/17  1509     History   Chief Complaint Chief Complaint  Patient presents with  . Flu-like Symptoms    HPI Karina Neal is a 47 y.o. female is here for evaluation of central upper chest pain for one week described as "pressure", constant, worse with direct palpation, movement of the trunk, movement of the arms, deep breathing, coughing. Also reporting similar discomfort to the left shoulder blade for 1 week as well, exacerbated by the same. Other associated symptoms include cough with productive green sputum, chills, fatigue, generalized malaise, improving nasal congestion. Hasn't taken Tylenol and NyQuil which provided only temporary, mild relief. States that symptoms started while she was in a trip in [place], symptoms worse after she got home. States both of her children were sick 2 weeks ago, her husband is currently having a cough as well.   Denies any fevers, nausea, vomiting, diarrhea, constipation, abdominal pain,Shortness of breath, lower extremity edema or calf pain. No history DVT/PE. Plan right to left leg is was brought 5 hours long.  HPI  Past Medical History:  Diagnosis Date  . Asthma   . Blood pressure elevated   . Chicken pox   . Frequent headaches     Patient Active Problem List   Diagnosis Date Noted  . Preventative health care 04/10/2016  . Complicated migraine   . Left sided numbness 04/23/2015  . Stroke [facility]) 04/23/2015    Past Surgical History:  Procedure Laterality Date  . APPENDECTOMY    . CESAREAN SECTION    . CESAREAN SECTION    . EYE SURGERY      OB History    No data available       Home Medications    Prior to Admission medications   Medication Sig Start Date End Date Taking? Authorizing Provider  acetaminophen (TYLENOL) 500 MG tablet Take 1 tablet (500 mg total) by mouth every 6 (six) hours as needed for pain. 03/04/13   Yes Phelps, Vangie Bicker, PA-C  DM-Doxylamine-Acetaminophen (NYQUIL COLD & FLU PO) Take 30 mLs by mouth as needed.   Yes [provider]  azithromycin (ZITHROMAX Z-PAK) 250 MG tablet Take 1 tablet (250 mg total) by mouth daily for 6 days. Take 2 pills (500 mg) on day 1, 1 pill (250 mg) on day 2, 3, 4 and 5 11/15/17 11/21/17  Liberty Handy, PA-C  cyclobenzaprine (FLEXERIL) 5 MG tablet Take 1 tablet (5 mg total) by mouth 3 (three) times daily as needed for muscle spasms. Patient not taking: Reported on 06/01/2017 05/10/16   Menshew, Charlesetta Ivory, PA-C  HYDROcodone-acetaminophen (NORCO/VICODIN) 5-325 MG tablet Take 1 tablet by mouth every 4 (four) hours as needed. Patient not taking: Reported on 06/01/2017 02/19/16   Kem Boroughs B, FNP  ketorolac (TORADOL) 10 MG tablet Take 1 tablet (10 mg total) by mouth every 8 (eight) hours. Patient not taking: Reported on 06/01/2017 05/10/16   Menshew, Charlesetta Ivory, PA-C  naproxen (NAPROSYN) 500 MG tablet Take 1 tablet (500 mg total) by mouth 2 (two) times daily with a meal. Patient not taking: Reported on 06/01/2017 04/12/16   Doreene Nest, NP  Vitamin D, Ergocalciferol, (DRISDOL) 50000 units CAPS capsule Take 1 capsule by mouth once weekly for a total of 12 weeks. Patient not taking: Reported on 06/01/2017 04/12/16   Doreene Nest, NP    Family History Family History  Problem Relation Age of Onset  . Hyperlipidemia Mother   . Hyperlipidemia Father   . Hypertension Father   . Diabetes Father   . Hypertension Mother   . Hypertension Sister   . Hypertension Brother     Social History Social History   Tobacco Use  . Smoking status: Never Smoker  . Smokeless tobacco: Never Used  Substance Use Topics  . Alcohol use: Yes    Comment: social  . Drug use: No     Allergies   Shellfish allergy and Penicillins   Review of Systems Review of Systems  Constitutional: Positive for chills and fatigue.  HENT: Positive for congestion.     Respiratory: Positive for cough.   Cardiovascular: Positive for chest pain.  Musculoskeletal: Positive for back pain and myalgias.  All other systems reviewed and are negative.    Physical Exam Updated Vital Signs BP (!) 155/88   Pulse 78   Temp 99.4 F (37.4 C) (Oral)   Resp 17   Ht 5\' 4"  (1.626 m)   Wt 85.7 kg (189 lb)   SpO2 99%   BMI 32.44 kg/m   Physical Exam  Constitutional: She appears well-developed and well-nourished.  NAD. Non toxic.   HENT:  Head: Normocephalic and atraumatic.  Nose: Nose normal.  Moist mucous membranes. Tonsils and oropharynx normal  Eyes: Conjunctivae, EOM and lids are normal.  Neck: Trachea normal and normal range of motion.  Neck is supple Trachea midline No cervical adenopathy  Cardiovascular: Normal rate, regular rhythm, S1 normal, S2 normal and normal heart sounds.  Pulses:      Carotid pulses are 2+ on the right side, and 2+ on the left side.      Radial pulses are 2+ on the right side, and 2+ on the left side.       Dorsalis pedis pulses are 2+ on the right side, and 2+ on the left side.  RRR. No orthopnea. No LE edema or calf tenderness.   Pulmonary/Chest: Effort normal and breath sounds normal. No respiratory distress. She has no decreased breath sounds. She has no rhonchi. She exhibits tenderness.  Upper, sternal chest wall tenderness, also reproducible with AROM of upper extremities. No rales or wheezing.  Abdominal: Soft. Bowel sounds are normal. There is no tenderness.  No epigastric tenderness. No distention.   Musculoskeletal: She exhibits tenderness.  Left sided thoracic paraspinal and parascapular muscle tenderness, reproducible with AROM of upper extremities. No overlaying rash.   Neurological: She is alert. GCS eye subscore is 4. GCS verbal subscore is 5. GCS motor subscore is 6.  Skin: Skin is warm and dry. Capillary refill takes less than 2 seconds.  No rash to chest wall  Psychiatric: She has a normal mood and  affect. Her speech is normal and behavior is normal. Judgment and thought content normal. Cognition and memory are normal.     ED Treatments / Results  Labs (all labs ordered are listed, but only abnormal results are displayed) Labs Reviewed  BASIC METABOLIC PANEL - Abnormal; Notable for the following components:      Result Value   Calcium 8.8 (*)    All other components within normal limits  CBC - Abnormal; Notable for the following components:   Hemoglobin 11.0 (*)    HCT 34.8 (*)    Platelets 145 (*)    All other components within normal limits  I-STAT TROPONIN, ED  I-STAT BETA HCG BLOOD, ED ([facility], [facility], [facility] ONLY)  I-STAT TROPONIN, ED  EKG  EKG Interpretation  Date/Time:  Saturday November 15 2017 15:32:30 EST Ventricular Rate:  82 PR Interval:  156 QRS Duration: 84 QT Interval:  394 QTC Calculation: 460 R Axis:   48 Text Interpretation:  Normal sinus rhythm Low voltage QRS T wave abnormality, consider inferolateral ischemia Prolonged QT Abnormal ECG No significant change since last tracing Confirmed by Doug Sou [phone]) on 11/15/2017 6:42:31 PM       Radiology Dg Chest 2 View  Result Date: 11/15/2017 CLINICAL DATA:  Chest pain and cough EXAM: CHEST - 2 VIEW COMPARISON:  03/04/2013 FINDINGS: The heart size and mediastinal contours are within normal limits. Both lungs are clear. The visualized skeletal structures are unremarkable. IMPRESSION: No active cardiopulmonary disease. Electronically Signed   By: Alcide Clever M.D.   On: 11/15/2017 15:59    Procedures Procedures (including critical care time)  Medications Ordered in ED Medications - No data to display   Initial Impression / Assessment and Plan / ED Course  I have reviewed the triage vital signs and the nursing notes.  Pertinent labs & imaging results that were available during my care of the patient were reviewed by me and considered in my medical decision making (see chart for details).    47 year old  female is here with chest pain, it sounds atypical.  Chest pain is constant, worse with palpation, movement, cough and taking deep breaths. Cardiac risk factors include elevated BMI. However, she is also having infectious symptoms, with known sick contacts. She did recently fly to [facility] however based on her vital signs and previous medical history she is PERC negative. She has no LE edema or calf pain. on exam VS are wnl. Cardiovascular and pulmonary exam benign. CXR, EKG, troponin x 2 within normal limits.  CBC and BMP unremarkable.  PERC negative, and suspicion for PE is lower given infectious symptoms and known contacts. Heart score = 3. Patient has been ambulatory in ED. Given symptoms, reassuring ED work up, low risk HEART score patient will be discharged with recommendation to follow up with PCP and cardiologist in regards to today's hospital visit. ED return preacutions given. Pt appears reliable for follow up and is agreeable to discharge. Pt discussed with SP.   Final Clinical Impressions(s) / ED Diagnoses   Final diagnoses:  Cough  Atypical chest pain    ED Discharge Orders        Ordered    azithromycin (ZITHROMAX Z-PAK) 250 MG tablet  Daily     11/15/17 1928       Jerrell Mylar 11/15/17 1930    Doug Sou, MD 11/15/17 2358

## 2017-11-15 NOTE — ED Notes (Signed)
Patient states pain from her shoulder blade radiates to her chest, states pain started a few days before her cough.

## 2017-11-15 NOTE — Discharge Instructions (Signed)
Your lab work, chest x-ray, EKG and heart enzymes were normal today. Based on your symptoms, the cause of your symptoms is most likely an infectious process and possibly early pneumonia. We will treat you for a suspected pneumonia, although your chest x-ray was normal today sometimes small pneumonias are missed on x-rays. Take antibiotics as prescribed. Stay well-hydrated. Return to the emergency department if your chest pain becomes exertional or worse with activity, you develop shortness of breath, palpitations, dizziness. Follow up with a primary care doctor in the next 3 days if your symptoms are not improving.

## 2017-11-15 NOTE — ED Triage Notes (Signed)
Pt presents to ED for assessment of cough, congestion, chills and "feeling hot", headaches, and left shoulder blade pain, generalized body aches and fatigue/malaise.  Patient denies n/v/d.

## 2018-05-01 IMAGING — DX DG FOREARM 2V*R*
2 series · 2 of 2 positions shown · non-contrast
Comparison: None in PACs

CLINICAL DATA: Status post fall 3 months ago; persistent right
forearm pain.

EXAM:
RIGHT FOREARM - 2 VIEW

[forearm ap]
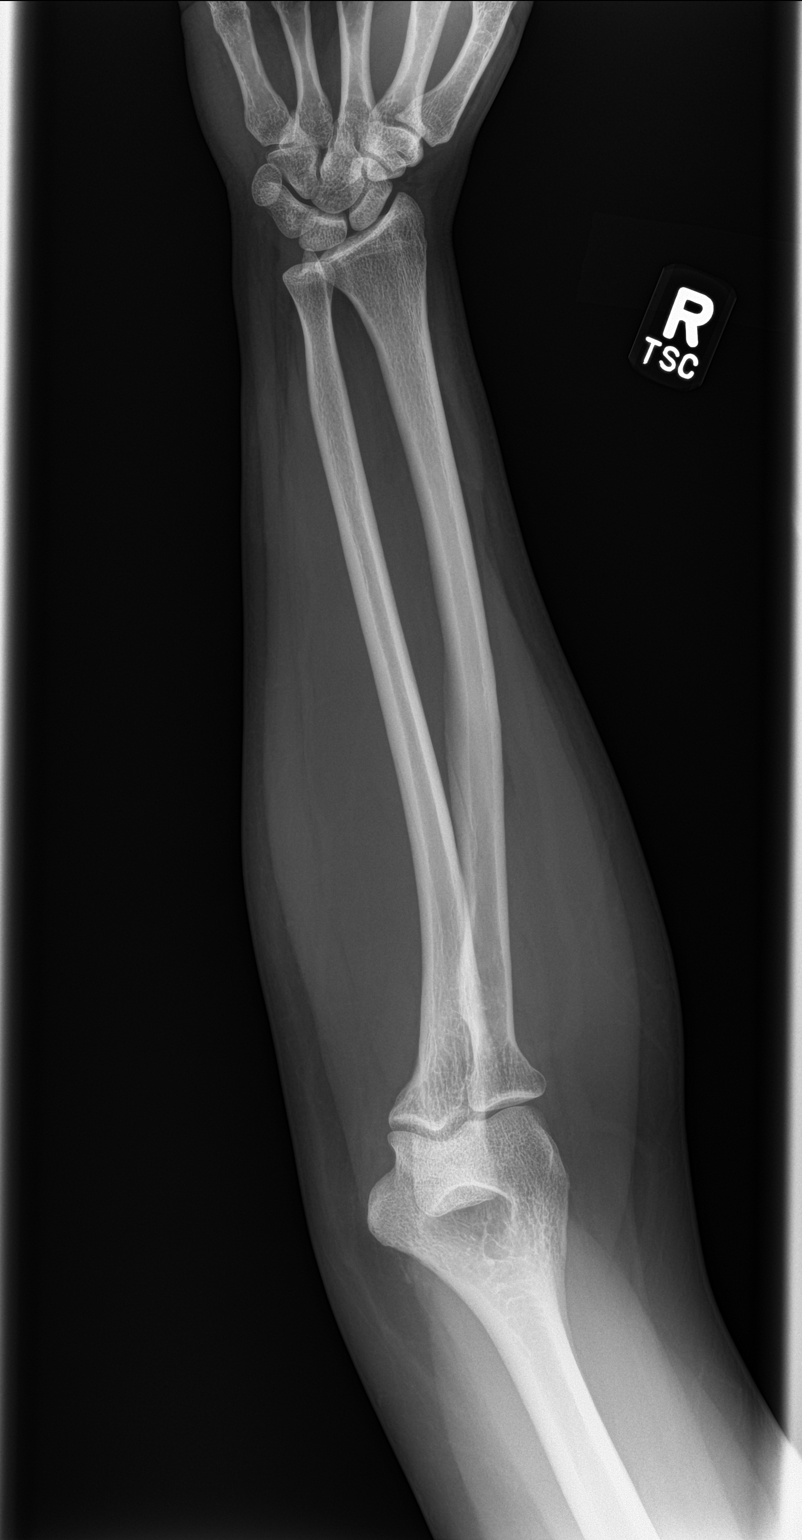

[forearm lat]
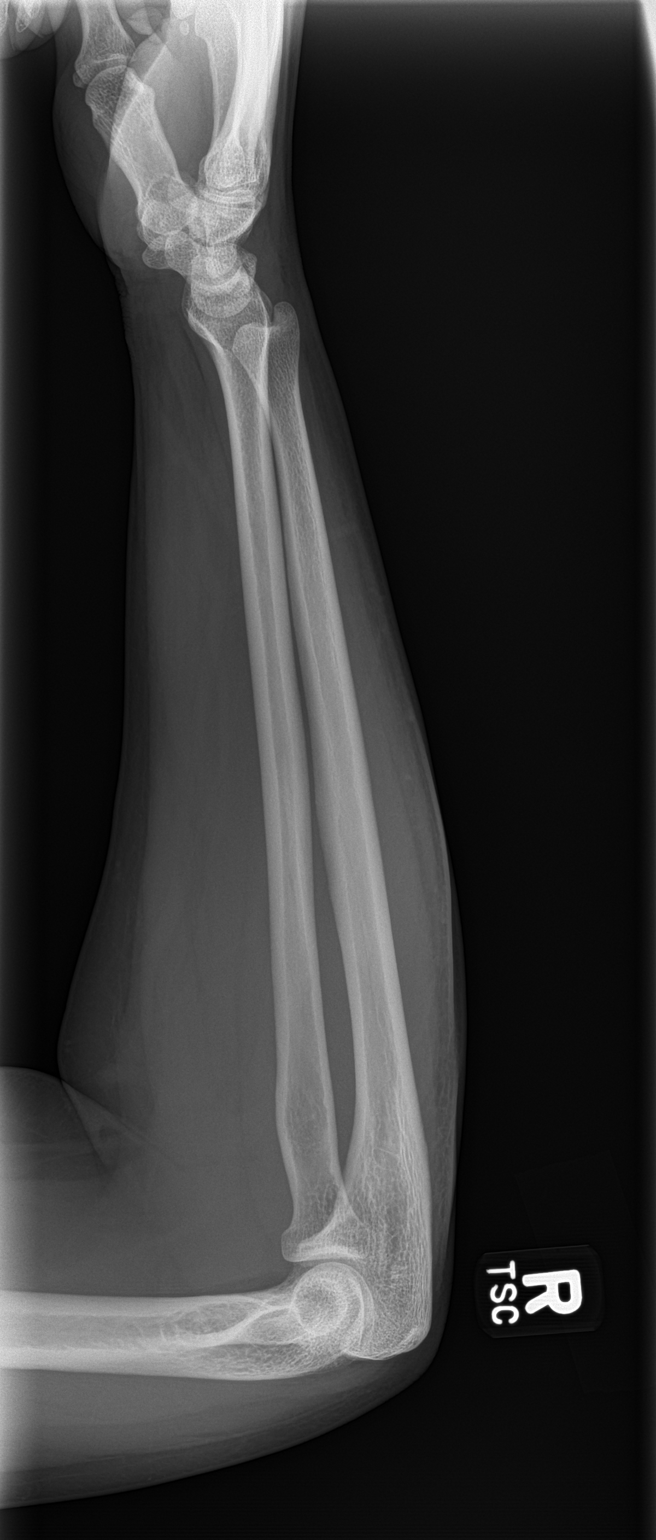

[2 of 2 positions shown; findings below may reference images not displayed]

FINDINGS: The shafts of the right radius and ulna are adequately mineralized.
There is no lytic or blastic lesion or periosteal reaction. The
observed portions of the right elbow and right wrist are normal. No
elbow effusion is demonstrated. The soft tissues of the forearm
exhibit no abnormal densities.
IMPRESSION: There is no acute or chronic bony abnormality of the right radius or
ulna.

## 2018-11-10 ENCOUNTER — Encounter: Payer: Self-pay | Admitting: Obstetrics & Gynecology

## 2018-11-10 DIAGNOSIS — N939 Abnormal uterine and vaginal bleeding, unspecified: Secondary | ICD-10-CM

## 2018-11-10 NOTE — Progress Notes (Deleted)
   Patient did not show up today for her scheduled appointment.   Antonea Gaut, MD, FACOG Obstetrician & Gynecologist, [facility], [facility]  

## 2019-09-23 LAB — HM PAP SMEAR: HM Pap smear: NEGATIVE

## 2019-09-23 LAB — RESULTS CONSOLE HPV: CHL HPV: NEGATIVE

## 2019-12-30 ENCOUNTER — Other Ambulatory Visit: Payer: Self-pay

## 2019-12-30 ENCOUNTER — Emergency Department
Admission: EM | Admit: 2019-12-30 | Discharge: 2019-12-30 | Disposition: A | Discharge status: Home / Self Care | Payer: BC Managed Care – PPO | Attending: Emergency Medicine | Admitting: Emergency Medicine

## 2019-12-30 DIAGNOSIS — J302 Other seasonal allergic rhinitis: Secondary | ICD-10-CM

## 2019-12-30 DIAGNOSIS — J45909 Unspecified asthma, uncomplicated: Secondary | ICD-10-CM | POA: Diagnosis present

## 2019-12-30 DIAGNOSIS — Z8673 Personal history of transient ischemic attack (TIA), and cerebral infarction without residual deficits: Secondary | ICD-10-CM | POA: Diagnosis not present

## 2019-12-30 DIAGNOSIS — J0140 Acute pansinusitis, unspecified: Secondary | ICD-10-CM

## 2019-12-30 MED ORDER — PREDNISONE 10 MG (21) PO TBPK: ORAL_TABLET | ORAL | 0 refills | Status: DC

## 2019-12-30 MED ORDER — PSEUDOEPH-BROMPHEN-DM 30-2-10 MG/5ML PO SYRP: 10.0000 mL | ORAL_SOLUTION | Freq: Four times a day (QID) | ORAL | 0 refills | Status: DC | PRN

## 2019-12-30 NOTE — ED Notes (Signed)
Pt states sinus congestion since Sunday after she was exposed to a lot of pollen, states no one else in the family has similar issues, denies exposure to covid.

## 2019-12-30 NOTE — ED Triage Notes (Signed)
Pt comes via POV from home with c/o sinus issue. Pt states they started bothering her on Sunday with the pollen. Pt states cough that is yellow and productive.

## 2019-12-30 NOTE — ED Provider Notes (Signed)
[facility] Emergency Department Provider Note  ____________________________________________  Time seen: Approximately 8:03 PM  I have reviewed the triage vital signs and the nursing notes.   HISTORY  Chief Complaint sinus   HPI Karina Neal is a 49 y.o. female presents to the emergency department for treatment and evaluation  of sinus pressure and drainage with productive cough.  Symptoms started 4 days ago after being exposed to a lot of pollen.  No relief with Mucinex and NyQuil.  No fever, nausea, vomiting, loss of sense of smell or taste.  No known exposure to COVID-19.   Past Medical History:  Diagnosis Date  . Asthma   . Blood pressure elevated   . Chicken pox   . Frequent headaches     Patient Active Problem List   Diagnosis Date Noted  . Preventative health care 04/10/2016  . Complicated migraine   . Left sided numbness 04/23/2015  . Stroke [facility]) 04/23/2015    Past Surgical History:  Procedure Laterality Date  . APPENDECTOMY    . CESAREAN SECTION    . CESAREAN SECTION    . EYE SURGERY      Prior to Admission medications   Medication Sig Start Date End Date Taking? Authorizing Provider  acetaminophen (TYLENOL) 500 MG tablet Take 1 tablet (500 mg total) by mouth every 6 (six) hours as needed for pain. 03/04/13   Lurene Shadow, PA-C  brompheniramine-pseudoephedrine-DM 30-2-10 MG/5ML syrup Take 10 mLs by mouth 4 (four) times daily as needed. 12/30/19   Keanu Lesniak, Rulon Eisenmenger B, FNP  cyclobenzaprine (FLEXERIL) 5 MG tablet Take 1 tablet (5 mg total) by mouth 3 (three) times daily as needed for muscle spasms. Patient not taking: Reported on 06/01/2017 05/10/16   Menshew, Charlesetta Ivory, PA-C  DM-Doxylamine-Acetaminophen (NYQUIL COLD & FLU PO) Take 30 mLs by mouth as needed.    [provider]  HYDROcodone-acetaminophen (NORCO/VICODIN) 5-325 MG tablet Take 1 tablet by mouth every 4 (four) hours as needed. Patient not taking: Reported on 06/01/2017  02/19/16   Kem Boroughs B, FNP  ketorolac (TORADOL) 10 MG tablet Take 1 tablet (10 mg total) by mouth every 8 (eight) hours. Patient not taking: Reported on 06/01/2017 05/10/16   Menshew, Charlesetta Ivory, PA-C  naproxen (NAPROSYN) 500 MG tablet Take 1 tablet (500 mg total) by mouth 2 (two) times daily with a meal. Patient not taking: Reported on 06/01/2017 04/12/16   Doreene Nest, NP  predniSONE (STERAPRED UNI-PAK 21 TAB) 10 MG (21) TBPK tablet Take 6 tablets on the first day and decrease by 1 tablet each day until finished. 12/30/19   Ronte Parker, Rulon Eisenmenger B, FNP  Vitamin D, Ergocalciferol, (DRISDOL) 50000 units CAPS capsule Take 1 capsule by mouth once weekly for a total of 12 weeks. Patient not taking: Reported on 06/01/2017 04/12/16   Doreene Nest, NP    Allergies Shellfish allergy and Penicillins  Family History  Problem Relation Age of Onset  . Hyperlipidemia Mother   . Hyperlipidemia Father   . Hypertension Father   . Diabetes Father   . Hypertension Mother   . Hypertension Sister   . Hypertension Brother     Social History Social History   Tobacco Use  . Smoking status: Never Smoker  . Smokeless tobacco: Never Used  Substance Use Topics  . Alcohol use: Yes    Comment: social  . Drug use: No    Review of Systems Constitutional: Negative fever/chills.  Normal appetite. ENT: Positive for sore  throat.  Positive for sinus pressure. Cardiovascular: Denies chest pain. Respiratory: Negative for shortness of breath.  Positive for cough.  Negative for wheezing.  Gastrointestinal: No nausea, no vomiting.  Negative for diarrhea.  Musculoskeletal: Negative for body aches Skin: Negative for rash. Neurological: Negative for headaches ____________________________________________   PHYSICAL EXAM:  VITAL SIGNS: ED Triage Vitals  Enc Vitals Group     BP 12/30/19 1832 (!) 147/91     Pulse Rate 12/30/19 1832 (!) 103     Resp 12/30/19 1832 19     Temp 12/30/19 1832 98.9 F (37.2  C)     Temp src --      SpO2 12/30/19 1832 100 %     Weight 12/30/19 1832 205 lb (93 kg)     Height 12/30/19 1832 5\' 4"  (1.626 m)     Head Circumference --      Peak Flow --      Pain Score 12/30/19 1835 0     Pain Loc --      Pain Edu? --      Excl. in [place]? --     Constitutional: Alert and oriented.  Overall well appearing and in no acute distress. Eyes: Conjunctivae are injected. Ears: Bilateral TM normal Nose: Pan sinus congestion noted; clear rhinnorhea. Mouth/Throat: Mucous membranes are moist.  Oropharynx clear with cobblestone exudates. Tonsils normal. Uvula midline. Neck: No stridor.  Lymphatic: No cervical lymphadenopathy. Cardiovascular: Normal rate, regular rhythm. Good peripheral circulation. Respiratory: Respirations are even and unlabored.  No retractions.  Breath sounds clear to auscultation. Gastrointestinal: Soft and nontender.  Musculoskeletal: FROM x 4 extremities.  Neurologic:  Normal speech and language. Skin:  Skin is warm, dry and intact. No rash noted. Psychiatric: Mood and affect are normal. Speech and behavior are normal.  ____________________________________________   LABS (all labs ordered are listed, but only abnormal results are displayed)  Labs Reviewed - No data to display ____________________________________________  EKG  Not indicated ____________________________________________  RADIOLOGY  Not indicated ____________________________________________   PROCEDURES  Procedure(s) performed: None  Critical Care performed: No ____________________________________________   INITIAL IMPRESSION / ASSESSMENT AND PLAN / ED COURSE  49 y.o. female presenting to the emergency department for treatment and evaluation after being exposed to significant amount of pollen 4 days ago.  See HPI for further details.  Exam shows significant sinus congestion.  No indication of infection.  Plan will be to treat her with prednisone and Bromfed.  She will  be given a work excuse for tomorrow as well.  She is to follow-up with her primary care provider if her symptoms are not improving over the next few days.  For symptoms of change or worsen or for new concerns if she is unable to see primary care, she is advised to return to the emergency department.    Medications - No data to display  ED Discharge Orders         Ordered    predniSONE (STERAPRED UNI-PAK 21 TAB) 10 MG (21) TBPK tablet     12/30/19 2007    brompheniramine-pseudoephedrine-DM 30-2-10 MG/5ML syrup  4 times daily PRN     12/30/19 2008           Pertinent labs & imaging results that were available during my care of the patient were reviewed by me and considered in my medical decision making (see chart for details).    If controlled substance prescribed during this visit, 12 month history viewed on the [facility] prior to issuing  an initial prescription for Schedule II or III opiod. ____________________________________________   FINAL CLINICAL IMPRESSION(S) / ED DIAGNOSES  Final diagnoses:  Acute non-recurrent pansinusitis  Seasonal allergies    Note:  This document was prepared using Dragon voice recognition software and may include unintentional dictation errors.    Chinita Pester, FNP 12/30/19 2009    Minna Antis, MD 12/30/19 2031

## 2019-12-30 NOTE — Discharge Instructions (Signed)
Please follow-up with your primary care provider especially if symptoms are not improving with medications.  Monitor your blood pressure closely while taking prednisone and this cough medication.  If you notice that your blood pressure is elevated, please stop taking the medications.  Return to the emergency department for symptoms that change or worsen if you are unable to schedule an appointment with primary care provider.

## 2020-04-26 DIAGNOSIS — Z09 Encounter for follow-up examination after completed treatment for conditions other than malignant neoplasm: Secondary | ICD-10-CM | POA: Diagnosis not present

## 2020-04-26 DIAGNOSIS — N939 Abnormal uterine and vaginal bleeding, unspecified: Secondary | ICD-10-CM | POA: Diagnosis not present

## 2020-07-10 ENCOUNTER — Other Ambulatory Visit: Payer: Self-pay

## 2020-07-10 ENCOUNTER — Ambulatory Visit
Admission: EM | Admit: 2020-07-10 | Discharge: 2020-07-10 | Disposition: A | Discharge status: Home / Self Care | Payer: BC Managed Care – PPO

## 2020-07-10 DIAGNOSIS — T22211A Burn of second degree of right forearm, initial encounter: Secondary | ICD-10-CM

## 2020-07-10 MED ORDER — SILVER SULFADIAZINE 1 % EX CREA: TOPICAL_CREAM | Freq: Once | CUTANEOUS | Status: AC

## 2020-07-10 MED ORDER — SILVER SULFADIAZINE 1 % EX CREA: 1.0000 "application " | TOPICAL_CREAM | Freq: Every day | CUTANEOUS | 0 refills | Status: DC

## 2020-07-10 NOTE — ED Triage Notes (Signed)
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 The area is blistered. Reports it keeps popping and blistering back up. The area is painful.

## 2020-07-10 NOTE — ED Provider Notes (Signed)
Karina Neal    CSN: [id number] Arrival date & time: 07/10/20  1538      History   Chief Complaint Chief Complaint  Patient presents with  . Burn    HPI Karina Neal is a 49 y.o. female.   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  The area is painful and blistered.  She denies numbness, weakness, paresthesias, fever, chills, or other symptoms.  No treatment attempted at home.  Her medical history includes asthma, frequent headaches, elevated blood pressure.  The history is provided by the patient and medical records.    Past Medical History:  Diagnosis Date  . Asthma   . Blood pressure elevated   . Chicken pox   . Frequent headaches     Patient Active Problem List   Diagnosis Date Noted  . Preventative health care 04/10/2016  . Complicated migraine   . Left sided numbness 04/23/2015  . Stroke [facility]) 04/23/2015    Past Surgical History:  Procedure Laterality Date  . APPENDECTOMY    . CESAREAN SECTION    . CESAREAN SECTION    . EYE SURGERY      OB History   No obstetric history on file.      Home Medications    Prior to Admission medications   Medication Sig Start Date End Date Taking? Authorizing Provider  medroxyPROGESTERone (PROVERA) 10 MG tablet Take 10 mg by mouth daily.   Yes [provider]  acetaminophen (TYLENOL) 500 MG tablet Take 1 tablet (500 mg total) by mouth every 6 (six) hours as needed for pain. 03/04/13   Lurene Shadow, PA-C  brompheniramine-pseudoephedrine-DM 30-2-10 MG/5ML syrup Take 10 mLs by mouth 4 (four) times daily as needed. 12/30/19   Triplett, Rulon Eisenmenger B, FNP  cyclobenzaprine (FLEXERIL) 5 MG tablet Take 1 tablet (5 mg total) by mouth 3 (three) times daily as needed for muscle spasms. Patient not taking: Reported on 06/01/2017 05/10/16   Menshew, Charlesetta Ivory, PA-C  DM-Doxylamine-Acetaminophen (NYQUIL COLD & FLU PO) Take 30 mLs by mouth as needed.    [provider]  HYDROcodone-acetaminophen (NORCO/VICODIN) 5-325 MG tablet Take 1 tablet by mouth every 4 (four) hours as needed. Patient not taking: Reported on 06/01/2017 02/19/16   Kem Boroughs B, FNP  ketorolac (TORADOL) 10 MG tablet Take 1 tablet (10 mg total) by mouth every 8 (eight) hours. Patient not taking: Reported on 06/01/2017 05/10/16   Menshew, Charlesetta Ivory, PA-C  naproxen (NAPROSYN) 500 MG tablet Take 1 tablet (500 mg total) by mouth 2 (two) times daily with a meal. Patient not taking: Reported on 06/01/2017 04/12/16   Doreene Nest, NP  predniSONE (STERAPRED UNI-PAK 21 TAB) 10 MG (21) TBPK tablet Take 6 tablets on the first day and decrease by 1 tablet each day until finished. 12/30/19   Triplett, Rulon Eisenmenger B, FNP  silver sulfADIAZINE (SILVADENE) 1 % cream Apply 1 application topically daily. 07/10/20   Mickie Bail, NP  Vitamin D, Ergocalciferol, (DRISDOL) 50000 units CAPS capsule Take 1 capsule by mouth once weekly for a total of 12 weeks. Patient not taking: Reported on 06/01/2017 04/12/16   Doreene Nest, NP    Family History Family History  Problem Relation Age of Onset  . Hyperlipidemia Mother   . Hypertension Mother   . Hyperlipidemia Father   . Hypertension Father   . Diabetes Father   . Hypertension Sister   .  Hypertension Brother     Social History Social History   Tobacco Use  . Smoking status: Never Smoker  . Smokeless tobacco: Never Used  Substance Use Topics  . Alcohol use: Yes    Comment: social  . Drug use: No     Allergies   Shellfish allergy and Penicillins   Review of Systems Review of Systems  Constitutional: Negative for chills and fever.  HENT: Negative for ear pain and sore throat.   Eyes: Negative for pain and visual disturbance.  Respiratory: Negative for cough and shortness of breath.   Cardiovascular: Negative for chest pain and palpitations.  Gastrointestinal: Negative for abdominal pain and vomiting.  Genitourinary: Negative  for dysuria and hematuria.  Musculoskeletal: Negative for arthralgias and back pain.  Skin: Positive for wound. Negative for color change.  Neurological: Negative for seizures, syncope, weakness and numbness.  All other systems reviewed and are negative.    Physical Exam Triage Vital Signs ED Triage Vitals  Enc Vitals Group     BP      Pulse      Resp      Temp      Temp src      SpO2      Weight      Height      Head Circumference      Peak Flow      Pain Score      Pain Loc      Pain Edu?      Excl. in [place]?    No data found.  Updated Vital Signs BP 135/84   Pulse 92   Temp 98.8 F (37.1 C)   Resp 18   LMP 07/10/2020   SpO2 95%   Visual Acuity Right Eye Distance:   Left Eye Distance:   Bilateral Distance:    Right Eye Near:   Left Eye Near:    Bilateral Near:     Physical Exam Vitals and nursing note reviewed.  Constitutional:      General: She is not in acute distress.    Appearance: She is well-developed.  HENT:     Head: Normocephalic and atraumatic.     Mouth/Throat:     Mouth: Mucous membranes are moist.  Eyes:     Conjunctiva/sclera: Conjunctivae normal.  Cardiovascular:     Rate and Rhythm: Normal rate and regular rhythm.     Heart sounds: No murmur heard.   Pulmonary:     Effort: Pulmonary effort is normal. No respiratory distress.     Breath sounds: Normal breath sounds.  Abdominal:     Palpations: Abdomen is soft.     Tenderness: There is no abdominal tenderness.  Musculoskeletal:        General: No deformity. Normal range of motion.     Cervical back: Neck supple.  Skin:    General: Skin is warm and dry.     Capillary Refill: Capillary refill takes less than 2 seconds.     Comments: Blistered burn to right forearm.   Neurological:     General: No focal deficit present.     Mental Status: She is alert and oriented to person, place, and time.     Sensory: No sensory deficit.     Motor: No weakness.     Gait: Gait normal.   Psychiatric:        Mood and Affect: Mood normal.        Behavior: Behavior normal.  UC Treatments / Results  Labs (all labs ordered are listed, but only abnormal results are displayed) Labs Reviewed - No data to display  EKG   Radiology No results found.  Procedures Procedures (including critical care time)  Medications Ordered in UC Medications  silver sulfADIAZINE (SILVADENE) 1 % cream ( Topical Given 07/10/20 1608)    Initial Impression / Assessment and Plan / UC Course  I have reviewed the triage vital signs and the nursing notes.  Pertinent labs & imaging results that were available during my care of the patient were reviewed by me and considered in my medical decision making (see chart for details).   Partial-thickness burn of right forearm.  Treating with Silvadene cream.  Discussed wound care and signs of infection.  Instructed patient to follow-up with her PCP or return here right away if she notes signs of infection.  Patient agrees to plan of care.   Final Clinical Impressions(s) / UC Diagnoses   Final diagnoses:  Partial thickness burn of right forearm, initial encounter     Discharge Instructions     Apply the Silvadene cream as directed.    Follow-up with your primary care provider or return here right away if you note signs of infection such as redness, pus-like drainage, fever, or other concerns.        ED Prescriptions    Medication Sig Dispense Auth. Provider   silver sulfADIAZINE (SILVADENE) 1 % cream Apply 1 application topically daily. 50 g Mickie Bail, NP     PDMP not reviewed this encounter.   Mickie Bail, NP 07/10/20 1610

## 2020-07-10 NOTE — Discharge Instructions (Addendum)
Apply the Silvadene cream as directed.    Follow-up with your primary care provider or return here right away if you note signs of infection such as redness, pus-like drainage, fever, or other concerns.

## 2020-07-31 ENCOUNTER — Ambulatory Visit: Payer: Self-pay

## 2020-10-30 ENCOUNTER — Other Ambulatory Visit: Payer: Self-pay

## 2020-10-30 ENCOUNTER — Ambulatory Visit
Admission: EM | Admit: 2020-10-30 | Discharge: 2020-10-30 | Disposition: A | Discharge status: Home / Self Care | Payer: BC Managed Care – PPO

## 2020-10-30 DIAGNOSIS — M7989 Other specified soft tissue disorders: Secondary | ICD-10-CM | POA: Diagnosis not present

## 2020-10-30 DIAGNOSIS — W4904XA Ring or other jewelry causing external constriction, initial encounter: Secondary | ICD-10-CM

## 2020-10-30 DIAGNOSIS — S60449A External constriction of unspecified finger, initial encounter: Secondary | ICD-10-CM

## 2020-10-30 NOTE — Discharge Instructions (Addendum)
Finger swelling.  Ring unable to be removed.  Apply ice to finger for 10-15 minutes 4 times a day and elevate finger for the next few days.   Attempt to remove finger using lotion/lube or string method.   If swelling worsens, you are unable to move your finger, or you lose sensation in that finger- come back and see [place].

## 2020-10-30 NOTE — ED Provider Notes (Signed)
Karina Neal    CSN: [id number] Arrival date & time: 10/30/20  1341      History   Chief Complaint Chief Complaint  Patient presents with  . finger swelling, ring entrapment    HPI Karina Neal is a 50 y.o. female.   Patient 50 year old here with left ring finger swelling and is unable to remove ring.  Patient states that she believes the swelling is getting gradually worse over the past few days, but is able to move her finger.  Patient denies any injury to finger.  Denies redness, warmth, swelling.  No fevers.   The history is provided by the patient.    Past Medical History:  Diagnosis Date  . Asthma   . Blood pressure elevated   . Chicken pox   . Frequent headaches     Patient Active Problem List   Diagnosis Date Noted  . Preventative health care 04/10/2016  . Complicated migraine   . Left sided numbness 04/23/2015  . Stroke [facility]) 04/23/2015    Past Surgical History:  Procedure Laterality Date  . APPENDECTOMY    . CESAREAN SECTION    . CESAREAN SECTION    . EYE SURGERY      OB History   No obstetric history on file.      Home Medications    Prior to Admission medications   Medication Sig Start Date End Date Taking? Authorizing Provider  acetaminophen (TYLENOL) 500 MG tablet Take 1 tablet (500 mg total) by mouth every 6 (six) hours as needed for pain. 03/04/13   Lurene Shadow, PA-C  brompheniramine-pseudoephedrine-DM 30-2-10 MG/5ML syrup Take 10 mLs by mouth 4 (four) times daily as needed. 12/30/19   Triplett, Rulon Eisenmenger B, FNP  cyclobenzaprine (FLEXERIL) 5 MG tablet Take 1 tablet (5 mg total) by mouth 3 (three) times daily as needed for muscle spasms. Patient not taking: Reported on 06/01/2017 05/10/16   Menshew, Charlesetta Ivory, PA-C  DM-Doxylamine-Acetaminophen (NYQUIL COLD & FLU PO) Take 30 mLs by mouth as needed.    [provider]  HYDROcodone-acetaminophen (NORCO/VICODIN) 5-325 MG tablet Take 1 tablet by mouth every 4 (four) hours as  needed. Patient not taking: Reported on 06/01/2017 02/19/16   Kem Boroughs B, FNP  ketorolac (TORADOL) 10 MG tablet Take 1 tablet (10 mg total) by mouth every 8 (eight) hours. Patient not taking: Reported on 06/01/2017 05/10/16   Menshew, Charlesetta Ivory, PA-C  medroxyPROGESTERone (PROVERA) 10 MG tablet Take 10 mg by mouth daily.    [provider]  naproxen (NAPROSYN) 500 MG tablet Take 1 tablet (500 mg total) by mouth 2 (two) times daily with a meal. Patient not taking: Reported on 06/01/2017 04/12/16   Doreene Nest, NP  predniSONE (STERAPRED UNI-PAK 21 TAB) 10 MG (21) TBPK tablet Take 6 tablets on the first day and decrease by 1 tablet each day until finished. 12/30/19   Triplett, Rulon Eisenmenger B, FNP  silver sulfADIAZINE (SILVADENE) 1 % cream Apply 1 application topically daily. 07/10/20   Mickie Bail, NP  Vitamin D, Ergocalciferol, (DRISDOL) 50000 units CAPS capsule Take 1 capsule by mouth once weekly for a total of 12 weeks. Patient not taking: Reported on 06/01/2017 04/12/16   Doreene Nest, NP    Family History Family History  Problem Relation Age of Onset  . Hyperlipidemia Mother   . Hypertension Mother   . Hyperlipidemia Father   . Hypertension Father   . Diabetes Father   . Hypertension  Sister   . Hypertension Brother     Social History Social History   Tobacco Use  . Smoking status: Never Smoker  . Smokeless tobacco: Never Used  Substance Use Topics  . Alcohol use: Yes    Comment: social  . Drug use: No     Allergies   Shellfish allergy and Penicillins   Review of Systems Review of Systems  Constitutional: Negative for fever.  Musculoskeletal: Positive for joint swelling and myalgias. Negative for arthralgias.     Physical Exam Triage Vital Signs ED Triage Vitals  Enc Vitals Group     BP 10/30/20 1420 132/87     Pulse Rate 10/30/20 1420 83     Resp 10/30/20 1420 16     Temp 10/30/20 1420 99 F (37.2 C)     Temp Source 10/30/20 1420 Oral     SpO2  10/30/20 1420 95 %     Weight --      Height --      Head Circumference --      Peak Flow --      Pain Score 10/30/20 1436 8     Pain Loc --      Pain Edu? --      Excl. in [place]? --    No data found.  Updated Vital Signs BP 132/87 (BP Location: Right Arm)   Pulse 83   Temp 99 F (37.2 C) (Oral)   Resp 16   SpO2 95%   Visual Acuity Right Eye Distance:   Left Eye Distance:   Bilateral Distance:    Right Eye Near:   Left Eye Near:    Bilateral Near:     Physical Exam Vitals and nursing note reviewed.  Constitutional:      General: She is not in acute distress.    Appearance: She is not ill-appearing or diaphoretic.  HENT:     Head: Normocephalic and atraumatic.  Eyes:     Conjunctiva/sclera: Conjunctivae normal.  Cardiovascular:     Pulses: Normal pulses.  Pulmonary:     Effort: Pulmonary effort is normal.  Musculoskeletal:        General: Normal range of motion.     Right hand: Normal.     Left hand: Swelling present. No deformity, lacerations or tenderness. Normal range of motion. Normal capillary refill. Normal pulse.     Cervical back: Normal range of motion.  Skin:    General: Skin is warm and dry.  Neurological:     Mental Status: She is alert.  Psychiatric:        Mood and Affect: Mood normal.      UC Treatments / Results  Labs (all labs ordered are listed, but only abnormal results are displayed) Labs Reviewed - No data to display  EKG   Radiology No results found.  Procedures Procedures (including critical care time)  Medications Ordered in UC Medications - No data to display  Initial Impression / Assessment and Plan / UC Course  I have reviewed the triage vital signs and the nursing notes.  Pertinent labs & imaging results that were available during my care of the patient were reviewed by me and considered in my medical decision making (see chart for details).     Finger Swelling.  Constrictive jewelery of finger.  Assessment  with no red flags or concerns.  Left ring finger with no deficits and good circulation.   Attempted to remove ring with no success.  Try ice and elevation  for a few days to reduce swelling at home.  If unsuccessful, will return to have rings cut off.   Final Clinical Impressions(s) / UC Diagnoses   Final diagnoses:  Finger swelling  Constrictive jewelry of finger, initial encounter     Discharge Instructions     Finger swelling.  Ring unable to be removed.  Apply ice to finger for 10-15 minutes 4 times a day and elevate finger for the next few days.   Attempt to remove finger using lotion/lube or string method.   If swelling worsens, you are unable to move your finger, or you lose sensation in that finger- come back and see [place].     ED Prescriptions    None     PDMP not reviewed this encounter.   Ivette Loyal, NP 10/30/20 [phone]

## 2020-10-30 NOTE — ED Triage Notes (Signed)
Pt c/o swelling, pain to left ring finger and inability to remove rings from finger for past week.  Denies numbness to distal portion. Ice applied in prep for ring removal.

## 2020-10-31 ENCOUNTER — Ambulatory Visit (INDEPENDENT_AMBULATORY_CARE_PROVIDER_SITE_OTHER): Payer: BC Managed Care – PPO | Admitting: Family Medicine

## 2020-10-31 ENCOUNTER — Other Ambulatory Visit: Payer: Self-pay

## 2020-10-31 ENCOUNTER — Encounter: Payer: Self-pay | Admitting: Family Medicine

## 2020-10-31 VITALS — BP 120/80 | HR 86 | Temp 97.7°F | Ht 64.0 in | Wt 227.3 lb

## 2020-10-31 DIAGNOSIS — Z23 Encounter for immunization: Secondary | ICD-10-CM

## 2020-10-31 DIAGNOSIS — W4904XA Ring or other jewelry causing external constriction, initial encounter: Secondary | ICD-10-CM | POA: Diagnosis not present

## 2020-10-31 DIAGNOSIS — M7989 Other specified soft tissue disorders: Secondary | ICD-10-CM

## 2020-10-31 DIAGNOSIS — S60449A External constriction of unspecified finger, initial encounter: Secondary | ICD-10-CM | POA: Insufficient documentation

## 2020-10-31 HISTORY — DX: External constriction of unspecified finger, initial encounter: S60.449A

## 2020-10-31 NOTE — Progress Notes (Signed)
Patient ID: Karina Neal, female    DOB: 1970/12/07, 50 y.o.   MRN: [id number]  This visit was conducted in person.  BP 120/80   Pulse 86   Temp 97.7 F (36.5 C) (Temporal)   Ht 5\' 4"  (1.626 m)   Wt 227 lb 5 oz (103.1 kg)   SpO2 95%   BMI 39.02 kg/m    CC: check stuck ring Subjective:   HPI: Karina Neal is a 50 y.o. female presenting on 10/31/2020 for Edema (C/o left 4th finger swelling, worsening.  Noticed about 1 wk ago.  Cannot get ring off. )   Wedding band stuck on left ring finger for the past week. Seen at [facility] yesterday, band was unable to be removed. Recommended ice QID and finger elevation for a few days and then recommended re-attempt.  She's tried vaseline and baby oil without success.   Never had anything like this before.  Sometimes feet swell - long 12 hour shifts at [employer].   She wants to safe ring if possible.      Relevant past medical, surgical, family and social history reviewed and updated as indicated. Interim medical history since our last visit reviewed. Allergies and medications reviewed and updated. Outpatient Medications Prior to Visit  Medication Sig Dispense Refill  . acetaminophen (TYLENOL) 500 MG tablet Take 1 tablet (500 mg total) by mouth every 6 (six) hours as needed for pain. 30 tablet 0  . brompheniramine-pseudoephedrine-DM 30-2-10 MG/5ML syrup Take 10 mLs by mouth 4 (four) times daily as needed. 120 mL 0  . cyclobenzaprine (FLEXERIL) 5 MG tablet Take 1 tablet (5 mg total) by mouth 3 (three) times daily as needed for muscle spasms. (Patient not taking: Reported on 06/01/2017) 15 tablet 0  . DM-Doxylamine-Acetaminophen (NYQUIL COLD & FLU PO) Take 30 mLs by mouth as needed.    06/03/2017 HYDROcodone-acetaminophen (NORCO/VICODIN) 5-325 MG tablet Take 1 tablet by mouth every 4 (four) hours as needed. (Patient not taking: Reported on 06/01/2017) 12 tablet 0  . ketorolac (TORADOL) 10 MG tablet Take 1 tablet (10 mg total) by mouth every 8 (eight) hours.  (Patient not taking: Reported on 06/01/2017) 15 tablet 0  . medroxyPROGESTERone (PROVERA) 10 MG tablet Take 10 mg by mouth daily.    . naproxen (NAPROSYN) 500 MG tablet Take 1 tablet (500 mg total) by mouth 2 (two) times daily with a meal. (Patient not taking: Reported on 06/01/2017) 30 tablet 0  . predniSONE (STERAPRED UNI-PAK 21 TAB) 10 MG (21) TBPK tablet Take 6 tablets on the first day and decrease by 1 tablet each day until finished. 21 tablet 0  . silver sulfADIAZINE (SILVADENE) 1 % cream Apply 1 application topically daily. 50 g 0  . Vitamin D, Ergocalciferol, (DRISDOL) 50000 units CAPS capsule Take 1 capsule by mouth once weekly for a total of 12 weeks. (Patient not taking: Reported on 06/01/2017) 12 capsule 0   No facility-administered medications prior to visit.     Per HPI unless specifically indicated in ROS section below Review of Systems Objective:  BP 120/80   Pulse 86   Temp 97.7 F (36.5 C) (Temporal)   Ht 5\' 4"  (1.626 m)   Wt 227 lb 5 oz (103.1 kg)   SpO2 95%   BMI 39.02 kg/m   Wt Readings from Last 3 Encounters:  10/31/20 227 lb 5 oz (103.1 kg)  12/30/19 205 lb (93 kg)  11/15/17 189 lb (85.7 kg)      Physical Exam  Vitals and nursing note reviewed.  Constitutional:      Appearance: Normal appearance. She is not ill-appearing.  Musculoskeletal:        General: Swelling present.     Comments:  L ring finger swollen from MCP to PIP with 2 separate rings stuck due to swelling  FROM at finger  Skin:    General: Skin is warm and dry.  Neurological:     Mental Status: She is alert.     Comments: Neurovascularly intact, sensation intact    Using floss ring removal attempted unsuccessfully.    Assessment & Plan:  This visit occurred during the SARS-CoV-2 public health emergency.  Safety protocols were in place, including screening questions prior to the visit, additional usage of staff PPE, and extensive cleaning of exam room while observing appropriate contact  time as indicated for disinfecting solutions.   Problem List Items Addressed This Visit    Constrictive jewelry of finger - Primary    Unable to remove in office today using floss method.  Pt desires to continue attempting swelling reduction measures at home - ice, finger elevation - and will return in 2-3 days for re-attempt. Discussed if unsuccessful will likely need to have ring cut off. She agrees. Red flags to seek urgent care reviewed.        Other Visit Diagnoses    Need for influenza vaccination       Relevant Orders   Flu Vaccine QUAD 36+ mos IM (Completed)   Swelling of left ring finger           No orders of the defined types were placed in this encounter.  Orders Placed This Encounter  Procedures  . Flu Vaccine QUAD 36+ mos IM    Follow up plan: No follow-ups on file.  Karina Boyden, MD

## 2020-10-31 NOTE — Assessment & Plan Note (Signed)
Unable to remove in office today using floss method.  Pt desires to continue attempting swelling reduction measures at home - ice, finger elevation - and will return in 2-3 days for re-attempt. Discussed if unsuccessful will likely need to have ring cut off. She agrees. Red flags to seek urgent care reviewed.

## 2020-10-31 NOTE — Patient Instructions (Addendum)
Flu shot today  We were unable to remove ring today.  Continue ice and elevation at home for next 1-2 days, then return for reattempt.  If unable to remove then, we may need to cut off ring.

## 2020-11-01 ENCOUNTER — Telehealth: Payer: Self-pay | Admitting: Primary Care

## 2020-11-01 ENCOUNTER — Ambulatory Visit
Admission: EM | Admit: 2020-11-01 | Discharge: 2020-11-01 | Disposition: A | Discharge status: Home / Self Care | Payer: BC Managed Care – PPO

## 2020-11-01 ENCOUNTER — Other Ambulatory Visit: Payer: Self-pay

## 2020-11-01 DIAGNOSIS — M7989 Other specified soft tissue disorders: Secondary | ICD-10-CM | POA: Diagnosis not present

## 2020-11-01 DIAGNOSIS — W4904XD Ring or other jewelry causing external constriction, subsequent encounter: Secondary | ICD-10-CM

## 2020-11-01 DIAGNOSIS — S60449D External constriction of unspecified finger, subsequent encounter: Secondary | ICD-10-CM

## 2020-11-01 NOTE — ED Provider Notes (Signed)
[name]    CSN: [id number] Arrival date & time: 11/01/20  1049      History   Chief Complaint Chief Complaint  Patient presents with  . jewelry entrapment/constriction    HPI Karina Neal is a 50 y.o. female.   Karina Neal is 50 year old with left ring finger swelling and ring entrapment.  Patient states that swelling has gotten worse over the past 2 days.  Patient denies any pain or difficulty bending finger.  Denies any injury.    The history is provided by the patient.    Past Medical History:  Diagnosis Date  . Asthma   . Blood pressure elevated   . Chicken pox   . Frequent headaches     Patient Active Problem List   Diagnosis Date Noted  . Constrictive jewelry of finger 10/31/2020  . Preventative health care 04/10/2016  . Complicated migraine   . Left sided numbness 04/23/2015  . Stroke [facility]) 04/23/2015    Past Surgical History:  Procedure Laterality Date  . APPENDECTOMY    . CESAREAN SECTION    . CESAREAN SECTION    . EYE SURGERY      OB History   No obstetric history on file.      Home Medications    Prior to Admission medications   Not on File    Family History Family History  Problem Relation Age of Onset  . Hyperlipidemia Mother   . Hypertension Mother   . Hyperlipidemia Father   . Hypertension Father   . Diabetes Father   . Hypertension Sister   . Hypertension Brother     Social History Social History   Tobacco Use  . Smoking status: Never Smoker  . Smokeless tobacco: Never Used  Substance Use Topics  . Alcohol use: Yes    Comment: social  . Drug use: No     Allergies   Shellfish allergy and Penicillins   Review of Systems Review of Systems  Constitutional: Negative for fever.  Musculoskeletal: Positive for joint swelling.  Skin: Negative for color change, pallor, rash and wound.     Physical Exam Triage Vital Signs ED Triage Vitals  Enc Vitals Group     BP      Pulse      Resp      Temp       Temp src      SpO2      Weight      Height      Head Circumference      Peak Flow      Pain Score      Pain Loc      Pain Edu?      Excl. in GC?    No data found.  Updated Vital Signs BP 136/85 (BP Location: Right Arm)   Pulse 74   Temp 98.2 F (36.8 C) (Oral)   Resp 20   SpO2 95%   Visual Acuity Right Eye Distance:   Left Eye Distance:   Bilateral Distance:    Right Eye Near:   Left Eye Near:    Bilateral Near:     Physical Exam Vitals and nursing note reviewed.  Constitutional:      General: She is not in acute distress.    Appearance: She is not ill-appearing or diaphoretic.  HENT:     Head: Normocephalic and atraumatic.  Eyes:     Conjunctiva/sclera: Conjunctivae normal.  Cardiovascular:     Pulses: Normal  pulses.  Pulmonary:     Effort: Pulmonary effort is normal.  Musculoskeletal:     Left hand: Swelling present. Normal range of motion. Normal strength. Normal sensation. Normal capillary refill. Normal pulse.     Comments: Swelling and ring entrapment to left ring finger  Skin:    General: Skin is warm and dry.  Neurological:     Mental Status: She is alert.  Psychiatric:        Mood and Affect: Mood normal.      UC Treatments / Results  Labs (all labs ordered are listed, but only abnormal results are displayed) Labs Reviewed - No data to display  EKG   Radiology No results found.  Procedures Procedures (including critical care time)  Medications Ordered in UC Medications - No data to display  Initial Impression / Assessment and Plan / UC Course  I have reviewed the triage vital signs and the nursing notes.  Pertinent labs & imaging results that were available during my care of the patient were reviewed by me and considered in my medical decision making (see chart for details).     Finger Swelling Constrictive jewelry of left ring finger.   Ring removed using electronic ring cutter.  Patient tolerated procedure with no  problems or concerns.  Assessment with no red flags or concerns.   Cap refill <2 seconds, pulses intact, and skin color appropriate for ethnicity.   Apply ice as needed for swelling.    Final Clinical Impressions(s) / UC Diagnoses   Final diagnoses:  Finger swelling  Constrictive jewelry of finger, subsequent encounter     Discharge Instructions     We cut your rings off.    Apply ice to finger to help with swelling.    Return or go to the Emergency Department if symptoms worsen or do not improve in the next few days.       ED Prescriptions    None     PDMP not reviewed this encounter.   Ivette Loyal, NP 11/01/20 1112

## 2020-11-01 NOTE — ED Triage Notes (Signed)
Pt here for ring removal from left ring finger. Pt c/o mild tingling to finger.  Brisk cap refill, fingers warm Ring removed by provider and pt left with all jewelry.

## 2020-11-01 NOTE — Telephone Encounter (Signed)
Patient rings were cut off at [facility] today. Patient has not seen me since 2017.   I am happy to see patient for follow up and re-establish care.  Can do CPE if needed.

## 2020-11-01 NOTE — Discharge Instructions (Addendum)
We cut your rings off.    Apply ice to finger to help with swelling.    Return or go to the Emergency Department if symptoms worsen or do not improve in the next few days.

## 2020-11-01 NOTE — Telephone Encounter (Signed)
Patient came in to see Dr Reece Agar about her wedding band being stuck. After attempt from the provider he was unable to do it. I spoke with Riverton Sink in the office and she believes there is a tool in our office that can help take it off. I have her on the schedule for 11:20  Tomorrow. Please advise If you still want to see her or if she needs to seek treatment else where. She did go to an urgent care and they couldn't do it either. EM

## 2020-11-02 ENCOUNTER — Ambulatory Visit: Payer: BC Managed Care – PPO | Admitting: Primary Care

## 2020-11-03 NOTE — Telephone Encounter (Signed)
Viewed patient schedule and pt on schedule for April.

## 2021-01-02 ENCOUNTER — Telehealth (INDEPENDENT_AMBULATORY_CARE_PROVIDER_SITE_OTHER): Payer: BC Managed Care – PPO | Admitting: Primary Care

## 2021-01-02 ENCOUNTER — Encounter: Payer: Self-pay | Admitting: Primary Care

## 2021-01-02 ENCOUNTER — Other Ambulatory Visit: Payer: Self-pay

## 2021-01-02 ENCOUNTER — Other Ambulatory Visit: Payer: BC Managed Care – PPO

## 2021-01-02 VITALS — BP 155/103 | HR 78 | Temp 97.4°F | Ht 64.0 in | Wt 219.0 lb

## 2021-01-02 DIAGNOSIS — K219 Gastro-esophageal reflux disease without esophagitis: Secondary | ICD-10-CM | POA: Insufficient documentation

## 2021-01-02 DIAGNOSIS — R0981 Nasal congestion: Secondary | ICD-10-CM | POA: Diagnosis not present

## 2021-01-02 DIAGNOSIS — R051 Acute cough: Secondary | ICD-10-CM | POA: Insufficient documentation

## 2021-01-02 DIAGNOSIS — R059 Cough, unspecified: Secondary | ICD-10-CM

## 2021-01-02 MED ORDER — BENZONATATE 200 MG PO CAPS: 200.0000 mg | ORAL_CAPSULE | Freq: Three times a day (TID) | ORAL | 0 refills | Status: DC | PRN

## 2021-01-02 MED ORDER — FLUTICASONE PROPIONATE 50 MCG/ACT NA SUSP: 1.0000 | Freq: Two times a day (BID) | NASAL | 0 refills | Status: DC

## 2021-01-02 NOTE — Progress Notes (Signed)
Patient ID: Karina Neal, female    DOB: [birth year], 50 y.o.   MRN: [id number]  Virtual visit completed through [employer], a video enabled telemedicine application. Due to national recommendations of social distancing due to COVID-19, a virtual visit is felt to be most appropriate for this patient at this time. Reviewed limitations, risks, security and privacy concerns of performing a virtual visit and the availability of in person appointments. I also reviewed that there may be a patient responsible charge related to this service. The patient agreed to proceed.   Patient location: home Provider location: [place] at [facility], office Persons participating in this virtual visit: patient, provider   If any vitals were documented, they were collected by patient at home unless specified below.    BP (!) 155/103   Pulse 78   Temp (!) 97.4 F (36.3 C) (Temporal)   Ht 5\' 4"  (1.626 m)   Wt 219 lb (99.3 kg)   BMI 37.59 kg/m    CC: Cough/sinus pressure Subjective:   HPI: Karina Neal is a 50 y.o. female presenting on 01/02/2021 for cough and sinus pressure.  Karina Neal is a 50 year old female with a history of CVA and migraines who presents today with a chief complaint of cough.   She also repots sneezing, sinus pressure. Symptoms began about 3 days ago. She's been taking Zyrtec, Dayquil, Nyquil without much improvement. She denies fevers, nausea, diarrhea, sick contacts  She's not had the Covid-19 vaccines.       Relevant past medical, surgical, family and social history reviewed and updated as indicated. Interim medical history since our last visit reviewed. Allergies and medications reviewed and updated. No outpatient medications prior to visit.   No facility-administered medications prior to visit.     Per HPI unless specifically indicated in ROS section below Review of Systems  Constitutional: Negative for chills, fatigue and fever.  HENT: Positive for congestion and sinus  pressure.   Respiratory: Positive for cough.   Allergic/Immunologic: Positive for environmental allergies.   Objective:  BP (!) 155/103   Pulse 78   Temp (!) 97.4 F (36.3 C) (Temporal)   Ht 5\' 4"  (1.626 m)   Wt 219 lb (99.3 kg)   BMI 37.59 kg/m   Wt Readings from Last 3 Encounters:  01/02/21 219 lb (99.3 kg)  10/31/20 227 lb 5 oz (103.1 kg)  12/30/19 205 lb (93 kg)       Physical exam: Gen: alert, NAD, not ill appearing Pulm: speaks in complete sentences without increased work of breathing Psych: normal mood, normal thought content      Results for orders placed or performed during the hospital encounter of 11/15/17  Basic metabolic panel  Result Value Ref Range   Sodium 137 135 - 145 mmol/L   Potassium 3.8 3.5 - 5.1 mmol/L   Chloride 105 101 - 111 mmol/L   CO2 22 22 - 32 mmol/L   Glucose, Bld 90 65 - 99 mg/dL   BUN 11 6 - 20 mg/dL   Creatinine, Ser 01/01/20 0.44 - 1.00 mg/dL   Calcium 8.8 (L) 8.9 - 10.3 mg/dL   GFR calc non Af Amer >60 >60 mL/min   GFR calc Af Amer >60 >60 mL/min   Anion gap 10 5 - 15  CBC  Result Value Ref Range   WBC 5.2 4.0 - 10.5 K/uL   RBC 3.96 3.87 - 5.11 MIL/uL   Hemoglobin 11.0 (L) 12.0 - 15.0 g/dL   HCT  34.8 (L) 36.0 - 46.0 %   MCV 87.9 78.0 - 100.0 fL   MCH 27.8 26.0 - 34.0 pg   MCHC 31.6 30.0 - 36.0 g/dL   RDW 10.2 72.5 - 36.6 %   Platelets 145 (L) 150 - 400 K/uL  I-stat troponin, ED  Result Value Ref Range   Troponin i, poc 0.00 0.00 - 0.08 ng/mL   Comment 3          I-Stat beta hCG blood, ED  Result Value Ref Range   I-stat hCG, quantitative <5.0 <5 mIU/mL   Comment 3          I-Stat Troponin, ED (not at [facility])  Result Value Ref Range   Troponin i, poc 0.00 0.00 - 0.08 ng/mL   Comment 3           Assessment & Plan:   Problem List Items Addressed This Visit   None   Visit Diagnoses    Cough    -  Primary   Relevant Medications   benzonatate (TESSALON) 200 MG capsule   Other Relevant Orders   Novel Coronavirus, NAA  ([facility])   Nasal congestion       Relevant Medications   fluticasone (FLONASE) 50 MCG/ACT nasal spray   Other Relevant Orders   Novel Coronavirus, NAA ([facility])       Meds ordered this encounter  Medications  . benzonatate (TESSALON) 200 MG capsule    Sig: Take 1 capsule (200 mg total) by mouth 3 (three) times daily as needed for cough.    Dispense:  15 capsule    Refill:  0    Order Specific Question:   Supervising Provider    Answer:   BEDSOLE, AMY E [[id number]]  . fluticasone (FLONASE) 50 MCG/ACT nasal spray    Sig: Place 1 spray into both nostrils 2 (two) times daily.    Dispense:  16 g    Refill:  0    Order Specific Question:   Supervising Provider    Answer:   BEDSOLE, AMY E [[id number]]   Orders Placed This Encounter  Procedures  . Novel Coronavirus, NAA ([facility])    Standing Status:   Future    Standing Expiration Date:   01/02/2022    Order Specific Question:   Is this test for diagnosis or screening    Answer:   Diagnosis of ill patient    Order Specific Question:   Symptomatic for COVID-19 as defined by CDC    Answer:   Yes    Order Specific Question:   Date of Symptom Onset    Answer:   12/30/2020    Order Specific Question:   Hospitalized for COVID-19    Answer:   No    Order Specific Question:   Admitted to ICU for COVID-19    Answer:   No    Order Specific Question:   Previously tested for COVID-19    Answer:   No    Order Specific Question:   Resident in a congregate (group) care setting    Answer:   No    Order Specific Question:   Is the patient student?    Answer:   No    Order Specific Question:   Employed in healthcare setting    Answer:   No    Order Specific Question:   Pregnant    Answer:   No    Order Specific Question:   Has patient completed COVID vaccination(s) (2 doses of  Pfizer/Moderna 1 dose of [employer])    Answer:   No    I discussed the assessment and treatment plan with the patient. The patient was provided an opportunity to ask  questions and all were answered. The patient agreed with the plan and demonstrated an understanding of the instructions. The patient was advised to call back or seek an in-person evaluation if the symptoms worsen or if the condition fails to improve as anticipated.  Follow up plan:  Nasal Congestion/Ear Pressure/Sinus Pressure: Try using Flonase (fluticasone) nasal spray. Instill 1 spray in each nostril twice daily.   You may take Benzonatate capsules for cough. Take 1 capsule by mouth three times daily as needed for cough.  Come by for testing later this afternoon.  It was a pleasure to see you today!   Doreene Nest, NP

## 2021-01-02 NOTE — Patient Instructions (Signed)
Nasal Congestion/Ear Pressure/Sinus Pressure: Try using Flonase (fluticasone) nasal spray. Instill 1 spray in each nostril twice daily.   You may take Benzonatate capsules for cough. Take 1 capsule by mouth three times daily as needed for cough.  Come by for testing later this afternoon.  It was a pleasure to see you today!

## 2021-01-02 NOTE — Assessment & Plan Note (Signed)
Likely secondary to allergy involvement vs viral URI.  Will be testing patient for Covid-19. Rx for Flonase and Jerilynn Som sent to pharmacy.  Overall she appears stable. Await results.

## 2021-01-03 ENCOUNTER — Other Ambulatory Visit: Payer: BC Managed Care – PPO

## 2021-01-03 DIAGNOSIS — R059 Cough, unspecified: Secondary | ICD-10-CM | POA: Diagnosis not present

## 2021-01-03 DIAGNOSIS — R0981 Nasal congestion: Secondary | ICD-10-CM | POA: Diagnosis not present

## 2021-01-05 DIAGNOSIS — R059 Cough, unspecified: Secondary | ICD-10-CM

## 2021-01-05 LAB — SARS-COV-2, NAA 2 DAY TAT

## 2021-01-05 LAB — NOVEL CORONAVIRUS, NAA: SARS-CoV-2, NAA: NOT DETECTED

## 2021-01-05 MED ORDER — GUAIFENESIN-CODEINE 100-10 MG/5ML PO SYRP: 5.0000 mL | ORAL_SOLUTION | Freq: Three times a day (TID) | ORAL | 0 refills | Status: DC | PRN

## 2021-01-08 ENCOUNTER — Encounter: Payer: Self-pay | Admitting: Primary Care

## 2021-01-29 ENCOUNTER — Other Ambulatory Visit: Payer: Self-pay | Admitting: Primary Care

## 2021-01-29 DIAGNOSIS — R0981 Nasal congestion: Secondary | ICD-10-CM

## 2021-02-25 DIAGNOSIS — M5442 Lumbago with sciatica, left side: Secondary | ICD-10-CM | POA: Diagnosis not present

## 2021-02-25 DIAGNOSIS — Z6837 Body mass index (BMI) 37.0-37.9, adult: Secondary | ICD-10-CM | POA: Diagnosis not present

## 2021-02-25 DIAGNOSIS — M545 Low back pain, unspecified: Secondary | ICD-10-CM | POA: Diagnosis not present

## 2021-02-25 DIAGNOSIS — I10 Essential (primary) hypertension: Secondary | ICD-10-CM | POA: Diagnosis not present

## 2021-03-02 ENCOUNTER — Encounter: Payer: Self-pay | Admitting: Primary Care

## 2021-03-02 ENCOUNTER — Other Ambulatory Visit: Payer: Self-pay

## 2021-03-02 ENCOUNTER — Ambulatory Visit (INDEPENDENT_AMBULATORY_CARE_PROVIDER_SITE_OTHER): Payer: BC Managed Care – PPO | Admitting: Primary Care

## 2021-03-02 VITALS — BP 136/82 | HR 82 | Temp 97.7°F | Ht 64.0 in | Wt 223.0 lb

## 2021-03-02 DIAGNOSIS — Z1211 Encounter for screening for malignant neoplasm of colon: Secondary | ICD-10-CM

## 2021-03-02 DIAGNOSIS — Z0000 Encounter for general adult medical examination without abnormal findings: Secondary | ICD-10-CM

## 2021-03-02 DIAGNOSIS — Z8673 Personal history of transient ischemic attack (TIA), and cerebral infarction without residual deficits: Secondary | ICD-10-CM

## 2021-03-02 DIAGNOSIS — G43709 Chronic migraine without aura, not intractable, without status migrainosus: Secondary | ICD-10-CM

## 2021-03-02 DIAGNOSIS — I10 Essential (primary) hypertension: Secondary | ICD-10-CM | POA: Diagnosis not present

## 2021-03-02 DIAGNOSIS — Z1159 Encounter for screening for other viral diseases: Secondary | ICD-10-CM | POA: Diagnosis not present

## 2021-03-02 DIAGNOSIS — Z1231 Encounter for screening mammogram for malignant neoplasm of breast: Secondary | ICD-10-CM | POA: Diagnosis not present

## 2021-03-02 LAB — CBC
HCT: 38.1 % (ref 36.0–46.0)
Hemoglobin: 12.7 g/dL (ref 12.0–15.0)
MCHC: 33.4 g/dL (ref 30.0–36.0)
MCV: 88.5 fl (ref 78.0–100.0)
Platelets: 135 10*3/uL — ABNORMAL LOW (ref 150.0–400.0)
RBC: 4.3 Mil/uL (ref 3.87–5.11)
RDW: 13.9 % (ref 11.5–15.5)
WBC: 5.2 10*3/uL (ref 4.0–10.5)

## 2021-03-02 LAB — LIPID PANEL
Cholesterol: 157 mg/dL (ref 0–200)
HDL: 46.3 mg/dL (ref >39.00)
LDL Cholesterol: 96 mg/dL (ref 0–99)
NonHDL: 110.28
Total CHOL/HDL Ratio: 3
Triglycerides: 70 mg/dL (ref 0.0–149.0)
VLDL: 14 mg/dL (ref 0.0–40.0)

## 2021-03-02 LAB — COMPREHENSIVE METABOLIC PANEL
ALT: 22 U/L (ref 0–35)
AST: 21 U/L (ref 0–37)
Albumin: 4.3 g/dL (ref 3.5–5.2)
Alkaline Phosphatase: 56 U/L (ref 39–117)
BUN: 19 mg/dL (ref 6–23)
CO2: 29 mEq/L (ref 19–32)
Calcium: 9.4 mg/dL (ref 8.4–10.5)
Chloride: 105 mEq/L (ref 96–112)
Creatinine, Ser: 0.75 mg/dL (ref 0.40–1.20)
GFR: 93.64 mL/min (ref >60.00)
Glucose, Bld: 85 mg/dL (ref 70–99)
Potassium: 4.1 mEq/L (ref 3.5–5.1)
Sodium: 141 mEq/L (ref 135–145)
Total Bilirubin: 0.7 mg/dL (ref 0.2–1.2)
Total Protein: 6.9 g/dL (ref 6.0–8.3)

## 2021-03-02 LAB — HEMOGLOBIN A1C: Hgb A1c MFr Bld: 5.8 % (ref 4.6–6.5)

## 2021-03-02 MED ORDER — HYDROCHLOROTHIAZIDE 12.5 MG PO TABS: 12.5000 mg | ORAL_TABLET | Freq: Every day | ORAL | 0 refills | Status: DC

## 2021-03-02 NOTE — Progress Notes (Signed)
Subjective:    Patient ID: Karina Neal, female    DOB: 02/29/1971, 50 y.o.   MRN: [id number]  HPI  Karina Neal is a very pleasant 50 y.o. female who presents today for complete physical.  Immunizations: -Tetanus: 2017 -Influenza: Completed last season  -Covid-19: Did not receive    Diet: Fair diet.  Exercise: She is walking daily  Eye exam: Completes annually  Dental exam: Completes semi-annually   Pap Smear: Completed per GYN Mammogram: Never completed Colonoscopy: Never completed   BP Readings from Last 3 Encounters:  03/02/21 136/82  01/02/21 (!) 155/103  11/01/20 136/85    She checks her BP at home which is 140's-150's/90's-100's. She has a family history of hypertension in all family members. She's noticed swelling to her fingers and feet at times. She denies headaches, chest pain. She was at her dentists office yesterday and her BP was 150/100. She has never been treated for hypertension in the past.   Review of Systems  Constitutional:  Negative for unexpected weight change.  HENT:  Negative for rhinorrhea.   Respiratory:  Negative for cough and shortness of breath.   Cardiovascular:  Negative for chest pain.  Gastrointestinal:  Negative for constipation and diarrhea.  Genitourinary:  Negative for difficulty urinating and menstrual problem.  Musculoskeletal:  Negative for arthralgias and myalgias.  Skin:  Negative for rash.  Allergic/Immunologic: Negative for environmental allergies.  Neurological:  Negative for dizziness, numbness and headaches.        Past Medical History:  Diagnosis Date   Asthma    Blood pressure elevated    Chicken pox    Frequent headaches     Social History   Socioeconomic History   Marital status: Married    Spouse name: Not on file   Number of children: Not on file   Years of education: Not on file   Highest education level: Not on file  Occupational History   Not on file  Tobacco Use   Smoking status: Never    Smokeless tobacco: Never  Substance and Sexual Activity   Alcohol use: Yes    Comment: social   Drug use: No   Sexual activity: Not on file  Other Topics Concern   Not on file  Social History Narrative   ** Merged History Encounter **       Social Determinants of Health   Financial Resource Strain: Not on file  Food Insecurity: Not on file  Transportation Needs: Not on file  Physical Activity: Not on file  Stress: Not on file  Social Connections: Not on file  Intimate Partner Violence: Not on file    Past Surgical History:  Procedure Laterality Date   APPENDECTOMY     CESAREAN SECTION     CESAREAN SECTION     EYE SURGERY      Family History  Problem Relation Age of Onset   Hyperlipidemia Mother    Hypertension Mother    Hyperlipidemia Father    Hypertension Father    Diabetes Father    Hypertension Sister    Hypertension Brother     Allergies  Allergen Reactions   Shellfish Allergy Anaphylaxis    Throat swelling, dyspnea   Penicillins Hives    Has patient had a PCN reaction causing immediate rash, facial/tongue/throat swelling, SOB or lightheadedness with hypotension: Unknown Has patient had a PCN reaction causing severe rash involving mucus membranes or skin necrosis: No Has patient had a PCN reaction that required hospitalization: No Has  patient had a PCN reaction occurring within the last 10 years: No If all of the above answers are "NO", then may proceed with Cephalosporin use.     Current Outpatient Medications on File Prior to Visit  Medication Sig Dispense Refill   fluticasone (FLONASE) 50 MCG/ACT nasal spray PLACE 1 SPRAY INTO BOTH NOSTRILS 2 (TWO) TIMES DAILY 16 mL 0   lidocaine (LIDODERM) 5 % Place onto the skin.     diazepam (VALIUM) 5 MG tablet Take by mouth.     No current facility-administered medications on file prior to visit.    BP 136/82   Pulse 82   Temp 97.7 F (36.5 C) (Temporal)   Ht 5\' 4"  (1.626 m)   Wt 223 lb (101.2 kg)    SpO2 97%   BMI 38.28 kg/m  Objective:   Physical Exam HENT:     Right Ear: Tympanic membrane and ear canal normal.     Left Ear: Tympanic membrane and ear canal normal.     Nose: Nose normal.  Eyes:     Conjunctiva/sclera: Conjunctivae normal.     Pupils: Pupils are equal, round, and reactive to light.  Neck:     Thyroid: No thyromegaly.  Cardiovascular:     Rate and Rhythm: Normal rate and regular rhythm.     Heart sounds: No murmur heard. Pulmonary:     Effort: Pulmonary effort is normal.     Breath sounds: Normal breath sounds. No rales.  Abdominal:     General: Bowel sounds are normal.     Palpations: Abdomen is soft.     Tenderness: There is no abdominal tenderness.  Musculoskeletal:        General: Normal range of motion.     Cervical back: Neck supple.  Lymphadenopathy:     Cervical: No cervical adenopathy.  Skin:    General: Skin is warm and dry.     Findings: No rash.  Neurological:     Mental Status: She is alert and oriented to person, place, and time.     Cranial Nerves: No cranial nerve deficit.     Deep Tendon Reflexes: Reflexes are normal and symmetric.  Psychiatric:        Mood and Affect: Mood normal.          Assessment & Plan:      This visit occurred during the SARS-CoV-2 public health emergency.  Safety protocols were in place, including screening questions prior to the visit, additional usage of staff PPE, and extensive cleaning of exam room while observing appropriate contact time as indicated for disinfecting solutions.

## 2021-03-02 NOTE — Assessment & Plan Note (Signed)
No migraines in nearly one year. Continue to monitor.

## 2021-03-02 NOTE — Patient Instructions (Signed)
Stop by the lab prior to leaving today. I will notify you of your results once received.   [facility].   You will be contacted regarding your referral to GI.  Please let us know if you have not been contacted within two weeks.   Start hydrochlorothiazide 12.5 mg once daily for blood pressure and swelling.  Please schedule a follow up visit to meet back with me in 2-3 weeks for blood pressure check.   It was a pleasure to see you today!  Preventive Care 52-55 Years Old, Female Preventive care refers to lifestyle choices and visits with your health care provider that can promote health and wellness. This includes: A yearly physical exam. This is also called an annual wellness visit. Regular dental and eye exams. Immunizations. Screening for certain conditions. Healthy lifestyle choices, such as: Eating a healthy diet. Getting regular exercise. Not using drugs or products that contain nicotine and tobacco. Limiting alcohol use. What can I expect for my preventive care visit? Physical exam Your health care provider will check your: Height and weight. These may be used to calculate your BMI (body mass index). BMI is a measurement that tells if you are at a healthy weight. Heart rate and blood pressure. Body temperature. Skin for abnormal spots. Counseling Your health care provider may ask you questions about your: Past medical problems. Family's medical history. Alcohol, tobacco, and drug use. Emotional well-being. Home life and relationship well-being. Sexual activity. Diet, exercise, and sleep habits. Work and work Statistician. Access to firearms. Method of birth control. Menstrual cycle. Pregnancy history. What immunizations do I need?  Vaccines are usually given at various ages, according to a schedule. Your health care provider will recommend vaccines for you based on your age, medicalhistory, and lifestyle or other factors, such as  travel or where you work. What tests do I need? Blood tests Lipid and cholesterol levels. These may be checked every 5 years, or more often if you are over 75 years old. Hepatitis C test. Hepatitis B test. Screening Lung cancer screening. You may have this screening every year starting at age 34 if you have a 30-pack-year history of smoking and currently smoke or have quit within the past 15 years. Colorectal cancer screening. All adults should have this screening starting at age 94 and continuing until age 11. Your health care provider may recommend screening at age 82 if you are at increased risk. You will have tests every 1-10 years, depending on your results and the type of screening test. Diabetes screening. This is done by checking your blood sugar (glucose) after you have not eaten for a while (fasting). You may have this done every 1-3 years. Mammogram. This may be done every 1-2 years. Talk with your health care provider about when you should start having regular mammograms. This may depend on whether you have a family history of breast cancer. BRCA-related cancer screening. This may be done if you have a family history of breast, ovarian, tubal, or peritoneal cancers. Pelvic exam and Pap test. This may be done every 3 years starting at age 23. Starting at age 34, this may be done every 5 years if you have a Pap test in combination with an HPV test. Other tests STD (sexually transmitted disease) testing, if you are at risk. Bone density scan. This is done to screen for osteoporosis. You may have this scan if you are at high risk for osteoporosis. Talk with your health  care provider about your test results, treatment options,and if necessary, the need for more tests. Follow these instructions at home: Eating and drinking  Eat a diet that includes fresh fruits and vegetables, whole grains, lean protein, and low-fat dairy products. Take vitamin and mineral supplements as  recommended by your health care provider. Do not drink alcohol if: Your health care provider tells you not to drink. You are pregnant, may be pregnant, or are planning to become pregnant. If you drink alcohol: Limit how much you have to 0-1 drink a day. Be aware of how much alcohol is in your drink. In the [place] one drink equals one 12 oz bottle of beer (355 mL), one 5 oz glass of wine (148 mL), or one 1 oz glass of hard liquor (44 mL).  Lifestyle Take daily care of your teeth and gums. Brush your teeth every morning and night with fluoride toothpaste. Floss one time each day. Stay active. Exercise for at least 30 minutes 5 or more days each week. Do not use any products that contain nicotine or tobacco, such as cigarettes, e-cigarettes, and chewing tobacco. If you need help quitting, ask your health care provider. Do not use drugs. If you are sexually active, practice safe sex. Use a condom or other form of protection to prevent STIs (sexually transmitted infections). If you do not wish to become pregnant, use a form of birth control. If you plan to become pregnant, see your health care provider for a prepregnancy visit. If told by your health care provider, take low-dose aspirin daily starting at age 94. Find healthy ways to cope with stress, such as: Meditation, yoga, or listening to music. Journaling. Talking to a trusted person. Spending time with friends and family. Safety Always wear your seat belt while driving or riding in a vehicle. Do not drive: If you have been drinking alcohol. Do not ride with someone who has been drinking. When you are tired or distracted. While texting. Wear a helmet and other protective equipment during sports activities. If you have firearms in your house, make sure you follow all gun safety procedures. What's next? Visit your health care provider once a year for an annual wellness visit. Ask your health care provider how often you should have your  eyes and teeth checked. Stay up to date on all vaccines. This information is not intended to replace advice given to you by your health care provider. Make sure you discuss any questions you have with your healthcare provider. Document Revised: 05/30/2020 Document Reviewed: 05/07/2018 Elsevier Patient Education  2022 [employer].

## 2021-03-02 NOTE — Assessment & Plan Note (Signed)
Several documented readings above goal, home readings above goal, dentists office reading above goal.  Given strong FH of hypertension coupled with readings we will treat. Rx for HCTZ 12.5 mg sent to pharmacy.   We will see her back in 2-3 weeks for BP check and BMP.

## 2021-03-02 NOTE — Assessment & Plan Note (Signed)
No new symptoms. Is not on statin therapy.  Checking labs today.

## 2021-03-02 NOTE — Assessment & Plan Note (Signed)
Immunizations UTD. Pap smear UTD per patient, follows with GYN. Mammogram overdue, ordered today. Colonoscopy due, referral placed for GI.  Discussed the importance of a healthy diet and regular exercise in order for weight loss, and to reduce the risk of further co-morbidity.  Exam today as noted. Labs pending.

## 2021-03-05 LAB — HEPATITIS C ANTIBODY
Hepatitis C Ab: NONREACTIVE
SIGNAL TO CUT-OFF: 0.02 (ref <1.00)

## 2021-03-08 DIAGNOSIS — Z20822 Contact with and (suspected) exposure to covid-19: Secondary | ICD-10-CM | POA: Diagnosis not present

## 2021-03-15 ENCOUNTER — Telehealth (INDEPENDENT_AMBULATORY_CARE_PROVIDER_SITE_OTHER): Payer: BC Managed Care – PPO | Admitting: Gastroenterology

## 2021-03-15 DIAGNOSIS — Z1211 Encounter for screening for malignant neoplasm of colon: Secondary | ICD-10-CM

## 2021-03-15 MED ORDER — PEG 3350-KCL-NA BICARB-NACL 420 G PO SOLR: 4000.0000 mL | Freq: Once | ORAL | 0 refills | Status: AC

## 2021-03-15 NOTE — Progress Notes (Signed)
Gastroenterology Pre-Procedure Review  Request Date: 04/20/21 Requesting Physician: Dr. Maximino Greenland  PATIENT REVIEW QUESTIONS: The patient responded to the following health history questions as indicated:    1. Are you having any GI issues? no 2. Do you have a personal history of Polyps? no 3. Do you have a family history of Colon Cancer or Polyps? no 4. Diabetes Mellitus? no 5. Joint replacements in the past 12 months?no 6. Major health problems in the past 3 months?no 7. Any artificial heart valves, MVP, or defibrillator?no    MEDICATIONS & ALLERGIES:    Patient reports the following regarding taking any anticoagulation/antiplatelet therapy:   Plavix, Coumadin, Eliquis, Xarelto, Lovenox, Pradaxa, Brilinta, or Effient? no Aspirin? no  Patient confirms/reports the following medications:  Current Outpatient Medications  Medication Sig Dispense Refill   fluticasone (FLONASE) 50 MCG/ACT nasal spray PLACE 1 SPRAY INTO BOTH NOSTRILS 2 (TWO) TIMES DAILY 16 mL 0   hydrochlorothiazide (HYDRODIURIL) 12.5 MG tablet Take 1 tablet (12.5 mg total) by mouth daily. For blood pressure. 30 tablet 0   lidocaine (LIDODERM) 5 % Place onto the skin.     No current facility-administered medications for this visit.    Patient confirms/reports the following allergies:  Allergies  Allergen Reactions   Shellfish Allergy Anaphylaxis    Throat swelling, dyspnea   Penicillins Hives    Has patient had a PCN reaction causing immediate rash, facial/tongue/throat swelling, SOB or lightheadedness with hypotension: Unknown Has patient had a PCN reaction causing severe rash involving mucus membranes or skin necrosis: No Has patient had a PCN reaction that required hospitalization: No Has patient had a PCN reaction occurring within the last 10 years: No If all of the above answers are "NO", then may proceed with Cephalosporin use.     No orders of the defined types were placed in this  encounter.   AUTHORIZATION INFORMATION Primary Insurance: 1D#: Group #:  Secondary Insurance: 1D#: Group #:  SCHEDULE INFORMATION: Date: 04/20/21 Time: Location: [facility]

## 2021-03-21 ENCOUNTER — Ambulatory Visit: Payer: BC Managed Care – PPO | Admitting: Primary Care

## 2021-03-27 ENCOUNTER — Ambulatory Visit: Payer: BC Managed Care – PPO | Admitting: Primary Care

## 2021-03-27 ENCOUNTER — Encounter: Payer: Self-pay | Admitting: Primary Care

## 2021-03-27 ENCOUNTER — Other Ambulatory Visit: Payer: Self-pay

## 2021-03-27 DIAGNOSIS — D696 Thrombocytopenia, unspecified: Secondary | ICD-10-CM | POA: Diagnosis not present

## 2021-03-27 DIAGNOSIS — I10 Essential (primary) hypertension: Secondary | ICD-10-CM | POA: Diagnosis not present

## 2021-03-27 LAB — BASIC METABOLIC PANEL
BUN: 16 mg/dL (ref 6–23)
CO2: 29 mEq/L (ref 19–32)
Calcium: 9.4 mg/dL (ref 8.4–10.5)
Chloride: 103 mEq/L (ref 96–112)
Creatinine, Ser: 0.86 mg/dL (ref 0.40–1.20)
GFR: 79.42 mL/min (ref >60.00)
Glucose, Bld: 115 mg/dL — ABNORMAL HIGH (ref 70–99)
Potassium: 3.8 mEq/L (ref 3.5–5.1)
Sodium: 140 mEq/L (ref 135–145)

## 2021-03-27 MED ORDER — HYDROCHLOROTHIAZIDE 12.5 MG PO TABS: 12.5000 mg | ORAL_TABLET | Freq: Every day | ORAL | 3 refills | Status: DC

## 2021-03-27 NOTE — Assessment & Plan Note (Signed)
Significantly improved on HCTZ 12.5 mg daily, continue same.Marland Kitchen BMP pending. Refills sent to pharmacy,

## 2021-03-27 NOTE — Assessment & Plan Note (Signed)
Chronic, noted on prior and recent labs. Stable without significant change.  Continue to monitor.

## 2021-03-27 NOTE — Progress Notes (Signed)
Subjective:    Patient ID: Karina Neal, female    DOB: 03-10-1971, 50 y.o.   MRN: [id number]  HPI  Karina Neal is a very pleasant 50 y.o. female with a history of hypertension, migraines, CVA who presents today for follow up of hypertension.   She was last evaluated on 03/02/21 for CPE, BP was noted to be above goal during prior visits, home readings, and recently at her dentists office. During her last visit she agreed to treatment and was treated with HCTZ 12.5 mg and asked to follow up.  Since her last visit she's compliant to her HCTZ 12.5 mg. She's checking her BP at home which is running 140-150's/80's-90's. She's feeling better, more energy, less headaches.   She has never been told that she had thrombocytopenia. During her last visit CBC showed platelet count of 135. Five years ago she had a platelet count of 136. She denies easy bleeding, bruising, family history of blood disorders.   BP Readings from Last 3 Encounters:  03/27/21 110/78  03/02/21 136/82  01/02/21 (!) 155/103   Wt Readings from Last 3 Encounters:  03/27/21 222 lb (100.7 kg)  03/02/21 223 lb (101.2 kg)  01/02/21 219 lb (99.3 kg)        Review of Systems  Constitutional:  Negative for fatigue.  Cardiovascular:  Negative for chest pain.  Neurological:  Negative for dizziness and headaches.        Past Medical History:  Diagnosis Date   Asthma    Blood pressure elevated    Chicken pox    Frequent headaches    Left sided numbness 04/23/2015    Social History   Socioeconomic History   Marital status: Married    Spouse name: Not on file   Number of children: Not on file   Years of education: Not on file   Highest education level: Not on file  Occupational History   Not on file  Tobacco Use   Smoking status: Never   Smokeless tobacco: Never  Substance and Sexual Activity   Alcohol use: Yes    Comment: social   Drug use: No   Sexual activity: Not on file  Other Topics Concern   Not  on file  Social History Narrative   ** Merged History Encounter **       Social Determinants of Health   Financial Resource Strain: Not on file  Food Insecurity: Not on file  Transportation Needs: Not on file  Physical Activity: Not on file  Stress: Not on file  Social Connections: Not on file  Intimate Partner Violence: Not on file    Past Surgical History:  Procedure Laterality Date   APPENDECTOMY     CESAREAN SECTION     CESAREAN SECTION     EYE SURGERY      Family History  Problem Relation Age of Onset   Hyperlipidemia Mother    Hypertension Mother    Hyperlipidemia Father    Hypertension Father    Diabetes Father    Hypertension Sister    Hypertension Brother     Allergies  Allergen Reactions   Shellfish Allergy Anaphylaxis    Throat swelling, dyspnea   Penicillins Hives    Has patient had a PCN reaction causing immediate rash, facial/tongue/throat swelling, SOB or lightheadedness with hypotension: Unknown Has patient had a PCN reaction causing severe rash involving mucus membranes or skin necrosis: No Has patient had a PCN reaction that required hospitalization: No Has patient had a  PCN reaction occurring within the last 10 years: No If all of the above answers are "NO", then may proceed with Cephalosporin use.     Current Outpatient Medications on File Prior to Visit  Medication Sig Dispense Refill   fluticasone (FLONASE) 50 MCG/ACT nasal spray PLACE 1 SPRAY INTO BOTH NOSTRILS 2 (TWO) TIMES DAILY 16 mL 0   hydrochlorothiazide (HYDRODIURIL) 12.5 MG tablet Take 1 tablet (12.5 mg total) by mouth daily. For blood pressure. 30 tablet 0   naproxen (NAPROSYN) 500 MG tablet Take 500 mg by mouth 2 (two) times daily.     No current facility-administered medications on file prior to visit.    BP 110/78   Pulse 87   Temp 97.6 F (36.4 C) (Temporal)   Ht 5\' 4"  (1.626 m)   Wt 222 lb (100.7 kg)   SpO2 96%   BMI 38.11 kg/m  Objective:   Physical  Exam Cardiovascular:     Rate and Rhythm: Normal rate and regular rhythm.  Pulmonary:     Effort: Pulmonary effort is normal.     Breath sounds: Normal breath sounds.  Musculoskeletal:     Cervical back: Neck supple.  Skin:    General: Skin is warm and dry.          Assessment & Plan:      This visit occurred during the SARS-CoV-2 public health emergency.  Safety protocols were in place, including screening questions prior to the visit, additional usage of staff PPE, and extensive cleaning of exam room while observing appropriate contact time as indicated for disinfecting solutions.

## 2021-03-27 NOTE — Patient Instructions (Signed)
Continue taking hydrochlorothiazide 12.5 mg daily for blood pressure.  Stop by the lab prior to leaving today. I will notify you of your results once received.   It was a pleasure to see you today!

## 2021-04-19 ENCOUNTER — Telehealth: Payer: Self-pay

## 2021-04-19 NOTE — Telephone Encounter (Signed)
Called patient but had to leave her a voicemail. Then I called her husband and asked him if he could contact his wife and ask her if she would do her procedure tomorrow. He stated that he knew that she would not proceed since she had not been prepping. I then asked him if he would like to reschedule her procedure and he stated that he would ask her to call us and that way we both agreed on the date. I told him that it was great. I then called Trish and let her know to cancel her procedure for tomorrow.

## 2021-04-20 ENCOUNTER — Encounter: Admission: RE | Payer: Self-pay | Source: Home / Self Care

## 2021-04-20 ENCOUNTER — Ambulatory Visit
Admission: RE | Admit: 2021-04-20 | Payer: BC Managed Care – PPO | Source: Home / Self Care | Admitting: Gastroenterology

## 2021-04-20 SURGERY — COLONOSCOPY WITH PROPOFOL
Anesthesia: General

## 2021-05-10 ENCOUNTER — Ambulatory Visit: Payer: BC Managed Care – PPO | Admitting: Primary Care

## 2021-05-10 ENCOUNTER — Encounter: Payer: Self-pay | Admitting: Primary Care

## 2021-05-10 ENCOUNTER — Other Ambulatory Visit: Payer: Self-pay

## 2021-05-10 VITALS — BP 118/68 | HR 70 | Temp 98.7°F | Ht 64.0 in | Wt 222.0 lb

## 2021-05-10 DIAGNOSIS — M79673 Pain in unspecified foot: Secondary | ICD-10-CM | POA: Diagnosis not present

## 2021-05-10 DIAGNOSIS — G8929 Other chronic pain: Secondary | ICD-10-CM

## 2021-05-10 DIAGNOSIS — Z23 Encounter for immunization: Secondary | ICD-10-CM

## 2021-05-10 NOTE — Progress Notes (Signed)
Subjective:    Patient ID: Karina Neal, female    DOB: 09-19-1970, 50 y.o.   MRN: [id number]  HPI  Karina Neal is a very pleasant 50 y.o. female with a history of hypertension, migraines, stroke, thrombocytopenia who presents today to discuss foot pain.   Her pain is located to the bilateral heels with radiation of pain up the lateral aspect of both feet. Symptoms began about two-three months ago, thought her pain was secondary to her shoes at work. She switched her shoes at work and has found no improvement.   Her pain is constant, sharp, worse with standing and especially in the morning when getting out of bed. 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 She's been doing this for the last 31 years.   She's not tried using insoles to her shoes. She's taking Naproxen with little improvement. She denies injury, numbness/tingling, joint pain to the toes, color changes.   BP Readings from Last 3 Encounters:  05/10/21 118/68  03/27/21 110/78  03/02/21 136/82      Review of Systems  Musculoskeletal:        Bilateral heel and foot pain  Skin:  Negative for color change.  Neurological:  Negative for weakness and numbness.        Past Medical History:  Diagnosis Date   Asthma    Blood pressure elevated    Chicken pox    Frequent headaches    Left sided numbness 04/23/2015    Social History   Socioeconomic History   Marital status: Married    Spouse name: Not on file   Number of children: Not on file   Years of education: Not on file   Highest education level: Not on file  Occupational History   Not on file  Tobacco Use   Smoking status: Never   Smokeless tobacco: Never  Substance and Sexual Activity   Alcohol use: Yes    Comment: social   Drug use: No   Sexual activity: Not on file  Other Topics Concern   Not on file  Social History Narrative   ** Merged History Encounter **       Social Determinants of Health   Financial  Resource Strain: Not on file  Food Insecurity: Not on file  Transportation Needs: Not on file  Physical Activity: Not on file  Stress: Not on file  Social Connections: Not on file  Intimate Partner Violence: Not on file    Past Surgical History:  Procedure Laterality Date   APPENDECTOMY     CESAREAN SECTION     CESAREAN SECTION     EYE SURGERY      Family History  Problem Relation Age of Onset   Hyperlipidemia Mother    Hypertension Mother    Hyperlipidemia Father    Hypertension Father    Diabetes Father    Hypertension Sister    Hypertension Brother     Allergies  Allergen Reactions   Shellfish Allergy Anaphylaxis    Throat swelling, dyspnea   Penicillins Hives    Has patient had a PCN reaction causing immediate rash, facial/tongue/throat swelling, SOB or lightheadedness with hypotension: Unknown Has patient had a PCN reaction causing severe rash involving mucus membranes or skin necrosis: No Has patient had a PCN reaction that required hospitalization: No Has patient had a PCN reaction occurring within the last 10 years: No If all of the above answers are "NO", then may proceed  with Cephalosporin use.     Current Outpatient Medications on File Prior to Visit  Medication Sig Dispense Refill   fluticasone (FLONASE) 50 MCG/ACT nasal spray PLACE 1 SPRAY INTO BOTH NOSTRILS 2 (TWO) TIMES DAILY 16 mL 0   hydrochlorothiazide (HYDRODIURIL) 12.5 MG tablet Take 1 tablet (12.5 mg total) by mouth daily. For blood pressure. 90 tablet 3   naproxen (NAPROSYN) 500 MG tablet Take 500 mg by mouth 2 (two) times daily.     No current facility-administered medications on file prior to visit.    BP 118/68   Pulse 70   Temp 98.7 F (37.1 C) (Temporal)   Ht 5\' 4"  (1.626 m)   Wt 222 lb (100.7 kg)   SpO2 97%   BMI 38.11 kg/m  Objective:   Physical Exam Cardiovascular:     Rate and Rhythm: Normal rate and regular rhythm.  Pulmonary:     Effort: Pulmonary effort is normal.      Breath sounds: Normal breath sounds.  Musculoskeletal:       Feet:  Feet:     Right foot:     Skin integrity: Skin integrity normal.     Toenail Condition: Right toenails are normal.     Left foot:     Skin integrity: Skin integrity normal.     Toenail Condition: Left toenails are normal.     Comments: Tenderness to bilateral heels with mild palpation. Normal ROM. No joint swelling, erythema, or tenderness to toes.  Neurological:     Mental Status: She is alert.          Assessment & Plan:      This visit occurred during the SARS-CoV-2 public health emergency.  Safety protocols were in place, including screening questions prior to the visit, additional usage of staff PPE, and extensive cleaning of exam room while observing appropriate contact time as indicated for disinfecting solutions.

## 2021-05-10 NOTE — Assessment & Plan Note (Signed)
Bilaterally. Suspect heel spurs.  Our xray machine is down, offered xrays at an outpatient imaging site but she kindly declines as she will likely have these completed with podiatry.  Discussed use of heel cups for support, massage, NSAID use. Referral placed to podiatry.

## 2021-05-10 NOTE — Patient Instructions (Addendum)
You will be contacted regarding your referral to podiatry.  Please let us know if you have not been contacted within two weeks.   Try adding heel cups into your shoes for support.  Massage the foot as discussed.  It was a pleasure to see you today!     Influenza (Flu) Vaccine (Inactivated or Recombinant): What You Need to Know 1. Why get vaccinated? Influenza vaccine can prevent influenza (flu). Flu is a contagious disease that spreads around the [place] every year, usually between October and May. Anyone can get the flu, but it is more dangerous for some people. Infants and young children, people 46 years and older, pregnant people, and people with certain health conditions or a weakened immune system are at greatest risk of flu complications. Pneumonia, bronchitis, sinus infections, and ear infections are examples of flu-related complications. If you have a medical condition, such as heart disease, cancer, or diabetes, flu can make it worse. Flu can cause fever and chills, sore throat, muscle aches, fatigue, cough, headache, and runny or stuffy nose. Some people may have vomiting and diarrhea, though this is more common in children than adults. In an average year, thousands of people in the [place] die from flu, and many more are hospitalized. Flu vaccine prevents millions of illnesses and flu-related visits to the doctor each year. 2. Influenza vaccines CDC recommends everyone 6 months and older get vaccinated every flu season. Children 6 months through 55 years of age may need 2 doses during a single flu season. Everyone else needs only 1 dose each flu season. It takes about 2 weeks for protection to develop after vaccination. There are many flu viruses, and they are always changing. Each year a new flu vaccine is made to protect against the influenza viruses believed to be likely to cause disease in the upcoming flu season. Even when the vaccine doesn't exactly match these  viruses, it may still provide some protection. Influenza vaccine does not cause flu. Influenza vaccine may be given at the same time as other vaccines. 3. Talk with your health care provider Tell your vaccination provider if the person getting the vaccine: Has had an allergic reaction after a previous dose of influenza vaccine, or has any severe, life-threatening allergies Has ever had Guillain-Barr Syndrome (also called "GBS") In some cases, your health care provider may decide to postpone influenza vaccination until a future visit. Influenza vaccine can be administered at any time during pregnancy. People who are or will be pregnant during influenza season should receive inactivated influenza vaccine. People with minor illnesses, such as a cold, may be vaccinated. People who are moderately or severely ill should usually wait until they recover before getting influenza vaccine. Your health care provider can give you more information. 4. Risks of a vaccine reaction Soreness, redness, and swelling where the shot is given, fever, muscle aches, and headache can happen after influenza vaccination. There may be a very small increased risk of Guillain-Barr Syndrome (GBS) after inactivated influenza vaccine (the flu shot). Young children who get the flu shot along with pneumococcal vaccine (PCV13) and/or DTaP vaccine at the same time might be slightly more likely to have a seizure caused by fever. Tell your health care provider if a child who is getting flu vaccine has ever had a seizure. People sometimes faint after medical procedures, including vaccination. Tell your provider if you feel dizzy or have vision changes or ringing in the ears. As with any medicine, there is a very  remote chance of a vaccine causing a severe allergic reaction, other serious injury, or death. 5. What if there is a serious problem? An allergic reaction could occur after the vaccinated person leaves the clinic. If you see  signs of a severe allergic reaction (hives, swelling of the face and throat, difficulty breathing, a fast heartbeat, dizziness, or weakness), call 9-1-1 and get the person to the nearest hospital. For other signs that concern you, call your health care provider. Adverse reactions should be reported to the Vaccine Adverse Event Reporting System (VAERS). Your health care provider will usually file this report, or you can do it yourself. Visit the VAERS website at www.vaers.LAgents.no or call 9318591631. VAERS is only for reporting reactions, and VAERS staff members do not give medical advice. 6. The National Vaccine Injury Compensation Program The [employer] Vaccine Injury Compensation Program (VICP) is a federal program that was created to compensate people who may have been injured by certain vaccines. Claims regarding alleged injury or death due to vaccination have a time limit for filing, which may be as short as two years. Visit the VICP website at SpiritualWord.at or call 3135837854 to learn about the program and about filing a claim. 7. How can I learn more? Ask your health care provider. Call your local or state health department. Visit the website of the Food and Drug Administration (FDA) for vaccine package inserts and additional information at FinderList.no. Contact the Centers for Disease Control and Prevention (CDC): Call 712-654-7978 (1-800-CDC-INFO) or Visit CDC's website at BiotechRoom.com.cy. Vaccine Information Statement Inactivated Influenza Vaccine (04/14/2020) This information is not intended to replace advice given to you by your health care provider. Make sure you discuss any questions you have with your health care provider. Document Revised: 06/01/2020 Document Reviewed: 06/01/2020 Elsevier Patient Education  2022 [employer].

## 2021-05-24 ENCOUNTER — Ambulatory Visit: Payer: BC Managed Care – PPO | Admitting: Podiatry

## 2021-05-24 ENCOUNTER — Ambulatory Visit (INDEPENDENT_AMBULATORY_CARE_PROVIDER_SITE_OTHER): Payer: BC Managed Care – PPO

## 2021-05-24 ENCOUNTER — Other Ambulatory Visit: Payer: Self-pay

## 2021-05-24 ENCOUNTER — Other Ambulatory Visit: Payer: Self-pay | Admitting: Podiatry

## 2021-05-24 DIAGNOSIS — M722 Plantar fascial fibromatosis: Secondary | ICD-10-CM

## 2021-05-24 DIAGNOSIS — M79671 Pain in right foot: Secondary | ICD-10-CM

## 2021-05-25 NOTE — Progress Notes (Signed)
Subjective:  Patient ID: Karina Neal, female    DOB: Apr 03, 1971,  MRN: [id number]  Chief Complaint  Patient presents with   Foot Pain    Bilateral foot pain mostly in the heel and worse in the morning    50 y.o. female presents with the above complaint.  Patient presents with bilateral heel pain that has been going on for quite some time.  Patient states is mostly on the bottom part of the foot worse in the morning.  Mostly when she is about to get up.  Sharp intense pain sometimes though the toes will go numb.  She states that it has gotten worse over time hurts with ambulation she has not seen anyone else prior to seeing me she would like to discuss treatment options.   Review of Systems: Negative except as noted in the HPI. Denies N/V/F/Ch.  Past Medical History:  Diagnosis Date   Asthma    Blood pressure elevated    Chicken pox    Frequent headaches    Left sided numbness 04/23/2015    Current Outpatient Medications:    fluticasone (FLONASE) 50 MCG/ACT nasal spray, PLACE 1 SPRAY INTO BOTH NOSTRILS 2 (TWO) TIMES DAILY, Disp: 16 mL, Rfl: 0   hydrochlorothiazide (HYDRODIURIL) 12.5 MG tablet, Take 1 tablet (12.5 mg total) by mouth daily. For blood pressure., Disp: 90 tablet, Rfl: 3   naproxen (NAPROSYN) 500 MG tablet, Take 500 mg by mouth 2 (two) times daily., Disp: , Rfl:   Social History   Tobacco Use  Smoking Status Never  Smokeless Tobacco Never    Allergies  Allergen Reactions   Shellfish Allergy Anaphylaxis    Throat swelling, dyspnea   Penicillins Hives    Has patient had a PCN reaction causing immediate rash, facial/tongue/throat swelling, SOB or lightheadedness with hypotension: Unknown Has patient had a PCN reaction causing severe rash involving mucus membranes or skin necrosis: No Has patient had a PCN reaction that required hospitalization: No Has patient had a PCN reaction occurring within the last 10 years: No If all of the above answers are "NO", then may  proceed with Cephalosporin use.    Objective:  There were no vitals filed for this visit. There is no height or weight on file to calculate BMI. Constitutional Well developed. Well nourished.  Vascular Dorsalis pedis pulses palpable bilaterally. Posterior tibial pulses palpable bilaterally. Capillary refill normal to all digits.  No cyanosis or clubbing noted. Pedal hair growth normal.  Neurologic Normal speech. Oriented to person, place, and time. Epicritic sensation to light touch grossly present bilaterally.  Dermatologic Nails well groomed and normal in appearance. No open wounds. No skin lesions.  Orthopedic: Normal joint ROM without pain or crepitus bilaterally. No visible deformities. Tender to palpation at the calcaneal tuber bilaterally. No pain with calcaneal squeeze right. Ankle ROM diminished range of motion bilaterally. Silfverskiold Test: positive bilaterally.   Radiographs: Taken and reviewed. No acute fractures or dislocations. No evidence of stress fracture.  Plantar heel spur present. Posterior heel spur present.   Assessment:   1. Plantar fasciitis of right foot   2. Plantar fasciitis of left foot    Plan:  Patient was evaluated and treated and all questions answered.  Plantar Fasciitis, bilaterally - XR reviewed as above.  - Educated on icing and stretching. Instructions given.  - Injection delivered to the plantar fascia as below. - DME: Plantar Fascial Brace - Pharmacologic management: None  Procedure: Injection Tendon/Ligament Location: Bilateral plantar fascia at the glabrous  junction; medial approach. Skin Prep: alcohol Injectate: 0.5 cc 0.5% marcaine plain, 0.5 cc of 1% Lidocaine, 0.5 cc kenalog 10. Disposition: Patient tolerated procedure well. Injection site dressed with a band-aid.  No follow-ups on file.

## 2021-06-01 ENCOUNTER — Ambulatory Visit
Admission: EM | Admit: 2021-06-01 | Discharge: 2021-06-01 | Disposition: A | Discharge status: Home / Self Care | Payer: BC Managed Care – PPO | Attending: Emergency Medicine | Admitting: Emergency Medicine

## 2021-06-01 ENCOUNTER — Encounter: Payer: Self-pay | Admitting: Emergency Medicine

## 2021-06-01 ENCOUNTER — Other Ambulatory Visit: Payer: Self-pay

## 2021-06-01 DIAGNOSIS — I10 Essential (primary) hypertension: Secondary | ICD-10-CM | POA: Diagnosis not present

## 2021-06-01 DIAGNOSIS — J069 Acute upper respiratory infection, unspecified: Secondary | ICD-10-CM

## 2021-06-01 DIAGNOSIS — Z1152 Encounter for screening for COVID-19: Secondary | ICD-10-CM | POA: Diagnosis not present

## 2021-06-01 HISTORY — DX: Essential (primary) hypertension: I10

## 2021-06-01 MED ORDER — BENZONATATE 100 MG PO CAPS: 100.0000 mg | ORAL_CAPSULE | Freq: Three times a day (TID) | ORAL | 0 refills | Status: DC | PRN

## 2021-06-01 NOTE — Discharge Instructions (Signed)
Your COVID test is pending.  You should self quarantine until the test result is back.    Take the prescribed Tessalon as needed for cough.  Take Tylenol or ibuprofen as needed for fever or discomfort.  Rest and keep yourself hydrated.    Follow-up with your primary care provider if your symptoms are not improving.    Your blood pressure is elevated today at 149/99.  Please have this rechecked by your primary care provider in 2-4 weeks.

## 2021-06-01 NOTE — ED Provider Notes (Signed)
Karina Neal    CSN: [id number] Arrival date & time: 06/01/21  1350      History   Chief Complaint Chief Complaint  Patient presents with   Sore Throat   Fatigue   Headache   Nasal Congestion    HPI Karina Neal is a 50 y.o. female.  Patient presents with 2-day history of fatigue, headache, nasal congestion, sore throat.  She denies fever, rash, cough, wheezing, shortness of breath, or other symptoms.  No treatments attempted at home.  Her medical history includes hypertension, asthma, and frequent headaches.  The history is provided by the patient and medical records.   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      Home Medications    Prior to Admission medications   Medication Sig Start Date End Date Taking? Authorizing Provider  benzonatate (TESSALON) 100 MG capsule Take 1 capsule (100 mg total) by mouth 3 (three) times daily as needed for cough. 06/01/21  Yes Mickie Bail, NP  fluticasone (FLONASE) 50 MCG/ACT nasal spray PLACE 1 SPRAY INTO BOTH NOSTRILS 2 (TWO) TIMES DAILY 01/30/21   Doreene Nest, NP  hydrochlorothiazide (HYDRODIURIL) 12.5 MG tablet Take 1 tablet (12.5 mg total) by mouth daily. For blood pressure. 03/27/21   Doreene Nest, NP  naproxen (NAPROSYN) 500 MG tablet Take 500 mg by mouth 2 (two) times daily. 03/24/21   [provider]     Family History Family History  Problem Relation Age of Onset   Hyperlipidemia Mother    Hypertension Mother    Hyperlipidemia Father    Hypertension Father    Diabetes Father    Hypertension Sister    Hypertension Brother     Social History Social History   Tobacco Use   Smoking status: Never   Smokeless tobacco: Never  Substance Use Topics   Alcohol use: Yes    Comment: social   Drug use: No     Allergies   Shellfish allergy and Penicillins   Review of Systems Review of Systems  Constitutional:  Positive for fatigue. Negative for chills and fever.  HENT:  Positive for congestion and sore throat. Negative for ear pain.   Respiratory:  Negative for cough and shortness of breath.   Cardiovascular:  Negative for chest pain and palpitations.  Gastrointestinal:  Negative for abdominal pain, diarrhea and vomiting.  Musculoskeletal:  Negative for back pain.  Skin:  Negative for color change and rash.  Neurological:  Positive for headaches. Negative for syncope.  All other systems reviewed and are negative.   Physical Exam Triage Vital Signs ED Triage Vitals  Enc Vitals Group     BP      Pulse      Resp      Temp  Temp src      SpO2      Weight      Height      Head Circumference      Peak Flow      Pain Score      Pain Loc      Pain Edu?      Excl. in GC?    No data found.  Updated Vital Signs BP (!) 149/99 (BP Location: Left Arm)   Pulse 81   Temp 97.8 F (36.6 C) (Oral)   Resp 18   LMP 03/29/2021 (Approximate)   SpO2 95%   Visual Acuity Right Eye Distance:   Left Eye Distance:   Bilateral Distance:    Right Eye Near:   Left Eye Near:    Bilateral Near:     Physical Exam Vitals and nursing note reviewed.  Constitutional:      General: She is not in acute distress.    Appearance: She is well-developed. She is obese. She is not ill-appearing.  HENT:     Head: Normocephalic and atraumatic.     Right Ear: Tympanic membrane  normal.     Left Ear: Tympanic membrane normal.     Nose: Nose normal.     Mouth/Throat:     Mouth: Mucous membranes are moist.     Pharynx: Oropharynx is clear.  Eyes:     Conjunctiva/sclera: Conjunctivae normal.  Cardiovascular:     Rate and Rhythm: Normal rate and regular rhythm.     Heart sounds: Normal heart sounds.  Pulmonary:     Effort: Pulmonary effort is normal. No respiratory distress.     Breath sounds: Normal breath sounds. No wheezing.  Abdominal:     Palpations: Abdomen is soft.     Tenderness: There is no abdominal tenderness.  Musculoskeletal:     Cervical back: Neck supple.  Skin:    General: Skin is warm and dry.  Neurological:     General: No focal deficit present.     Mental Status: She is alert and oriented to person, place, and time.     Gait: Gait normal.  Psychiatric:        Mood and Affect: Mood normal.        Behavior: Behavior normal.     UC Treatments / Results  Labs (all labs ordered are listed, but only abnormal results are displayed) Labs Reviewed  NOVEL CORONAVIRUS, NAA    EKG   Radiology No results found.  Procedures Procedures (including critical care time)  Medications Ordered in UC Medications - No data to display  Initial Impression / Assessment and Plan / UC Course  I have reviewed the triage vital signs and the nursing notes.  Pertinent labs & imaging results that were available during my care of the patient were reviewed by me and considered in my medical decision making (see chart for details).  Viral URI.  Elevated blood pressure with hypertension. COVID pending.  Instructed patient to self quarantine per CDC guidelines.  Discussed symptomatic treatment including Tylenol or ibuprofen, rest, hydration.  Instructed patient to follow up with PCP if symptoms are not improving.  Also discussed that her blood pressure is elevated today and needs to be rechecked by her PCP in 2 to 4 weeks.  Education provided on managing  hypertension.  Patient agrees to plan of care.    Final Clinical Impressions(s) / UC Diagnoses   Final diagnoses:  Viral URI  Elevated blood pressure reading in office  with diagnosis of hypertension     Discharge Instructions      Your COVID test is pending.  You should self quarantine until the test result is back.    Take the prescribed Tessalon as needed for cough.  Take Tylenol or ibuprofen as needed for fever or discomfort.  Rest and keep yourself hydrated.    Follow-up with your primary care provider if your symptoms are not improving.    Your blood pressure is elevated today at 149/99.  Please have this rechecked by your primary care provider in 2-4 weeks.            ED Prescriptions     Medication Sig Dispense Auth. Provider   benzonatate (TESSALON) 100 MG capsule Take 1 capsule (100 mg total) by mouth 3 (three) times daily as needed for cough. 21 capsule Mickie Bail, NP      PDMP not reviewed this encounter.   Mickie Bail, NP 06/01/21 1451

## 2021-06-01 NOTE — ED Triage Notes (Signed)
Pt here with congestion, fatigue, headache, and sore throat x 2 days.

## 2021-06-02 LAB — SARS-COV-2, NAA 2 DAY TAT

## 2021-06-02 LAB — NOVEL CORONAVIRUS, NAA: SARS-CoV-2, NAA: NOT DETECTED

## 2021-06-21 ENCOUNTER — Ambulatory Visit: Payer: BC Managed Care – PPO | Admitting: Podiatry

## 2021-07-24 ENCOUNTER — Ambulatory Visit: Payer: BC Managed Care – PPO | Admitting: Podiatry

## 2021-08-08 ENCOUNTER — Ambulatory Visit: Payer: BC Managed Care – PPO | Admitting: Primary Care

## 2021-09-02 ENCOUNTER — Other Ambulatory Visit: Payer: Self-pay

## 2021-09-02 ENCOUNTER — Emergency Department (HOSPITAL_BASED_OUTPATIENT_CLINIC_OR_DEPARTMENT_OTHER): Payer: BC Managed Care – PPO | Admitting: Radiology

## 2021-09-02 ENCOUNTER — Encounter (HOSPITAL_BASED_OUTPATIENT_CLINIC_OR_DEPARTMENT_OTHER): Payer: Self-pay

## 2021-09-02 ENCOUNTER — Emergency Department (HOSPITAL_BASED_OUTPATIENT_CLINIC_OR_DEPARTMENT_OTHER)
Admission: EM | Admit: 2021-09-02 | Discharge: 2021-09-02 | Disposition: A | Discharge status: Home / Self Care | Payer: BC Managed Care – PPO | Attending: Emergency Medicine | Admitting: Emergency Medicine

## 2021-09-02 DIAGNOSIS — R059 Cough, unspecified: Secondary | ICD-10-CM | POA: Diagnosis not present

## 2021-09-02 DIAGNOSIS — U071 COVID-19: Secondary | ICD-10-CM | POA: Diagnosis not present

## 2021-09-02 DIAGNOSIS — I10 Essential (primary) hypertension: Secondary | ICD-10-CM | POA: Diagnosis not present

## 2021-09-02 DIAGNOSIS — Z79899 Other long term (current) drug therapy: Secondary | ICD-10-CM | POA: Insufficient documentation

## 2021-09-02 DIAGNOSIS — R079 Chest pain, unspecified: Secondary | ICD-10-CM | POA: Diagnosis not present

## 2021-09-02 DIAGNOSIS — R0789 Other chest pain: Secondary | ICD-10-CM | POA: Diagnosis not present

## 2021-09-02 DIAGNOSIS — J45909 Unspecified asthma, uncomplicated: Secondary | ICD-10-CM | POA: Diagnosis not present

## 2021-09-02 DIAGNOSIS — Z7951 Long term (current) use of inhaled steroids: Secondary | ICD-10-CM | POA: Insufficient documentation

## 2021-09-02 LAB — RESP PANEL BY RT-PCR (FLU A&B, COVID) ARPGX2
Influenza A by PCR: NEGATIVE
Influenza B by PCR: NEGATIVE
SARS Coronavirus 2 by RT PCR: POSITIVE — AB

## 2021-09-02 MED ORDER — BENZONATATE 100 MG PO CAPS: 100.0000 mg | ORAL_CAPSULE | Freq: Three times a day (TID) | ORAL | 0 refills | Status: DC | PRN

## 2021-09-02 NOTE — ED Provider Notes (Signed)
[facility] EMERGENCY DEPT Provider Note   CSN: [id number] Arrival date & time: 09/02/21  1009     History Chief Complaint  Patient presents with   Flu sx    Karina Neal is a 50 y.o. female.  HPI  50 year old female presenting to the emergency department with URI symptoms.  The patient states that for the last 3 days, she has had a cough, flulike illness with pleuritic chest discomfort that only occurs while coughing.  She denies any active shortness of breath.  She endorses diffuse body aches.  No known sick contacts.  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     Family History  Problem Relation Age of Onset   Hyperlipidemia Mother    Hypertension Mother    Hyperlipidemia Father    Hypertension Father    Diabetes Father    Hypertension Sister    Hypertension Brother     Social History   Tobacco Use   Smoking status: Never   Smokeless tobacco: Never  Substance Use Topics   Alcohol use: Yes    Comment: social   Drug use: No    Home Medications Prior to Admission medications   Medication Sig Start Date End Date Taking? Authorizing Provider  benzonatate (TESSALON) 100 MG capsule Take 1 capsule (100 mg total) by mouth 3 (three) times daily as needed for cough. 09/02/21   Ernie Avena, MD  fluticasone (FLONASE) 50 MCG/ACT nasal spray PLACE 1 SPRAY INTO BOTH  NOSTRILS 2 (TWO) TIMES DAILY 01/30/21   Doreene Nest, NP  hydrochlorothiazide (HYDRODIURIL) 12.5 MG tablet Take 1 tablet (12.5 mg total) by mouth daily. For blood pressure. 03/27/21   Doreene Nest, NP  naproxen (NAPROSYN) 500 MG tablet Take 500 mg by mouth 2 (two) times daily. 03/24/21   [provider]    Allergies    Shellfish allergy and Penicillins  Review of Systems   Review of Systems  Constitutional:  Positive for chills. Negative for fever.  HENT:  Negative for ear pain and sore throat.   Eyes:  Negative for pain and visual disturbance.  Respiratory:  Positive for cough. Negative for shortness of breath.   Cardiovascular:  Positive for chest pain. Negative for palpitations.  Gastrointestinal:  Negative for abdominal pain and vomiting.  Genitourinary:  Negative for dysuria and hematuria.  Musculoskeletal:  Positive for myalgias. Negative for arthralgias and back pain.  Skin:  Negative for color change and rash.  Neurological:  Negative for seizures and syncope.  All other systems reviewed and are negative.  Physical Exam Updated Vital Signs BP 126/66 (BP Location: Right Arm)    Pulse 80    Temp 98.2 F (36.8 C) (Oral)    Resp 18    Ht 5\' 4"  (  1.626 m)    Wt 98.4 kg    LMP  (LMP Unknown)    SpO2 98%    BMI 37.25 kg/m   Physical Exam  ED Results / Procedures / Treatments   Labs (all labs ordered are listed, but only abnormal results are displayed) Labs Reviewed  RESP PANEL BY RT-PCR (FLU A&B, COVID) ARPGX2 - Abnormal; Notable for the following components:      Result Value   SARS Coronavirus 2 by RT PCR POSITIVE (*)    All other components within normal limits    EKG EKG Interpretation  Date/Time:  Sunday September 02 2021 10:20:18 EST Ventricular Rate:  77 PR Interval:  158 QRS Duration: 84 QT Interval:  384 QTC Calculation: 434 R Axis:   68 Text Interpretation: Normal sinus rhythm Low voltage QRS T wave abnormality, consider inferior ischemia T  wave abnormality, consider anterolateral ischemia Abnormal ECG Confirmed by Corbyn Steedman ([id number]) on 09/02/2021 10:27:50 AM  Radiology DG Chest 2 View  Result Date: 09/02/2021 CLINICAL DATA:  Cough and flu symptoms for 3 days.  Chest pain. EXAM: CHEST - 2 VIEW COMPARISON:  11/15/2017 FINDINGS: The heart size and mediastinal contours are within normal limits. Both lungs are clear. The visualized skeletal structures are unremarkable. IMPRESSION: No active cardiopulmonary disease. Electronically Signed   By: Kyle  Talbot M.D.   On: 09/02/2021 11:22    Procedures Procedures   Medications Ordered in ED Medications - No data to display  ED Course  I have reviewed the triage vital signs and the nursing notes.  Pertinent labs & imaging results that were available during my care of the patient were reviewed by me and considered in my medical decision making (see chart for details).  Clinical Course as of 09/03/21 2229  Sun Sep 02, 2021  1151 SARS Coronavirus 2 by RT PCR(!): POSITIVE [JL]    Clinical Course User Index [JL] Jaeliana Lococo, MD   MDM Rules/Calculators/A&P                          50  year old female presenting to the emergency department with URI symptoms.  The patient states that for the last 3 days, she has had a cough, flulike illness with pleuritic chest discomfort that only occurs while coughing.  She denies any active shortness of breath.  She endorses diffuse body aches.  No known sick contacts.  On arrival, the patient was afebrile, hemodynamically stable, saturating well on room air.  The patient presents with a viral respiratory illness with some pleuritic chest discomfort.  She tested positive by PCR for COVID-19.  She was ambulatory in the emergency department with O2 saturations ranging between 95 to 98%.  No hypoxic respiratory failure and no oxygen requirement to indicate hospital admission.  EKG was performed which revealed nonspecific T wave changes.  The patient is  not having active chest pain at this time.  Pleuritic chest discomfort likely due to her COVID-19.  Chest x-ray was performed which revealed no active cardiopulmonary disease.  Patient was offered Paxlovid but declined.  We will treat symptomatically with Tessalon Perles.  Return precautions provided.  Stable for discharge at this time.   Final Clinical Impression(s) / ED Diagnoses Final diagnoses:  [id number]    Rx / DC Orders ED Discharge Orders          Ordered    benzonatate (TESSALON) 100 MG capsule  3 times daily PRN  09/02/21 1328             Regan Lemming, MD 09/03/21 2229

## 2021-09-02 NOTE — ED Triage Notes (Signed)
She c/o cough/flu sx x 3 days. She c/o chest pain only while coughing.

## 2021-09-02 NOTE — ED Notes (Signed)
Ambulated pt with pulse O2. Pt stated that she felt a little SHOB. Pt's O2 range between 95%-98%. Pt's pulse 100-102.

## 2021-09-02 NOTE — Discharge Instructions (Addendum)
You were evaluated in the Emergency Department and after careful evaluation, we did not find any emergent condition requiring admission or further testing in the hospital.  Your exam/testing today was overall reassuring.  Your PCR testing was positive for COVID-19.  Make sure you isolate at home for the next 5 days.  Ensure you are drinking plenty of fluids to stay hydrated, take Tylenol and ibuprofen for pain and fever.  Please return to the Emergency Department if you experience any worsening of your condition.  Thank you for allowing [place] to be a part of your care.

## 2021-09-02 NOTE — ED Notes (Signed)
Pt discharged home after verbalizing understanding of discharge instructions; nad noted. 

## 2021-10-01 NOTE — Telephone Encounter (Signed)
Received call from patent she is not having any red words. Cough is only bad at night. I have made virtual for her to get evaluation tomorrow.

## 2021-10-01 NOTE — Telephone Encounter (Signed)
Unable to speak with pt; no answer and no v/m; pt had + covid test on 09/02/22 at [facility] ED. Per my chart note pt was given tessalon but has not helped and pt thinks cough is worse. I spoke with pts husband (DPR signed) and Yaakov Guthrie said that he would have pt call [facility]. Sending note to [facility] CMA and [name] NP.

## 2021-10-02 ENCOUNTER — Telehealth (INDEPENDENT_AMBULATORY_CARE_PROVIDER_SITE_OTHER): Payer: BC Managed Care – PPO | Admitting: Primary Care

## 2021-10-02 ENCOUNTER — Other Ambulatory Visit: Payer: Self-pay

## 2021-10-02 DIAGNOSIS — K219 Gastro-esophageal reflux disease without esophagitis: Secondary | ICD-10-CM

## 2021-10-02 DIAGNOSIS — R051 Acute cough: Secondary | ICD-10-CM

## 2021-10-02 MED ORDER — OMEPRAZOLE 20 MG PO CPDR: 40.0000 mg | DELAYED_RELEASE_CAPSULE | Freq: Every evening | ORAL | 0 refills | Status: DC

## 2021-10-02 NOTE — Assessment & Plan Note (Signed)
Sounds to be reflux induced cough secondary to recent Covid-19 infection. She appears healthy and well today. No cough or distress noted.   Trial of omeprazole 20 mg sent to pharmacy. Will have her take 40 mg HS x 2-3 weeks with plans on weaning down to 20 mg and eventually off.   She will update in 1-2 weeks.

## 2021-10-02 NOTE — Patient Instructions (Signed)
Start omeprazole 20 mg for cough and heartburn. Take 2 capsules by mouth every evening for about 2-3 weeks.   Update me in 2 weeks, or sooner if your cough hasn't improved at all.  It was a pleasure to see you today!

## 2021-10-02 NOTE — Progress Notes (Signed)
Patient ID: Karina Neal, female    DOB: 1971-04-29, 51 y.o.   MRN: [id number]  Virtual visit completed through [employer], a video enabled telemedicine application. Due to national recommendations of social distancing due to COVID-19, a virtual visit is felt to be most appropriate for this patient at this time. Reviewed limitations, risks, security and privacy concerns of performing a virtual visit and the availability of in person appointments. I also reviewed that there may be a patient responsible charge related to this service. The patient agreed to proceed.   Patient location: home Provider location: [place] at [facility], office Persons participating in this virtual visit: patient, provider   If any vitals were documented, they were collected by patient at home unless specified below.    LMP  (LMP Unknown)    CC: Cough Subjective:   HPI: Karina Neal is a 51 y.o. female presenting on 10/02/2021 for No chief complaint on file.  Evaluated at [facility] Emergency Department on 09/02/21 for a three day history of cough, chills, body aches with pleuritic chest discomfort with cough. She tested positive for Covid-19, negative for influenza. Oxygen saturation was 95%-98% on RA, she showed no signs of acute hypoxia. She was offered treatment with Paxlovid but she kindly declined. She was prescribed Tessalon Perles. She was discharged home later that day.  Today she endorses all symptoms have resolved except for her cough. Her cough is "wet". Her cough occurs during the afternoon and lasts throughout the night. Cough is mostly worse when initially laying down at night and while she's sleeping. Her husband has noticed her cough during the night.   She denies fevers, chest congestion, shortness of breath. She's seen no improvement with Tessalon perles.   She does experience esophageal burning, belching every other day, this began about two weeks ago.         Relevant past medical,  surgical, family and social history reviewed and updated as indicated. Interim medical history since our last visit reviewed. Allergies and medications reviewed and updated. Outpatient Medications Prior to Visit  Medication Sig Dispense Refill   benzonatate (TESSALON) 100 MG capsule Take 1 capsule (100 mg total) by mouth 3 (three) times daily as needed for cough. 21 capsule 0   fluticasone (FLONASE) 50 MCG/ACT nasal spray PLACE 1 SPRAY INTO BOTH NOSTRILS 2 (TWO) TIMES DAILY 16 mL 0   hydrochlorothiazide (HYDRODIURIL) 12.5 MG tablet Take 1 tablet (12.5 mg total) by mouth daily. For blood pressure. 90 tablet 3   naproxen (NAPROSYN) 500 MG tablet Take 500 mg by mouth 2 (two) times daily.     No facility-administered medications prior to visit.     Per HPI unless specifically indicated in ROS section below Review of Systems  Constitutional:  Negative for chills, fatigue and fever.  HENT:  Negative for congestion, postnasal drip and sore throat.   Respiratory:  Positive for cough. Negative for shortness of breath.   Gastrointestinal:        Esophageal burning and belching  Objective:  LMP  (LMP Unknown)   Wt Readings from Last 3 Encounters:  09/02/21 217 lb (98.4 kg)  05/10/21 222 lb (100.7 kg)  03/27/21 222 lb (100.7 kg)       Physical exam: General: Alert and oriented x 3, no distress, does not appear sickly  Pulmonary: Speaks in complete sentences without increased work of breathing, no cough during visit.  Psychiatric: Normal mood, thought content, and behavior.     Results for orders placed  or performed during the hospital encounter of 09/02/21  Resp Panel by RT-PCR (Flu A&B, Covid) Nasopharyngeal Swab   Specimen: Nasopharyngeal Swab; Nasopharyngeal(NP) swabs in vial transport medium  Result Value Ref Range   SARS Coronavirus 2 by RT PCR POSITIVE (A) NEGATIVE   Influenza A by PCR NEGATIVE NEGATIVE   Influenza B by PCR NEGATIVE NEGATIVE   Assessment & Plan:   Problem List  Items Addressed This Visit       Other   Acute cough - Primary    Sounds to be reflux induced cough secondary to recent Covid-19 infection. She appears healthy and well today. No cough or distress noted.   Trial of omeprazole 20 mg sent to pharmacy. Will have her take 40 mg HS x 2-3 weeks with plans on weaning down to 20 mg and eventually off.   She will update in 1-2 weeks.       Relevant Medications   omeprazole (PRILOSEC) 20 MG capsule   Other Visit Diagnoses     Gastroesophageal reflux disease, unspecified whether esophagitis present       Relevant Medications   omeprazole (PRILOSEC) 20 MG capsule        Meds ordered this encounter  Medications   omeprazole (PRILOSEC) 20 MG capsule    Sig: Take 2 capsules (40 mg total) by mouth every evening. For cough.    Dispense:  60 capsule    Refill:  0    Order Specific Question:   Supervising Provider    Answer:   BEDSOLE, AMY E [[id number]]   No orders of the defined types were placed in this encounter.   I discussed the assessment and treatment plan with the patient. The patient was provided an opportunity to ask questions and all were answered. The patient agreed with the plan and demonstrated an understanding of the instructions. The patient was advised to call back or seek an in-person evaluation if the symptoms worsen or if the condition fails to improve as anticipated.  Follow up plan:  Start omeprazole 20 mg for cough and heartburn. Take 2 capsules by mouth every evening for about 2-3 weeks.   Update me in 2 weeks, or sooner if your cough hasn't improved at all.  It was a pleasure to see you today!   Doreene Nest, NP

## 2021-10-11 DIAGNOSIS — Z91013 Allergy to seafood: Secondary | ICD-10-CM | POA: Diagnosis not present

## 2021-10-11 DIAGNOSIS — M21062 Valgus deformity, not elsewhere classified, left knee: Secondary | ICD-10-CM | POA: Diagnosis not present

## 2021-10-11 DIAGNOSIS — M25562 Pain in left knee: Secondary | ICD-10-CM | POA: Diagnosis not present

## 2021-10-11 DIAGNOSIS — M21162 Varus deformity, not elsewhere classified, left knee: Secondary | ICD-10-CM | POA: Diagnosis not present

## 2021-10-11 DIAGNOSIS — Z6835 Body mass index (BMI) 35.0-35.9, adult: Secondary | ICD-10-CM | POA: Diagnosis not present

## 2021-10-11 DIAGNOSIS — W010XXA Fall on same level from slipping, tripping and stumbling without subsequent striking against object, initial encounter: Secondary | ICD-10-CM | POA: Diagnosis not present

## 2021-10-11 DIAGNOSIS — I10 Essential (primary) hypertension: Secondary | ICD-10-CM | POA: Diagnosis not present

## 2021-10-11 DIAGNOSIS — M25571 Pain in right ankle and joints of right foot: Secondary | ICD-10-CM | POA: Diagnosis not present

## 2021-10-12 ENCOUNTER — Encounter (INDEPENDENT_AMBULATORY_CARE_PROVIDER_SITE_OTHER): Payer: Self-pay

## 2021-10-19 ENCOUNTER — Other Ambulatory Visit: Payer: Self-pay | Admitting: Primary Care

## 2021-10-19 DIAGNOSIS — K219 Gastro-esophageal reflux disease without esophagitis: Secondary | ICD-10-CM

## 2021-10-19 DIAGNOSIS — R051 Acute cough: Secondary | ICD-10-CM

## 2021-10-30 DIAGNOSIS — S8992XA Unspecified injury of left lower leg, initial encounter: Secondary | ICD-10-CM | POA: Diagnosis not present

## 2021-10-30 DIAGNOSIS — M25562 Pain in left knee: Secondary | ICD-10-CM | POA: Diagnosis not present

## 2021-11-09 DIAGNOSIS — X58XXXA Exposure to other specified factors, initial encounter: Secondary | ICD-10-CM | POA: Diagnosis not present

## 2021-11-09 DIAGNOSIS — S8992XA Unspecified injury of left lower leg, initial encounter: Secondary | ICD-10-CM | POA: Diagnosis not present

## 2021-11-09 DIAGNOSIS — M25562 Pain in left knee: Secondary | ICD-10-CM | POA: Diagnosis not present

## 2021-11-09 DIAGNOSIS — S83412A Sprain of medial collateral ligament of left knee, initial encounter: Secondary | ICD-10-CM | POA: Diagnosis not present

## 2021-11-12 ENCOUNTER — Other Ambulatory Visit: Payer: Self-pay | Admitting: Primary Care

## 2021-11-12 DIAGNOSIS — K219 Gastro-esophageal reflux disease without esophagitis: Secondary | ICD-10-CM

## 2021-11-12 DIAGNOSIS — R051 Acute cough: Secondary | ICD-10-CM

## 2021-11-12 NOTE — Telephone Encounter (Signed)
Received refill request for omeprazole that was prescribed in late January 2023 for her cough. ? ?Is she still taking omeprazole? ?How many times daily? ? ?Is her cough better/resolved? ? ?Would like to wean her off at some point. ?

## 2021-11-14 NOTE — Telephone Encounter (Signed)
Left message to return call to our office.  

## 2021-11-19 NOTE — Telephone Encounter (Signed)
3rd attempt to call patient left message to call office.  ?

## 2021-11-19 NOTE — Telephone Encounter (Signed)
So it sounds like the cough is better until she stops the omeprazole. ?I would like to meet with her to discuss in about 3 weeks. I sent her another month refill for 2 capsules nightly.  ? ?We can meet in person or virtually.  ?

## 2021-11-19 NOTE — Telephone Encounter (Signed)
Read info to patient as written below: ? ?Received refill request for omeprazole that was prescribed in late January 2023 for her cough. ?  ?Is she still taking omeprazole?  Yes ?How many times daily? 2 times daily  ?  ?Is her cough better/resolved? No, patient states if she stops the cough starts back ?  ?Would like to wean her off at some point. ? ?Informed patient that someone would follow-up ?

## 2021-11-21 DIAGNOSIS — S83412D Sprain of medial collateral ligament of left knee, subsequent encounter: Secondary | ICD-10-CM | POA: Diagnosis not present

## 2021-11-23 NOTE — Telephone Encounter (Signed)
Called patient appointment  made for 3 weeks.  ?

## 2021-12-18 ENCOUNTER — Encounter: Payer: Self-pay | Admitting: Primary Care

## 2021-12-18 ENCOUNTER — Ambulatory Visit (INDEPENDENT_AMBULATORY_CARE_PROVIDER_SITE_OTHER): Payer: Self-pay | Admitting: Primary Care

## 2021-12-18 DIAGNOSIS — K219 Gastro-esophageal reflux disease without esophagitis: Secondary | ICD-10-CM

## 2021-12-18 DIAGNOSIS — R051 Acute cough: Secondary | ICD-10-CM

## 2021-12-18 DIAGNOSIS — E669 Obesity, unspecified: Secondary | ICD-10-CM

## 2021-12-18 MED ORDER — OMEPRAZOLE 20 MG PO CPDR: 20.0000 mg | DELAYED_RELEASE_CAPSULE | Freq: Every evening | ORAL | 0 refills | Status: DC

## 2021-12-18 NOTE — Assessment & Plan Note (Signed)
Chronic GERD induced cough. ? ?Discussed goals of weaning entirely off of omeprazole, but will work on this slowly as tolerated. ? ?Reduce omeprazole to 20 mg daily. ?We will see her later this summer for her physical, consider discontinuation at that time if applicable. ? ?She will update if her symptoms are intolerable on the 20 mg dose. ?

## 2021-12-18 NOTE — Progress Notes (Signed)
? ?Subjective:  ? ? Patient ID: Karina Neal, female    DOB: 1971-07-13, 51 y.o.   MRN: [id number] ? ?HPI ? ?Karina Neal is a very pleasant 51 y.o. female with a history of hypertension, migraines, CVA, chronic cough who presents today for follow-up of chronic cough. ? ?She was last evaluated on 10/02/2021 via telemedicine for persistent acute cough.  Previously, she was seen at [facility] ED for 3-day history of cough, chills, body aches.  She was positive for COVID-19, declined Paxlovid.  ? ?During our visit in January 2023 we initiated omeprazole 40 mg daily X 2-3 weeks for persistent cough, esophageal burning, belching. Plan was to reduce to 20 mg daily and wean from there. ? ?Today she endorses doing well with omeprazole and has been taking 40 mg daily. Her symptoms of heartburn and belching improved. She tried to stop omeprazole 40 mg and had to resume after two days due to increased cough.  ? ?She is working on weight loss, is frustrated as she's not seeing progress despite efforts. She is drinking a large sweet tea from [employer] twice daily.  She also enjoys eating rice and other starchy foods. ? ?BP Readings from Last 3 Encounters:  ?12/18/21 120/82  ?09/02/21 126/66  ?06/01/21 (!) 149/99  ? ? ? ? ? ?Review of Systems  ?Constitutional:  Negative for chills and fever.  ?Respiratory:  Positive for cough. Negative for shortness of breath.   ?Cardiovascular:  Negative for chest pain.  ? ?   ? ? ?Past Medical History:  ?Diagnosis Date  ? Asthma   ? Blood pressure elevated   ? Chicken pox   ? Frequent headaches   ? Hypertension   ? Left sided numbness 04/23/2015  ? ? ?Social History  ? ?Socioeconomic History  ? Marital status: Married  ?  Spouse name: Not on file  ? Number of children: Not on file  ? Years of education: Not on file  ? Highest education level: Not on file  ?Occupational History  ? Not on file  ?Tobacco Use  ? Smoking status: Never  ? Smokeless tobacco: Never  ?Substance and Sexual Activity  ?  Alcohol use: Yes  ?  Comment: social  ? Drug use: No  ? Sexual activity: Not on file  ?Other Topics Concern  ? Not on file  ?Social History Narrative  ? ** Merged History Encounter **  ?    ? ?Social Determinants of Health  ? ?Financial Resource Strain: Not on file  ?Food Insecurity: Not on file  ?Transportation Needs: Not on file  ?Physical Activity: Not on file  ?Stress: Not on file  ?Social Connections: Not on file  ?Intimate Partner Violence: Not on file  ? ? ?Past Surgical History:  ?Procedure Laterality Date  ? APPENDECTOMY    ? CESAREAN SECTION    ? CESAREAN SECTION    ? EYE SURGERY    ? ? ?Family History  ?Problem Relation Age of Onset  ? Hyperlipidemia Mother   ? Hypertension Mother   ? Hyperlipidemia Father   ? Hypertension Father   ? Diabetes Father   ? Hypertension Sister   ? Hypertension Brother   ? ? ?Allergies  ?Allergen Reactions  ? Shellfish Allergy Anaphylaxis  ?  Throat swelling, dyspnea  ? Penicillins Hives  ?  Has patient had a PCN reaction causing immediate rash, facial/tongue/throat swelling, SOB or lightheadedness with hypotension: Unknown ?Has patient had a PCN reaction causing severe rash involving mucus membranes or skin  necrosis: No ?Has patient had a PCN reaction that required hospitalization: No ?Has patient had a PCN reaction occurring within the last 10 years: No ?If all of the above answers are "NO", then may proceed with Cephalosporin use. ?  ? ? ?Current Outpatient Medications on File Prior to Visit  ?Medication Sig Dispense Refill  ? fluticasone (FLONASE) 50 MCG/ACT nasal spray PLACE 1 SPRAY INTO BOTH NOSTRILS 2 (TWO) TIMES DAILY 16 mL 0  ? hydrochlorothiazide (HYDRODIURIL) 12.5 MG tablet Take 1 tablet (12.5 mg total) by mouth daily. For blood pressure. 90 tablet 3  ? naproxen (NAPROSYN) 500 MG tablet Take 500 mg by mouth 2 (two) times daily.    ? omeprazole (PRILOSEC) 20 MG capsule TAKE 2 CAPSULES (40 MG TOTAL) BY MOUTH EVERY EVENING. FOR COUGH. 60 capsule 0  ? ?No current  facility-administered medications on file prior to visit.  ? ? ?BP 120/82   Pulse 97   Ht 5\' 4"  (1.626 m)   Wt 220 lb (99.8 kg)   SpO2 97%   BMI 37.76 kg/m?  ?Objective:  ? Physical Exam ?Cardiovascular:  ?   Rate and Rhythm: Normal rate and regular rhythm.  ?Pulmonary:  ?   Effort: Pulmonary effort is normal.  ?   Breath sounds: Normal breath sounds.  ?Musculoskeletal:  ?   Cervical back: Neck supple.  ?Skin: ?   General: Skin is warm and dry.  ? ? ? ? ? ?   ?Assessment & Plan:  ? ? ? ? ?This visit occurred during the SARS-CoV-2 public health emergency.  Safety protocols were in place, including screening questions prior to the visit, additional usage of staff PPE, and extensive cleaning of exam room while observing appropriate contact time as indicated for disinfecting solutions.  ?

## 2021-12-18 NOTE — Assessment & Plan Note (Signed)
Strongly advise she cut out sweet tea and other sugary beverages. ? ?Also discussed to reduce starchy foods, fried foods for which she eats regularly. ? ?We will see her back this summer for follow-up. ?

## 2021-12-18 NOTE — Patient Instructions (Signed)
Reduce omeprazole to 20 mg daily for cough. ? ?Stop drinking sweet tea and other sugary beverages. ? ?Reduce consumption of rice, pasta, breads, sugary foods. ? ?Schedule your physical for late June/early July 2023. ? ?It was a pleasure to see you today! ? ?

## 2022-01-16 NOTE — Telephone Encounter (Signed)
Unable to reach pt by phone left v/m requesting cb for additional information. Per my chart note;Last 2 wks numbness in rt arm during the day and feels worse at night. Sending note to Allayne Gitelman NP, joellen CMA and [facility] triage. ?

## 2022-01-16 NOTE — Telephone Encounter (Signed)
Patient called back numbness and tingling from shoulder to all fingers. Increases when she is griping something. For about two weeks. Denies any neck pain or injury. Symptoms come and go but increase at night and when she goes to bed. Laying down makes it worse. Patient has tried heat but did not have any improvement. Denies any chest pain or shortness of breath. I have set up patient with [facility] tomorrow.   ?

## 2022-01-16 NOTE — Telephone Encounter (Signed)
Noted and agree with recommendation and visit with Susy Frizzle, NP ?

## 2022-01-17 ENCOUNTER — Ambulatory Visit (INDEPENDENT_AMBULATORY_CARE_PROVIDER_SITE_OTHER): Payer: BLUE CROSS/BLUE SHIELD | Admitting: Nurse Practitioner

## 2022-01-17 ENCOUNTER — Ambulatory Visit (INDEPENDENT_AMBULATORY_CARE_PROVIDER_SITE_OTHER)
Admission: RE | Admit: 2022-01-17 | Discharge: 2022-01-17 | Disposition: A | Discharge status: Home / Self Care | Payer: BLUE CROSS/BLUE SHIELD | Source: Ambulatory Visit | Attending: Nurse Practitioner | Admitting: Nurse Practitioner

## 2022-01-17 VITALS — BP 128/76 | HR 80 | Temp 96.9°F | Resp 14 | Ht 64.0 in | Wt 223.2 lb

## 2022-01-17 DIAGNOSIS — R202 Paresthesia of skin: Secondary | ICD-10-CM | POA: Insufficient documentation

## 2022-01-17 DIAGNOSIS — G479 Sleep disorder, unspecified: Secondary | ICD-10-CM | POA: Insufficient documentation

## 2022-01-17 DIAGNOSIS — R29898 Other symptoms and signs involving the musculoskeletal system: Secondary | ICD-10-CM

## 2022-01-17 DIAGNOSIS — R5383 Other fatigue: Secondary | ICD-10-CM | POA: Diagnosis not present

## 2022-01-17 DIAGNOSIS — R631 Polydipsia: Secondary | ICD-10-CM | POA: Insufficient documentation

## 2022-01-17 HISTORY — DX: Paresthesia of skin: R20.2

## 2022-01-17 LAB — POCT URINALYSIS DIPSTICK
Bilirubin, UA: NEGATIVE
Blood, UA: NEGATIVE
Glucose, UA: NEGATIVE
Ketones, UA: NEGATIVE
Leukocytes, UA: NEGATIVE
Nitrite, UA: NEGATIVE
Protein, UA: POSITIVE — AB
Spec Grav, UA: >=1.03 — AB (ref 1.010–1.025)
Urobilinogen, UA: 1 E.U./dL
pH, UA: 6 (ref 5.0–8.0)

## 2022-01-17 NOTE — Patient Instructions (Signed)
Nice to see you today ?I will be in touch with the lab results and xray once I have them ?Follow up if no improvement  ?

## 2022-01-17 NOTE — Assessment & Plan Note (Signed)
Given some objective weakness to right upper extremity and the paresthesias that are intermittent we will obtain cervical pictures.  If negative can consider advanced imaging or follow-up with orthopedist. ?

## 2022-01-17 NOTE — Assessment & Plan Note (Signed)
Overt fatigue numbness that started approximately 2 weeks ago.  Patient does get up very early for work at 3:15 AM.  Does not go to bed till 9 PM.  Patient also snores per patient and spouse's report.  If blood work comes back benign consider sleep study or rule out for endocrine disorder of the adrenal glands ?

## 2022-01-17 NOTE — Progress Notes (Signed)
? ?Acute Office Visit ? ?Subjective:  ? ?  ?Patient ID: Karina Neal, female    DOB: 1970/10/08, 51 y.o.   MRN: [id number] ? ?Chief Complaint  ?Patient presents with  ? Numbness  ?  X 2 weeks, numbness and tingling from right shoulder to all fingers. Increases when she is griping something. Denies any neck pain or injury. Symptoms come and go but increase at night and when she goes to bed. Laying down makes it worse. Nothing that makes it better that she is aware of. No back injury or falls. More fatigue the last 2 weeks and thirsty  ? ? ?HPI ?Patient is in today for  ?Syptoms approx 2 weeks ago. Intermittnet in nature. Worse with movement and activity. States that it will go numb with recombancy.  It does not matter if she lays on left side, right side, and back it will go numb.  Has not tried over-the-counter treatments.  This has not happened patient in the past.  No discrete injury and no history of neck injury, trauma, or surgery. ? ?Fatigue: approx started 2 weeks.  States goes to bed aroun 9pm-315am. Sometimes she feels rested sometimes and she does snore.  Feels cold a lot at home.  States that she does have a family history of diabetes.  Patient has had a modest weight increase since last office visit today in office was negative for glucose ? ?Review of Systems  ?Constitutional:  Positive for malaise/fatigue. Negative for chills and fever.  ?Gastrointestinal:  Negative for diarrhea, nausea and vomiting.  ?Genitourinary:  Negative for dysuria and hematuria.  ?Musculoskeletal:  Negative for neck pain.  ?Neurological:  Positive for tingling and weakness. Negative for dizziness and headaches.  ?Endo/Heme/Allergies:  Positive for polydipsia.  ?     Polyphasia positive  ? ? ?   ?Objective:  ?  ?BP 128/76   Pulse 80   Temp (!) 96.9 ?F (36.1 ?C)   Resp 14   Ht 5\' 4"  (1.626 m)   Wt 223 lb 4 oz (101.3 kg)   LMP 10/21/2021 Comment: get a period about 2 times a year  SpO2 95%   BMI 38.32 kg/m?  ?BP Readings  from Last 3 Encounters:  ?01/17/22 128/76  ?12/18/21 120/82  ?09/02/21 126/66  ? ?Wt Readings from Last 3 Encounters:  ?01/17/22 223 lb 4 oz (101.3 kg)  ?12/18/21 220 lb (99.8 kg)  ?09/02/21 217 lb (98.4 kg)  ? ?  ? ?Physical Exam ?Vitals and nursing note reviewed.  ?Constitutional:   ?   Appearance: Normal appearance.  ?HENT:  ?   Right Ear: Tympanic membrane, ear canal and external ear normal.  ?   Left Ear: Tympanic membrane, ear canal and external ear normal.  ?   Mouth/Throat:  ?   Mouth: Mucous membranes are moist.  ?   Pharynx: Oropharynx is clear.  ?Eyes:  ?   Extraocular Movements: Extraocular movements intact.  ?   Pupils: Pupils are equal, round, and reactive to light.  ?Cardiovascular:  ?   Rate and Rhythm: Normal rate and regular rhythm.  ?   Heart sounds: Normal heart sounds.  ?Pulmonary:  ?   Effort: Pulmonary effort is normal.  ?   Breath sounds: Normal breath sounds.  ?Musculoskeletal:     ?   General: No tenderness or signs of injury.  ?   Right shoulder: No tenderness, bony tenderness or crepitus. Normal range of motion. Normal strength. Normal pulse.  ?   Left  shoulder: Normal.  ?   Cervical back: No tenderness or bony tenderness.  ?Skin: ?   Comments: Acanthosis nigricans  ?Neurological:  ?   Mental Status: She is alert.  ?   Motor: Weakness present.  ?   Deep Tendon Reflexes: Reflexes normal.  ?   Reflex Scores: ?     Bicep reflexes are 2+ on the right side and 2+ on the left side. ?   Comments: Slight weakness appreicated on the RUE. Patinet has FROM on RUE. 2+  radial pluses bilaterally  ? ? ?Results for orders placed or performed in visit on 01/17/22  ?POCT urinalysis dipstick  ?Result Value Ref Range  ? Color, UA orange   ? Clarity, UA clear   ? Glucose, UA Negative Negative  ? Bilirubin, UA negative   ? Ketones, UA negative   ? Spec Grav, UA >=1.030 (A) 1.010 - 1.025  ? Blood, UA negative   ? pH, UA 6.0 5.0 - 8.0  ? Protein, UA Positive (A) Negative  ? Urobilinogen, UA 1.0 0.2 or 1.0  E.U./dL  ? Nitrite, UA negative   ? Leukocytes, UA Negative Negative  ? Appearance    ? Odor    ? ? ? ?   ?Assessment & Plan:  ? ?Problem List Items Addressed This Visit   ? ?  ? Other  ? Increased thirst - Primary  ?  UA negative for glucose in office.  Patient has not been diabetic in the past but has been prediabetic it seems.  Patient does have family history of diabetes pending A1c in office today.  If work-up is negative consider ruling out adrenal gland disorders ? ?  ?  ? Relevant Orders  ? POCT urinalysis dipstick (Completed)  ? Hemoglobin A1c  ? Other fatigue  ?  Overt fatigue numbness that started approximately 2 weeks ago.  Patient does get up very early for work at 3:15 AM.  Does not go to bed till 9 PM.  Patient also snores per patient and spouse's report.  If blood work comes back benign consider sleep study or rule out for endocrine disorder of the adrenal glands ? ?  ?  ? Relevant Orders  ? CBC  ? Vitamin B12  ? Comprehensive metabolic panel  ? TSH  ? VITAMIN D 25 Hydroxy (Vit-D Deficiency, Fractures)  ? Paresthesia  ?  States it starts from right shoulder all the way down the arm encompasses all fingers and hand to do not think this is related to a cubital or ulnar tunnel.  Likely neck etiology.  Shoulder exam benign in office pending cervical picture and blood work ? ?  ?  ? Relevant Orders  ? Vitamin B12  ? DG Cervical Spine Complete  ? RUE weakness  ?  Given some objective weakness to right upper extremity and the paresthesias that are intermittent we will obtain cervical pictures.  If negative can consider advanced imaging or follow-up with orthopedist. ? ?  ?  ? Relevant Orders  ? DG Cervical Spine Complete  ? ? ?No orders of the defined types were placed in this encounter. ? ? ?Return if symptoms worsen or fail to improve. ? ?Audria Nine, NP ? ? ?

## 2022-01-17 NOTE — Assessment & Plan Note (Signed)
States it starts from right shoulder all the way down the arm encompasses all fingers and hand to do not think this is related to a cubital or ulnar tunnel.  Likely neck etiology.  Shoulder exam benign in office pending cervical picture and blood work ?

## 2022-01-17 NOTE — Assessment & Plan Note (Signed)
UA negative for glucose in office.  Patient has not been diabetic in the past but has been prediabetic it seems.  Patient does have family history of diabetes pending A1c in office today.  If work-up is negative consider ruling out adrenal gland disorders ?

## 2022-01-18 LAB — VITAMIN D 25 HYDROXY (VIT D DEFICIENCY, FRACTURES): VITD: 7.64 ng/mL — ABNORMAL LOW (ref 30.00–100.00)

## 2022-01-18 LAB — COMPREHENSIVE METABOLIC PANEL
ALT: 20 U/L (ref 0–35)
AST: 18 U/L (ref 0–37)
Albumin: 4.4 g/dL (ref 3.5–5.2)
Alkaline Phosphatase: 50 U/L (ref 39–117)
BUN: 16 mg/dL (ref 6–23)
CO2: 32 mEq/L (ref 19–32)
Calcium: 9.3 mg/dL (ref 8.4–10.5)
Chloride: 102 mEq/L (ref 96–112)
Creatinine, Ser: 0.84 mg/dL (ref 0.40–1.20)
GFR: 81.23 mL/min (ref >60.00)
Glucose, Bld: 100 mg/dL — ABNORMAL HIGH (ref 70–99)
Potassium: 3.7 mEq/L (ref 3.5–5.1)
Sodium: 142 mEq/L (ref 135–145)
Total Bilirubin: 0.5 mg/dL (ref 0.2–1.2)
Total Protein: 7 g/dL (ref 6.0–8.3)

## 2022-01-18 LAB — HEMOGLOBIN A1C: Hgb A1c MFr Bld: 6 % (ref 4.6–6.5)

## 2022-01-18 LAB — TSH: TSH: 1.12 u[IU]/mL (ref 0.35–5.50)

## 2022-01-18 LAB — CBC
HCT: 39.9 % (ref 36.0–46.0)
Hemoglobin: 13.1 g/dL (ref 12.0–15.0)
MCHC: 32.8 g/dL (ref 30.0–36.0)
MCV: 89.9 fl (ref 78.0–100.0)
Platelets: 130 10*3/uL — ABNORMAL LOW (ref 150.0–400.0)
RBC: 4.43 Mil/uL (ref 3.87–5.11)
RDW: 13.8 % (ref 11.5–15.5)
WBC: 6.8 10*3/uL (ref 4.0–10.5)

## 2022-01-18 LAB — VITAMIN B12: Vitamin B-12: 186 pg/mL — ABNORMAL LOW (ref 211–911)

## 2022-01-21 ENCOUNTER — Other Ambulatory Visit: Payer: Self-pay | Admitting: Nurse Practitioner

## 2022-01-21 DIAGNOSIS — E559 Vitamin D deficiency, unspecified: Secondary | ICD-10-CM

## 2022-01-21 DIAGNOSIS — E538 Deficiency of other specified B group vitamins: Secondary | ICD-10-CM

## 2022-01-21 MED ORDER — VITAMIN D (ERGOCALCIFEROL) 1.25 MG (50000 UNIT) PO CAPS: ORAL_CAPSULE | ORAL | 0 refills | Status: DC

## 2022-01-22 ENCOUNTER — Telehealth: Payer: Self-pay | Admitting: Nurse Practitioner

## 2022-01-22 MED ORDER — PREDNISONE 20 MG PO TABS: ORAL_TABLET | ORAL | 0 refills | Status: AC

## 2022-01-22 NOTE — Telephone Encounter (Signed)
Patient advised.

## 2022-01-22 NOTE — Telephone Encounter (Signed)
Left message to call back  

## 2022-01-22 NOTE — Telephone Encounter (Signed)
-----   Message from [facility], [place] sent at 01/21/2022  4:35 PM EDT ----- ?Patient advised. Patient would like to try the steroid and please send to [facility] [place] pharmacy. Lab appointment scheduled for 02/21/22 ?

## 2022-01-22 NOTE — Addendum Note (Signed)
Addended by: Eden Emms on: 01/22/2022 07:34 AM ? ? Modules accepted: Orders ? ?

## 2022-01-22 NOTE — Telephone Encounter (Signed)
Steroids sent in she needs to hold the naproxen and meloxicam while on this medication ?

## 2022-01-28 ENCOUNTER — Ambulatory Visit: Payer: BLUE CROSS/BLUE SHIELD

## 2022-02-10 ENCOUNTER — Other Ambulatory Visit: Payer: Self-pay | Admitting: Nurse Practitioner

## 2022-02-10 DIAGNOSIS — E559 Vitamin D deficiency, unspecified: Secondary | ICD-10-CM

## 2022-02-21 ENCOUNTER — Other Ambulatory Visit (INDEPENDENT_AMBULATORY_CARE_PROVIDER_SITE_OTHER): Payer: BLUE CROSS/BLUE SHIELD

## 2022-02-21 DIAGNOSIS — E559 Vitamin D deficiency, unspecified: Secondary | ICD-10-CM

## 2022-02-21 DIAGNOSIS — E538 Deficiency of other specified B group vitamins: Secondary | ICD-10-CM

## 2022-02-21 LAB — VITAMIN B12: Vitamin B-12: 508 pg/mL (ref 211–911)

## 2022-02-21 LAB — VITAMIN D 25 HYDROXY (VIT D DEFICIENCY, FRACTURES): VITD: 32.37 ng/mL (ref 30.00–100.00)

## 2022-03-05 ENCOUNTER — Ambulatory Visit (INDEPENDENT_AMBULATORY_CARE_PROVIDER_SITE_OTHER): Payer: BC Managed Care – PPO | Admitting: Primary Care

## 2022-03-05 ENCOUNTER — Encounter: Payer: Self-pay | Admitting: Primary Care

## 2022-03-05 VITALS — BP 104/68 | HR 86 | Temp 98.6°F | Ht 64.0 in | Wt 222.0 lb

## 2022-03-05 DIAGNOSIS — Z1231 Encounter for screening mammogram for malignant neoplasm of breast: Secondary | ICD-10-CM | POA: Diagnosis not present

## 2022-03-05 DIAGNOSIS — M79673 Pain in unspecified foot: Secondary | ICD-10-CM | POA: Diagnosis not present

## 2022-03-05 DIAGNOSIS — G43709 Chronic migraine without aura, not intractable, without status migrainosus: Secondary | ICD-10-CM

## 2022-03-05 DIAGNOSIS — Z0000 Encounter for general adult medical examination without abnormal findings: Secondary | ICD-10-CM | POA: Diagnosis not present

## 2022-03-05 DIAGNOSIS — E559 Vitamin D deficiency, unspecified: Secondary | ICD-10-CM

## 2022-03-05 DIAGNOSIS — R7303 Prediabetes: Secondary | ICD-10-CM

## 2022-03-05 DIAGNOSIS — I10 Essential (primary) hypertension: Secondary | ICD-10-CM

## 2022-03-05 DIAGNOSIS — R202 Paresthesia of skin: Secondary | ICD-10-CM

## 2022-03-05 DIAGNOSIS — K219 Gastro-esophageal reflux disease without esophagitis: Secondary | ICD-10-CM

## 2022-03-05 DIAGNOSIS — Z1211 Encounter for screening for malignant neoplasm of colon: Secondary | ICD-10-CM | POA: Diagnosis not present

## 2022-03-05 DIAGNOSIS — G8929 Other chronic pain: Secondary | ICD-10-CM

## 2022-03-05 DIAGNOSIS — Z23 Encounter for immunization: Secondary | ICD-10-CM

## 2022-03-05 NOTE — Assessment & Plan Note (Signed)
Slight improvement since replenishment of vitamin B12. Do suspect underlying carpal tunnel given her repetitive work.  Offered EMG testing for which she currently declines at this time.  Continue oral B12 daily, repeat B12 level in 3 months.

## 2022-03-05 NOTE — Assessment & Plan Note (Signed)
First Shingrix vaccine due and provided today.  Other vaccines up-to-date. Pap smear up-to-date. Colonoscopy due, referral placed to GI. Mammogram overdue, orders placed.  Discussed the importance of a healthy diet and regular exercise in order for weight loss, and to reduce the risk of further co-morbidity.  Exam today stable Labs reviewed.

## 2022-03-05 NOTE — Assessment & Plan Note (Signed)
Significant improvement after prescription treatment. Continue with vitamin D 1000 IUs daily. Repeat vitamin D in 3 months.

## 2022-03-05 NOTE — Assessment & Plan Note (Signed)
Discussed the importance of a healthy diet and regular exercise in order for weight loss, and to reduce the risk of further co-morbidity.  Repeat A1c in 3 months. Referral placed to nutritionist.

## 2022-03-05 NOTE — Assessment & Plan Note (Signed)
Controlled.  Continue HCTZ 12.5 mg daily. CMP reviewed from May 2023.

## 2022-03-05 NOTE — Patient Instructions (Signed)
Schedule a lab appointment for 3 months and a nurse visit for your second Shingles vaccine.  You will be contacted regarding your referral to GI for the colonoscopy and for the nutritionist.  Please let us know if you have not been contacted within two weeks.   [facility] your [facility].   It was a pleasure to see you today!  Preventive Care 50-50 Years Old, Female Preventive care refers to lifestyle choices and visits with your health care provider that can promote health and wellness. Preventive care visits are also called wellness exams. What can I expect for my preventive care visit? Counseling Your health care provider may ask you questions about your: Medical history, including: Past medical problems. Family medical history. Pregnancy history. Current health, including: Menstrual cycle. Method of birth control. Emotional well-being. Home life and relationship well-being. Sexual activity and sexual health. Lifestyle, including: Alcohol, nicotine or tobacco, and drug use. Access to firearms. Diet, exercise, and sleep habits. Work and work Statistician. Sunscreen use. Safety issues such as seatbelt and bike helmet use. Physical exam Your health care provider will check your: Height and weight. These may be used to calculate your BMI (body mass index). BMI is a measurement that tells if you are at a healthy weight. Waist circumference. This measures the distance around your waistline. This measurement also tells if you are at a healthy weight and may help predict your risk of certain diseases, such as type 2 diabetes and high blood pressure. Heart rate and blood pressure. Body temperature. Skin for abnormal spots. What immunizations do I need?  Vaccines are usually given at various ages, according to a schedule. Your health care provider will recommend vaccines for you based on your age, medical history, and lifestyle or other factors, such as travel or  where you work. What tests do I need? Screening Your health care provider may recommend screening tests for certain conditions. This may include: Lipid and cholesterol levels. Diabetes screening. This is done by checking your blood sugar (glucose) after you have not eaten for a while (fasting). Pelvic exam and Pap test. Hepatitis B test. Hepatitis C test. HIV (human immunodeficiency virus) test. STI (sexually transmitted infection) testing, if you are at risk. Lung cancer screening. Colorectal cancer screening. Mammogram. Talk with your health care provider about when you should start having regular mammograms. This may depend on whether you have a family history of breast cancer. BRCA-related cancer screening. This may be done if you have a family history of breast, ovarian, tubal, or peritoneal cancers. Bone density scan. This is done to screen for osteoporosis. Talk with your health care provider about your test results, treatment options, and if necessary, the need for more tests. Follow these instructions at home: Eating and drinking  Eat a diet that includes fresh fruits and vegetables, whole grains, lean protein, and low-fat dairy products. Take vitamin and mineral supplements as recommended by your health care provider. Do not drink alcohol if: Your health care provider tells you not to drink. You are pregnant, may be pregnant, or are planning to become pregnant. If you drink alcohol: Limit how much you have to 0-1 drink a day. Know how much alcohol is in your drink. In the [place] one drink equals one 12 oz bottle of beer (355 mL), one 5 oz glass of wine (148 mL), or one 1 oz glass of hard liquor (44 mL). Lifestyle Brush your teeth every morning and night with fluoride toothpaste. Floss one  time each day. Exercise for at least 30 minutes 5 or more days each week. Do not use any products that contain nicotine or tobacco. These products include cigarettes, chewing tobacco, and  vaping devices, such as e-cigarettes. If you need help quitting, ask your health care provider. Do not use drugs. If you are sexually active, practice safe sex. Use a condom or other form of protection to prevent STIs. If you do not wish to become pregnant, use a form of birth control. If you plan to become pregnant, see your health care provider for a prepregnancy visit. Take aspirin only as told by your health care provider. Make sure that you understand how much to take and what form to take. Work with your health care provider to find out whether it is safe and beneficial for you to take aspirin daily. Find healthy ways to manage stress, such as: Meditation, yoga, or listening to music. Journaling. Talking to a trusted person. Spending time with friends and family. Minimize exposure to UV radiation to reduce your risk of skin cancer. Safety Always wear your seat belt while driving or riding in a vehicle. Do not drive: If you have been drinking alcohol. Do not ride with someone who has been drinking. When you are tired or distracted. While texting. If you have been using any mind-altering substances or drugs. Wear a helmet and other protective equipment during sports activities. If you have firearms in your house, make sure you follow all gun safety procedures. Seek help if you have been physically or sexually abused. What's next? Visit your health care provider once a year for an annual wellness visit. Ask your health care provider how often you should have your eyes and teeth checked. Stay up to date on all vaccines. This information is not intended to replace advice given to you by your health care provider. Make sure you discuss any questions you have with your health care provider. Document Revised: 02/21/2021 Document Reviewed: 02/21/2021 Elsevier Patient Education  [place].

## 2022-03-05 NOTE — Assessment & Plan Note (Signed)
Controlled. Continue as needed use of omeprazole 20 mg.

## 2022-03-05 NOTE — Assessment & Plan Note (Signed)
Infrequent, no concerns today. Continue to monitor.  

## 2022-03-05 NOTE — Progress Notes (Signed)
Subjective:    Patient ID: Karina Neal, female    DOB: 1971/06/28, 51 y.o.   MRN: [id number]  HPI  Karina Neal is a very pleasant 51 y.o. female who presents today for complete physical and follow up of chronic conditions.  Immunizations: -Tetanus: 2017 -Influenza: Completed last season  -Covid-19:  -Shingles: Never completed   Diet: Fair diet. She would like to see a nutritionist.  Exercise: Walking 2 times weekly   Eye exam: Completed several years ago Dental exam: Completes semi-annually   Pap Smear: Completed in 2021 Mammogram: Never completed   Colonoscopy: Never completed   BP Readings from Last 3 Encounters:  03/05/22 104/68  01/17/22 128/76  12/18/21 120/82        Review of Systems  Constitutional:  Negative for unexpected weight change.  HENT:  Negative for rhinorrhea.   Respiratory:  Negative for cough and shortness of breath.   Cardiovascular:  Negative for chest pain.  Gastrointestinal:  Negative for constipation and diarrhea.  Genitourinary:  Negative for difficulty urinating.  Musculoskeletal:  Negative for arthralgias and myalgias.       Continued heel pain  Skin:  Negative for rash.  Allergic/Immunologic: Negative for environmental allergies.  Neurological:  Positive for numbness. Negative for dizziness and headaches.  Psychiatric/Behavioral:  The patient is not nervous/anxious.          Past Medical History:  Diagnosis Date   Asthma    Blood pressure elevated    Chicken pox    Constrictive jewelry of finger 10/31/2020   Frequent headaches    Hypertension    Left sided numbness 04/23/2015    Social History   Socioeconomic History   Marital status: Married    Spouse name: Not on file   Number of children: Not on file   Years of education: Not on file   Highest education level: Not on file  Occupational History   Not on file  Tobacco Use   Smoking status: Never   Smokeless tobacco: Never  Substance and Sexual Activity    Alcohol use: Yes    Comment: social   Drug use: No   Sexual activity: Not on file  Other Topics Concern   Not on file  Social History Narrative   ** Merged History Encounter **       Social Determinants of Health   Financial Resource Strain: Not on file  Food Insecurity: Not on file  Transportation Needs: Not on file  Physical Activity: Not on file  Stress: Not on file  Social Connections: Not on file  Intimate Partner Violence: Not on file    Past Surgical History:  Procedure Laterality Date   APPENDECTOMY     CESAREAN SECTION     CESAREAN SECTION     EYE SURGERY      Family History  Problem Relation Age of Onset   Hyperlipidemia Mother    Hypertension Mother    Hyperlipidemia Father    Hypertension Father    Diabetes Father    Hypertension Sister    Hypertension Brother     Allergies  Allergen Reactions   Shellfish Allergy Anaphylaxis    Throat swelling, dyspnea   Penicillins Hives    Has patient had a PCN reaction causing immediate rash, facial/tongue/throat swelling, SOB or lightheadedness with hypotension: Unknown Has patient had a PCN reaction causing severe rash involving mucus membranes or skin necrosis: No Has patient had a PCN reaction that required hospitalization: No Has patient had a PCN  reaction occurring within the last 10 years: No If all of the above answers are "NO", then may proceed with Cephalosporin use.     Current Outpatient Medications on File Prior to Visit  Medication Sig Dispense Refill   cholecalciferol (VITAMIN D3) 25 MCG (1000 UNIT) tablet Take 1,000 Units by mouth daily.     fluticasone (FLONASE) 50 MCG/ACT nasal spray PLACE 1 SPRAY INTO BOTH NOSTRILS 2 (TWO) TIMES DAILY 16 mL 0   hydrochlorothiazide (HYDRODIURIL) 12.5 MG tablet Take 1 tablet (12.5 mg total) by mouth daily. For blood pressure. 90 tablet 3   meloxicam (MOBIC) 7.5 MG tablet 1-2 tabs p.o. daily as needed pain.  Take with food.     naproxen (NAPROSYN) 500 MG tablet  Take 500 mg by mouth 2 (two) times daily.     omeprazole (PRILOSEC) 20 MG capsule Take 1 capsule (20 mg total) by mouth every evening. For cough. 90 capsule 0   vitamin B-12 (CYANOCOBALAMIN) 100 MCG tablet Take 100 mcg by mouth daily.     No current facility-administered medications on file prior to visit.    BP 104/68   Pulse 86   Temp 98.6 F (37 C) (Oral)   Ht 5\' 4"  (1.626 m)   Wt 222 lb (100.7 kg)   SpO2 98%   BMI 38.11 kg/m  Objective:   Physical Exam HENT:     Right Ear: Tympanic membrane and ear canal normal.     Left Ear: Tympanic membrane and ear canal normal.     Nose: Nose normal.  Eyes:     Conjunctiva/sclera: Conjunctivae normal.     Pupils: Pupils are equal, round, and reactive to light.  Neck:     Thyroid: No thyromegaly.  Cardiovascular:     Rate and Rhythm: Normal rate and regular rhythm.     Heart sounds: No murmur heard. Pulmonary:     Effort: Pulmonary effort is normal.     Breath sounds: Normal breath sounds. No rales.  Abdominal:     General: Bowel sounds are normal.     Palpations: Abdomen is soft.     Tenderness: There is no abdominal tenderness.  Musculoskeletal:        General: Normal range of motion.     Cervical back: Neck supple.  Lymphadenopathy:     Cervical: No cervical adenopathy.  Skin:    General: Skin is warm and dry.     Findings: No rash.  Neurological:     Mental Status: She is alert and oriented to person, place, and time.     Cranial Nerves: No cranial nerve deficit.     Deep Tendon Reflexes: Reflexes are normal and symmetric.  Psychiatric:        Mood and Affect: Mood normal.           Assessment & Plan:   Problem List Items Addressed This Visit       Cardiovascular and Mediastinum   Migraines    Infrequent, no concerns today. Continue to monitor.      Essential hypertension    Controlled.  Continue HCTZ 12.5 mg daily. CMP reviewed from May 2023.        Digestive   GERD (gastroesophageal reflux  disease)    Controlled. Continue as needed use of omeprazole 20 mg.        Other   Preventative health care    First Shingrix vaccine due and provided today.  Other vaccines up-to-date. Pap smear up-to-date. Colonoscopy due, referral placed  to GI. Mammogram overdue, orders placed.  Discussed the importance of a healthy diet and regular exercise in order for weight loss, and to reduce the risk of further co-morbidity.  Exam today stable Labs reviewed.      Chronic heel pain    Continued.  Following with podiatry.  She will follow up.      Paresthesia    Slight improvement since replenishment of vitamin B12. Do suspect underlying carpal tunnel given her repetitive work.  Offered EMG testing for which she currently declines at this time.  Continue oral B12 daily, repeat B12 level in 3 months.      Prediabetes    Discussed the importance of a healthy diet and regular exercise in order for weight loss, and to reduce the risk of further co-morbidity.  Repeat A1c in 3 months. Referral placed to nutritionist.      Relevant Orders   Amb ref to Medical Nutrition Therapy-MNT   Vitamin D deficiency    Significant improvement after prescription treatment. Continue with vitamin D 1000 IUs daily. Repeat vitamin D in 3 months.      Other Visit Diagnoses     Encounter for screening mammogram for malignant neoplasm of breast    -  Primary   Relevant Orders   MM 3D SCREEN BREAST BILATERAL   Screening for colon cancer       Relevant Orders   Ambulatory referral to Gastroenterology          Doreene Nest, NP

## 2022-03-05 NOTE — Assessment & Plan Note (Signed)
Continued.  Following with podiatry.  She will follow up.

## 2022-03-05 NOTE — Addendum Note (Signed)
Addended by: Donnamarie Poag on: 03/05/2022 08:52 AM   Modules accepted: Orders

## 2022-03-08 ENCOUNTER — Other Ambulatory Visit: Payer: Self-pay | Admitting: Primary Care

## 2022-03-08 ENCOUNTER — Telehealth: Payer: Self-pay

## 2022-03-08 DIAGNOSIS — R0981 Nasal congestion: Secondary | ICD-10-CM

## 2022-03-08 MED ORDER — FLUTICASONE PROPIONATE 50 MCG/ACT NA SUSP: 1.0000 | Freq: Two times a day (BID) | NASAL | 0 refills | Status: DC

## 2022-03-08 NOTE — Telephone Encounter (Signed)
CALLED PATIENT NO ANSWER LEFT VOICEMAIL FOR A CALL BACK °Letter sent °

## 2022-03-21 ENCOUNTER — Telehealth: Payer: Self-pay | Admitting: Primary Care

## 2022-03-21 NOTE — Telephone Encounter (Signed)
error 

## 2022-03-22 ENCOUNTER — Encounter: Payer: Self-pay | Admitting: Family Medicine

## 2022-03-22 ENCOUNTER — Other Ambulatory Visit: Payer: Self-pay | Admitting: Primary Care

## 2022-03-22 ENCOUNTER — Telehealth (INDEPENDENT_AMBULATORY_CARE_PROVIDER_SITE_OTHER): Payer: BC Managed Care – PPO | Admitting: Family Medicine

## 2022-03-22 VITALS — Temp 99.3°F | Wt 217.0 lb

## 2022-03-22 DIAGNOSIS — J069 Acute upper respiratory infection, unspecified: Secondary | ICD-10-CM | POA: Insufficient documentation

## 2022-03-22 DIAGNOSIS — R0981 Nasal congestion: Secondary | ICD-10-CM

## 2022-03-22 MED ORDER — GUAIFENESIN-CODEINE 100-10 MG/5ML PO SYRP: 5.0000 mL | ORAL_SOLUTION | Freq: Every evening | ORAL | 0 refills | Status: DC | PRN

## 2022-03-22 NOTE — Patient Instructions (Addendum)
Check home COVID test.. send message if positive  < 5 days from illness onset to consider anti-viral medication. Start mucinex DM during the day as needed for cough.  Can use cough suppressant at night prescription to help rest.  Push, fluids, rest.  Call if fever late in illness, or not improving after 5-7 days of illness.  Go to ER if severe shortness of breath or severe chest pain.

## 2022-03-22 NOTE — Assessment & Plan Note (Signed)
Check home COVID test.. send message if positive  < 5 days from illness onset to consider anti-viral medication (comorbidity of HTN). Start mucinex DM during the day as needed for cough.  Can use cough suppressant at night prescription to help rest.  Push, fluids, rest.  Call if fever late in illness, or not improving after 5-7 days of illness.  Go to ER if severe shortness of breath or severe chest pain.

## 2022-03-22 NOTE — Progress Notes (Signed)
VIRTUAL VISIT A virtual visit is felt to be most appropriate for this patient at this time.   I connected with the patient on 03/22/22 at 10:20 AM EDT by virtual telehealth platform and verified that I am speaking with the correct person using two identifiers.   I discussed the limitations, risks, security and privacy concerns of performing an evaluation and management service by  virtual telehealth platform and the availability of in person appointments. I also discussed with the patient that there may be a patient responsible charge related to this service. The patient expressed understanding and agreed to proceed.  Patient location: Home Provider Location: [place] Participants: Karina Neal Karina Neal and Karina Neal  Temperature 99.3 F (37.4 C), temperature source Oral, weight 217 lb (98.4 kg).  Chief Complaint  Patient presents with   Headache    Chest pressure, cough    History of Present Illness:  51 year old female patient of Karina Reel, NP with history of  childhood asthma, HTN presents with new onset  cough  Date of onset: 2 days  Initial started with cough, chest pressure.  Productive cough, clear.  Occ SOB, no wheeze.  Low grade temperature.  Mild sore throat, no ear pain, some sinus pressure.  No myalgia.  Cough keeping her up some at night.  No sick contacts. Works at [employer].  She has not taken anything for symptoms.  COVID 19 screen COVID testing: none COVID vaccine: none COVID exposure: No recent travel or known exposure to COVID19  The importance of social distancing was discussed today.    Review of Systems  Constitutional:  Negative for chills.  HENT:  Positive for sore throat.   Respiratory:  Positive for cough, sputum production and shortness of breath. Negative for hemoptysis and wheezing.   Cardiovascular:  Positive for chest pain.      Past Medical History:  Diagnosis Date   Asthma    Blood pressure elevated    Chicken pox     Constrictive jewelry of finger 10/31/2020   Frequent headaches    Hypertension    Left sided numbness 04/23/2015    reports that she has never smoked. She has never used smokeless tobacco. She reports current alcohol use. She reports that she does not use drugs.   Current Outpatient Medications:    cholecalciferol (VITAMIN D3) 25 MCG (1000 UNIT) tablet, Take 1,000 Units by mouth daily., Disp: , Rfl:    fluticasone (FLONASE) 50 MCG/ACT nasal spray, Place 1 spray into both nostrils 2 (two) times daily., Disp: 16 mL, Rfl: 0   hydrochlorothiazide (HYDRODIURIL) 12.5 MG tablet, Take 1 tablet (12.5 mg total) by mouth daily. For blood pressure., Disp: 90 tablet, Rfl: 3   meloxicam (MOBIC) 7.5 MG tablet, 1-2 tabs p.o. daily as needed pain.  Take with food., Disp: , Rfl:    naproxen (NAPROSYN) 500 MG tablet, Take 500 mg by mouth 2 (two) times daily., Disp: , Rfl:    omeprazole (PRILOSEC) 20 MG capsule, Take 1 capsule (20 mg total) by mouth every evening. For cough., Disp: 90 capsule, Rfl: 0   vitamin B-12 (CYANOCOBALAMIN) 100 MCG tablet, Take 100 mcg by mouth daily., Disp: , Rfl:    Observations/Objective: There were no vitals taken for this visit.  Physical Exam  Physical Exam Constitutional:      General: The patient is not in acute distress. Pulmonary:     Effort: Pulmonary effort is normal. No respiratory distress.  Neurological:     Mental Status:  The patient is alert and oriented to person, place, and time.  Psychiatric:        Mood and Affect: Mood normal.        Behavior: Behavior normal.   Assessment and Plan Problem List Items Addressed This Visit     Viral URI with cough - Primary    Check home COVID test.. send message if positive  < 5 days from illness onset to consider anti-viral medication (comorbidity of HTN). Start mucinex DM during the day as needed for cough.  Can use cough suppressant at night prescription to help rest.  Push, fluids, rest.  Call if fever late in  illness, or not improving after 5-7 days of illness.  Go to ER if severe shortness of breath or severe chest pain.          I discussed the assessment and treatment plan with the patient. The patient was provided an opportunity to ask questions and all were answered. The patient agreed with the plan and demonstrated an understanding of the instructions.   The patient was advised to call back or seek an in-person evaluation if the symptoms worsen or if the condition fails to improve as anticipated.     Kerby Nora, MD

## 2022-03-24 ENCOUNTER — Encounter (INDEPENDENT_AMBULATORY_CARE_PROVIDER_SITE_OTHER): Payer: Self-pay

## 2022-03-28 ENCOUNTER — Ambulatory Visit: Payer: BC Managed Care – PPO | Admitting: Podiatry

## 2022-03-28 ENCOUNTER — Encounter: Payer: Self-pay | Admitting: Podiatry

## 2022-03-28 DIAGNOSIS — Q666 Other congenital valgus deformities of feet: Secondary | ICD-10-CM

## 2022-03-28 DIAGNOSIS — M722 Plantar fascial fibromatosis: Secondary | ICD-10-CM

## 2022-03-28 NOTE — Progress Notes (Signed)
Subjective:  Patient ID: Karina Neal, female    DOB: 1971/02/11,  MRN: [id number]  Chief Complaint  Patient presents with   Plantar Fasciitis    51 y.o. female presents with the above complaint.  Patient presents with left heel pain that has been going on for quite some time.  Patient states is mostly on the bottom part of the foot worse in the morning.  Mostly when she is about to get up.  Sharp intense pain sometimes though the toes will go numb.  She states that it has gotten worse over time hurts with ambulation she has not seen anyone else prior to seeing me she would like to discuss treatment options.   Review of Systems: Negative except as noted in the HPI. Denies N/V/F/Ch.  Past Medical History:  Diagnosis Date   Asthma    Blood pressure elevated    Chicken pox    Constrictive jewelry of finger 10/31/2020   Frequent headaches    Hypertension    Left sided numbness 04/23/2015    Current Outpatient Medications:    cholecalciferol (VITAMIN D3) 25 MCG (1000 UNIT) tablet, Take 1,000 Units by mouth daily., Disp: , Rfl:    fluticasone (FLONASE) 50 MCG/ACT nasal spray, PLACE 1 SPRAY INTO BOTH NOSTRILS 2 (TWO) TIMES DAILY, Disp: 48 mL, Rfl: 0   guaiFENesin-codeine (ROBITUSSIN AC) 100-10 MG/5ML syrup, Take 5-10 mLs by mouth at bedtime as needed for cough., Disp: 180 mL, Rfl: 0   hydrochlorothiazide (HYDRODIURIL) 12.5 MG tablet, Take 1 tablet (12.5 mg total) by mouth daily. For blood pressure., Disp: 90 tablet, Rfl: 3   meloxicam (MOBIC) 7.5 MG tablet, 1-2 tabs p.o. daily as needed pain.  Take with food., Disp: , Rfl:    naproxen (NAPROSYN) 500 MG tablet, Take 500 mg by mouth 2 (two) times daily., Disp: , Rfl:    omeprazole (PRILOSEC) 20 MG capsule, Take 1 capsule (20 mg total) by mouth every evening. For cough., Disp: 90 capsule, Rfl: 0   vitamin B-12 (CYANOCOBALAMIN) 100 MCG tablet, Take 100 mcg by mouth daily., Disp: , Rfl:   Social History   Tobacco Use  Smoking Status Never   Smokeless Tobacco Never    Allergies  Allergen Reactions   Shellfish Allergy Anaphylaxis    Throat swelling, dyspnea   Penicillins Hives    Has patient had a PCN reaction causing immediate rash, facial/tongue/throat swelling, SOB or lightheadedness with hypotension: Unknown Has patient had a PCN reaction causing severe rash involving mucus membranes or skin necrosis: No Has patient had a PCN reaction that required hospitalization: No Has patient had a PCN reaction occurring within the last 10 years: No If all of the above answers are "NO", then may proceed with Cephalosporin use.    Objective:  There were no vitals filed for this visit. There is no height or weight on file to calculate BMI. Constitutional Well developed. Well nourished.  Vascular Dorsalis pedis pulses palpable bilaterally. Posterior tibial pulses palpable bilaterally. Capillary refill normal to all digits.  No cyanosis or clubbing noted. Pedal hair growth normal.  Neurologic Normal speech. Oriented to person, place, and time. Epicritic sensation to light touch grossly present bilaterally.  Dermatologic Nails well groomed and normal in appearance. No open wounds. No skin lesions.  Orthopedic: Normal joint ROM without pain or crepitus bilaterally. No visible deformities. Tender to palpation at the calcaneal tuber left No pain with calcaneal squeeze right. Ankle ROM diminished range of motion bilaterally. Silfverskiold Test: positive bilaterally.  Radiographs: Taken and reviewed. No acute fractures or dislocations. No evidence of stress fracture.  Plantar heel spur present. Posterior heel spur present.   Assessment:   1. Plantar fasciitis of left foot   2. Pes planovalgus     Plan:  Patient was evaluated and treated and all questions answered.  Plantar Fasciitis, left~recurrence with underlying heel spur - XR reviewed as above.  - Educated on icing and stretching. Instructions given.  - Injection  delivered to the plantar fascia as below. - DME: Plantar Fascial Brace - Pharmacologic management: None  Pes planovalgus -I explained to patient the etiology of pes planovalgus and relationship with Planter fasciitis and various treatment options were discussed.  Given patient foot structure in the setting of Planter fasciitis I believe patient will benefit from custom-made orthotics to help control the hindfoot motion support the arch of the foot and take the stress away from plantar fascial.  Patient agrees with the plan like to proceed with orthotics -Patient was casted for orthotics   Procedure: Injection Tendon/Ligament Location: Bilateral plantar fascia at the glabrous junction; medial approach. Skin Prep: alcohol Injectate: 0.5 cc 0.5% marcaine plain, 0.5 cc of 1% Lidocaine, 0.5 cc kenalog 10. Disposition: Patient tolerated procedure well. Injection site dressed with a band-aid.  No follow-ups on file.

## 2022-04-03 ENCOUNTER — Other Ambulatory Visit: Payer: Self-pay | Admitting: Primary Care

## 2022-04-03 DIAGNOSIS — I10 Essential (primary) hypertension: Secondary | ICD-10-CM

## 2022-04-05 ENCOUNTER — Telehealth: Payer: BC Managed Care – PPO | Admitting: Family Medicine

## 2022-04-25 ENCOUNTER — Ambulatory Visit: Payer: BC Managed Care – PPO | Admitting: Podiatry

## 2022-04-25 ENCOUNTER — Telehealth: Payer: Self-pay | Admitting: Podiatry

## 2022-04-25 ENCOUNTER — Ambulatory Visit: Payer: BC Managed Care – PPO | Admitting: Dietician

## 2022-04-25 DIAGNOSIS — M722 Plantar fascial fibromatosis: Secondary | ICD-10-CM | POA: Diagnosis not present

## 2022-04-25 NOTE — Progress Notes (Signed)
Subjective:  Patient ID: Karina Neal, female    DOB: 04-Apr-1971,  MRN: [id number]  Chief Complaint  Patient presents with   Follow-up    Patient states that she is here for plantar fasciitis follow-up left foot.    51 y.o. female presents with the above complaint.  Patient presents for follow-up to left Planter fasciitis.  She is she is doing a lot better the injection helped considerably.  She does not have any further pain.  She denies any other acute complaints.   Review of Systems: Negative except as noted in the HPI. Denies N/V/F/Ch.  Past Medical History:  Diagnosis Date   Asthma    Blood pressure elevated    Chicken pox    Constrictive jewelry of finger 10/31/2020   Frequent headaches    Hypertension    Left sided numbness 04/23/2015    Current Outpatient Medications:    cholecalciferol (VITAMIN D3) 25 MCG (1000 UNIT) tablet, Take 1,000 Units by mouth daily., Disp: , Rfl:    fluticasone (FLONASE) 50 MCG/ACT nasal spray, PLACE 1 SPRAY INTO BOTH NOSTRILS 2 (TWO) TIMES DAILY, Disp: 48 mL, Rfl: 0   guaiFENesin-codeine (ROBITUSSIN AC) 100-10 MG/5ML syrup, Take 5-10 mLs by mouth at bedtime as needed for cough., Disp: 180 mL, Rfl: 0   hydrochlorothiazide (HYDRODIURIL) 12.5 MG tablet, TAKE 1 TABLET (12.5 MG TOTAL) BY MOUTH DAILY. FOR BLOOD PRESSURE., Disp: 90 tablet, Rfl: 2   meloxicam (MOBIC) 7.5 MG tablet, 1-2 tabs p.o. daily as needed pain.  Take with food., Disp: , Rfl:    naproxen (NAPROSYN) 500 MG tablet, Take 500 mg by mouth 2 (two) times daily., Disp: , Rfl:    omeprazole (PRILOSEC) 20 MG capsule, Take 1 capsule (20 mg total) by mouth every evening. For cough., Disp: 90 capsule, Rfl: 0   vitamin B-12 (CYANOCOBALAMIN) 100 MCG tablet, Take 100 mcg by mouth daily., Disp: , Rfl:   Social History   Tobacco Use  Smoking Status Never  Smokeless Tobacco Never    Allergies  Allergen Reactions   Shellfish Allergy Anaphylaxis    Throat swelling, dyspnea   Penicillins Hives     Has patient had a PCN reaction causing immediate rash, facial/tongue/throat swelling, SOB or lightheadedness with hypotension: Unknown Has patient had a PCN reaction causing severe rash involving mucus membranes or skin necrosis: No Has patient had a PCN reaction that required hospitalization: No Has patient had a PCN reaction occurring within the last 10 years: No If all of the above answers are "NO", then may proceed with Cephalosporin use.    Objective:  There were no vitals filed for this visit. There is no height or weight on file to calculate BMI. Constitutional Well developed. Well nourished.  Vascular Dorsalis pedis pulses palpable bilaterally. Posterior tibial pulses palpable bilaterally. Capillary refill normal to all digits.  No cyanosis or clubbing noted. Pedal hair growth normal.  Neurologic Normal speech. Oriented to person, place, and time. Epicritic sensation to light touch grossly present bilaterally.  Dermatologic Nails well groomed and normal in appearance. No open wounds. No skin lesions.  Orthopedic: Normal joint ROM without pain or crepitus bilaterally. No visible deformities. No further tender to palpation at the calcaneal tuber left No pain with calcaneal squeeze right. Ankle ROM diminished range of motion bilaterally. Silfverskiold Test: positive bilaterally.   Radiographs: Taken and reviewed. No acute fractures or dislocations. No evidence of stress fracture.  Plantar heel spur present. Posterior heel spur present.   Assessment:   1.  Plantar fasciitis of left foot      Plan:  Patient was evaluated and treated and all questions answered.  Plantar Fasciitis, left~recurrence with underlying heel spur -Clinically healed after 1 injection.  I discussed with her the importance of shoe gear modification and orthotics management.  Orthotics were dispensed.  If any foot and ankle issues arise in the future asked her to come back and see me.  She states  understanding  Pes planovalgus -I explained to patient the etiology of pes planovalgus and relationship with Planter fasciitis and various treatment options were discussed.  Given patient foot structure in the setting of Planter fasciitis I believe patient will benefit from custom-made orthotics to help control the hindfoot motion support the arch of the foot and take the stress away from plantar fascial.  Patient agrees with the plan like to proceed with orthotics -Orthotics were dispensed     No follow-ups on file.

## 2022-04-25 NOTE — Telephone Encounter (Signed)
Patient picked up her orthotics from [facility] office today.

## 2022-04-29 ENCOUNTER — Telehealth: Payer: Self-pay | Admitting: Podiatry

## 2022-04-29 NOTE — Telephone Encounter (Signed)
Patient picked up orthotics from [facility]

## 2022-04-29 NOTE — Telephone Encounter (Signed)
Patient came in today she was saying the brace she was given a couple months ago for her feet it came around her ankle pt states it wont Velcro anymore. Patient was wondering can she get a new one?

## 2022-04-30 NOTE — Telephone Encounter (Signed)
Called pt let her know ok to replace brace per Dr Allena Katz. Patient will come to [facility] office to get a new one.

## 2022-05-06 ENCOUNTER — Ambulatory Visit: Payer: BC Managed Care – PPO | Admitting: Primary Care

## 2022-05-07 ENCOUNTER — Ambulatory Visit: Payer: BC Managed Care – PPO | Admitting: Primary Care

## 2022-05-22 ENCOUNTER — Other Ambulatory Visit: Payer: Self-pay | Admitting: Primary Care

## 2022-05-22 DIAGNOSIS — R7303 Prediabetes: Secondary | ICD-10-CM

## 2022-05-22 DIAGNOSIS — E559 Vitamin D deficiency, unspecified: Secondary | ICD-10-CM

## 2022-05-22 DIAGNOSIS — E538 Deficiency of other specified B group vitamins: Secondary | ICD-10-CM

## 2022-05-27 ENCOUNTER — Encounter: Payer: Self-pay | Admitting: Podiatry

## 2022-06-05 ENCOUNTER — Other Ambulatory Visit (INDEPENDENT_AMBULATORY_CARE_PROVIDER_SITE_OTHER): Payer: BC Managed Care – PPO

## 2022-06-05 ENCOUNTER — Ambulatory Visit (INDEPENDENT_AMBULATORY_CARE_PROVIDER_SITE_OTHER): Payer: BC Managed Care – PPO | Admitting: *Deleted

## 2022-06-05 DIAGNOSIS — Z23 Encounter for immunization: Secondary | ICD-10-CM

## 2022-06-05 DIAGNOSIS — E559 Vitamin D deficiency, unspecified: Secondary | ICD-10-CM

## 2022-06-05 DIAGNOSIS — R7303 Prediabetes: Secondary | ICD-10-CM

## 2022-06-05 DIAGNOSIS — E538 Deficiency of other specified B group vitamins: Secondary | ICD-10-CM | POA: Diagnosis not present

## 2022-06-05 LAB — VITAMIN D 25 HYDROXY (VIT D DEFICIENCY, FRACTURES): VITD: 34.24 ng/mL (ref 30.00–100.00)

## 2022-06-05 LAB — HEMOGLOBIN A1C: Hgb A1c MFr Bld: 6.2 % (ref 4.6–6.5)

## 2022-06-05 LAB — VITAMIN B12: Vitamin B-12: 476 pg/mL (ref 211–911)

## 2022-06-15 ENCOUNTER — Other Ambulatory Visit: Payer: Self-pay | Admitting: Primary Care

## 2022-06-15 DIAGNOSIS — K219 Gastro-esophageal reflux disease without esophagitis: Secondary | ICD-10-CM

## 2022-06-15 MED ORDER — OMEPRAZOLE 20 MG PO CPDR: 20.0000 mg | DELAYED_RELEASE_CAPSULE | Freq: Every evening | ORAL | 2 refills | Status: DC

## 2022-06-17 ENCOUNTER — Other Ambulatory Visit: Payer: Self-pay | Admitting: Primary Care

## 2022-06-17 DIAGNOSIS — R0981 Nasal congestion: Secondary | ICD-10-CM

## 2022-06-19 ENCOUNTER — Encounter: Payer: Self-pay | Admitting: Dietician

## 2022-06-19 NOTE — Progress Notes (Signed)
Have not heard back from patient to reschedule her missed appointment from 04/25/22. Sent notification to referring provider.

## 2022-08-20 ENCOUNTER — Other Ambulatory Visit: Payer: Self-pay | Admitting: Primary Care

## 2022-08-20 DIAGNOSIS — R7303 Prediabetes: Secondary | ICD-10-CM

## 2022-08-27 ENCOUNTER — Encounter: Payer: Self-pay | Admitting: Primary Care

## 2022-08-27 ENCOUNTER — Ambulatory Visit: Payer: BC Managed Care – PPO | Admitting: Primary Care

## 2022-08-27 VITALS — BP 148/84 | HR 85 | Temp 97.2°F | Ht 64.0 in | Wt 223.0 lb

## 2022-08-27 DIAGNOSIS — R7303 Prediabetes: Secondary | ICD-10-CM | POA: Diagnosis not present

## 2022-08-27 DIAGNOSIS — F419 Anxiety disorder, unspecified: Secondary | ICD-10-CM | POA: Diagnosis not present

## 2022-08-27 DIAGNOSIS — N939 Abnormal uterine and vaginal bleeding, unspecified: Secondary | ICD-10-CM

## 2022-08-27 HISTORY — DX: Abnormal uterine and vaginal bleeding, unspecified: N93.9

## 2022-08-27 LAB — IBC + FERRITIN
Ferritin: 44.3 ng/mL (ref 10.0–291.0)
Iron: 61 ug/dL (ref 42–145)
Saturation Ratios: 15.4 % — ABNORMAL LOW (ref 20.0–50.0)
TIBC: 396.2 ug/dL (ref 250.0–450.0)
Transferrin: 283 mg/dL (ref 212.0–360.0)

## 2022-08-27 LAB — CBC
HCT: 41.9 % (ref 36.0–46.0)
Hemoglobin: 14.2 g/dL (ref 12.0–15.0)
MCHC: 33.9 g/dL (ref 30.0–36.0)
MCV: 89.5 fl (ref 78.0–100.0)
Platelets: 159 10*3/uL (ref 150.0–400.0)
RBC: 4.68 Mil/uL (ref 3.87–5.11)
RDW: 14.5 % (ref 11.5–15.5)
WBC: 5.5 10*3/uL (ref 4.0–10.5)

## 2022-08-27 LAB — HEMOGLOBIN A1C: Hgb A1c MFr Bld: 5.9 % (ref 4.6–6.5)

## 2022-08-27 MED ORDER — SERTRALINE HCL 25 MG PO TABS: 25.0000 mg | ORAL_TABLET | Freq: Every day | ORAL | 0 refills | Status: DC

## 2022-08-27 MED ORDER — HYDROXYZINE HCL 10 MG PO TABS: 10.0000 mg | ORAL_TABLET | Freq: Every evening | ORAL | 0 refills | Status: DC | PRN

## 2022-08-27 NOTE — Progress Notes (Signed)
Subjective:    Patient ID: Karina Neal, female    DOB: 08/25/1971, 51 y.o.   MRN: [id number]  Anxiety Symptoms include nervous/anxious behavior and palpitations. Patient reports no shortness of breath.      Karina Neal is a very pleasant 51 y.o. female with a history of hypertension, migraines, CVA, prediabetes, chronic fatigue who presents today to discuss anxiety.  Symptom onset about two weeks ago with symptoms including sleep disturbance, decreased appetite, chest heaviness. She is going through a lot of personal stress, separation from her spouse, which has triggered these symptoms. She's also noticed other physical symptoms including vaginal bleeding since 08/10/22.   Menstrual cycles have been irregular since June 2023, typical menses lasts for 4-5 days, except for this episode. She has been passing clots with vaginal bleeding. She has noticed intermittent palpitations and fatigue. She is not taking an iron supplement.   She's tried taking Melatonin and Tylenol PM for sleep without improvement in sleep.    Review of Systems  Respiratory:  Positive for chest tightness. Negative for shortness of breath.   Cardiovascular:  Positive for palpitations.  Neurological:  Negative for light-headedness.  Psychiatric/Behavioral:  Positive for sleep disturbance. The patient is nervous/anxious.          Past Medical History:  Diagnosis Date   Asthma    Blood pressure elevated    Chicken pox    Constrictive jewelry of finger 10/31/2020   Frequent headaches    Hypertension    Left sided numbness 04/23/2015    Social History   Socioeconomic History   Marital status: Married    Spouse name: Not on file   Number of children: Not on file   Years of education: Not on file   Highest education level: Not on file  Occupational History   Not on file  Tobacco Use   Smoking status: Never   Smokeless tobacco: Never  Substance and Sexual Activity   Alcohol use: Yes    Comment:  social   Drug use: No   Sexual activity: Not on file  Other Topics Concern   Not on file  Social History Narrative   ** Merged History Encounter **       ** Merged History Encounter **       Social Determinants of Health   Financial Resource Strain: Not on file  Food Insecurity: Not on file  Transportation Needs: Not on file  Physical Activity: Not on file  Stress: Not on file  Social Connections: Not on file  Intimate Partner Violence: Not on file    Past Surgical History:  Procedure Laterality Date   APPENDECTOMY     CESAREAN SECTION     CESAREAN SECTION     EYE SURGERY      Family History  Problem Relation Age of Onset   Hyperlipidemia Mother    Hypertension Mother    Hyperlipidemia Father    Hypertension Father    Diabetes Father    Hypertension Sister    Hypertension Brother     Allergies  Allergen Reactions   Shellfish Allergy Anaphylaxis    Throat swelling, dyspnea   Penicillins Hives    Has patient had a PCN reaction causing immediate rash, facial/tongue/throat swelling, SOB or lightheadedness with hypotension: Unknown Has patient had a PCN reaction causing severe rash involving mucus membranes or skin necrosis: No Has patient had a PCN reaction that required hospitalization: No Has patient had a PCN reaction occurring within the last 10 years:  No If all of the above answers are "NO", then may proceed with Cephalosporin use.     Current Outpatient Medications on File Prior to Visit  Medication Sig Dispense Refill   cholecalciferol (VITAMIN D3) 25 MCG (1000 UNIT) tablet Take 1,000 Units by mouth daily.     fluticasone (FLONASE) 50 MCG/ACT nasal spray PLACE 1 SPRAY INTO BOTH NOSTRILS 2 (TWO) TIMES DAILY 48 mL 1   guaiFENesin-codeine (ROBITUSSIN AC) 100-10 MG/5ML syrup Take 5-10 mLs by mouth at bedtime as needed for cough. 180 mL 0   hydrochlorothiazide (HYDRODIURIL) 12.5 MG tablet TAKE 1 TABLET (12.5 MG TOTAL) BY MOUTH DAILY. FOR BLOOD PRESSURE. 90  tablet 2   meloxicam (MOBIC) 7.5 MG tablet 1-2 tabs p.o. daily as needed pain.  Take with food.     naproxen (NAPROSYN) 500 MG tablet Take 500 mg by mouth 2 (two) times daily.     omeprazole (PRILOSEC) 20 MG capsule Take 1 capsule (20 mg total) by mouth every evening. For cough. 90 capsule 2   vitamin B-12 (CYANOCOBALAMIN) 100 MCG tablet Take 100 mcg by mouth daily.     No current facility-administered medications on file prior to visit.    BP (!) 148/84   Pulse 85   Temp (!) 97.2 F (36.2 C) (Temporal)   Ht 5\' 4"  (1.626 m)   Wt 223 lb (101.2 kg)   LMP 08/10/2022 (Exact Date)   SpO2 98%   BMI 38.28 kg/m  Objective:   Physical Exam Cardiovascular:     Rate and Rhythm: Normal rate and regular rhythm.  Pulmonary:     Effort: Pulmonary effort is normal.     Breath sounds: Normal breath sounds.  Musculoskeletal:     Cervical back: Neck supple.  Skin:    General: Skin is warm and dry.  Psychiatric:        Mood and Affect: Mood normal.           Assessment & Plan:   Problem List Items Addressed This Visit       Genitourinary   Abnormal uterine bleeding    Could be stress induced, but given information provided in HPI, will obtain further work up.  Checking labs today including CBC and iron studies. Pelvic/Transvaginal 14/10/2021 ordered and pending.      Relevant Orders   IBC + Ferritin   CBC   US PELVIC COMPLETE WITH TRANSVAGINAL     Other   Prediabetes    Repeat A1C pending today.      Relevant Orders   Hemoglobin A1c   Anxiety - Primary    No prior history, certainly provoked from recent separation from spouse.   Discussed options for treatment, she agrees to therapy and medication. Rx for Zoloft 25 mg to take daily and hydroxyzine 10 mg to use PRN for sleep sent to pharmacy. Referral placed for therapy.  We discussed possible side effects of headache, GI upset, drowsiness, and SI/HI. If thoughts of SI/HI develop, we discussed to present to the emergency  immediately. Patient verbalized understanding.   Follow up in 6 weeks for re-evaluation.        Relevant Medications   sertraline (ZOLOFT) 25 MG tablet   hydrOXYzine (ATARAX) 10 MG tablet   Other Relevant Orders   Ambulatory referral to Psychology       [place], NP

## 2022-08-27 NOTE — Assessment & Plan Note (Signed)
Repeat A1C pending today ?

## 2022-08-27 NOTE — Assessment & Plan Note (Signed)
Could be stress induced, but given information provided in HPI, will obtain further work up.  Checking labs today including CBC and iron studies. Pelvic/Transvaginal US ordered and pending.

## 2022-08-27 NOTE — Patient Instructions (Addendum)
Start sertraline (Zoloft) 25 mg for anxiety. Take 1 tablet by mouth once daily.  You may take hydroxyzine 10 mg at bedtime for sleep.   You will either be contacted via phone regarding your referral to therapy and for the vaginal ultrasound, or you may receive a letter on your MyChart portal from our referral team with instructions for scheduling an appointment. Please let us know if you have not been contacted by anyone within two weeks.  Please schedule a follow up visit for 6 weeks for follow up of anxiety. This can be virtual or in person.  It was a pleasure to see you today!

## 2022-08-27 NOTE — Assessment & Plan Note (Signed)
No prior history, certainly provoked from recent separation from spouse.   Discussed options for treatment, she agrees to therapy and medication. Rx for Zoloft 25 mg to take daily and hydroxyzine 10 mg to use PRN for sleep sent to pharmacy. Referral placed for therapy.  We discussed possible side effects of headache, GI upset, drowsiness, and SI/HI. If thoughts of SI/HI develop, we discussed to present to the emergency immediately. Patient verbalized understanding.   Follow up in 6 weeks for re-evaluation.

## 2022-09-05 ENCOUNTER — Other Ambulatory Visit: Payer: BC Managed Care – PPO

## 2022-09-08 ENCOUNTER — Other Ambulatory Visit: Payer: Self-pay | Admitting: Primary Care

## 2022-09-08 DIAGNOSIS — F419 Anxiety disorder, unspecified: Secondary | ICD-10-CM

## 2022-09-11 ENCOUNTER — Ambulatory Visit
Admission: RE | Admit: 2022-09-11 | Discharge: 2022-09-11 | Disposition: A | Discharge status: Home / Self Care | Payer: BC Managed Care – PPO | Source: Ambulatory Visit | Attending: Primary Care | Admitting: Primary Care

## 2022-09-11 DIAGNOSIS — N939 Abnormal uterine and vaginal bleeding, unspecified: Secondary | ICD-10-CM

## 2022-09-22 ENCOUNTER — Other Ambulatory Visit: Payer: Self-pay | Admitting: Primary Care

## 2022-09-22 DIAGNOSIS — F419 Anxiety disorder, unspecified: Secondary | ICD-10-CM

## 2022-09-23 NOTE — Telephone Encounter (Signed)
Please call patient:  Received refill request for hydroxyzine that was prescribed PRN for anxiety, does she acutally need a refill?

## 2022-09-24 NOTE — Telephone Encounter (Signed)
Called and spoke to patient, she does not need a refill on this medication , she still has plenty on hand

## 2022-09-25 NOTE — Telephone Encounter (Signed)
Noted  

## 2022-10-08 ENCOUNTER — Ambulatory Visit: Payer: BC Managed Care – PPO | Admitting: Primary Care

## 2022-10-10 ENCOUNTER — Ambulatory Visit: Payer: BC Managed Care – PPO | Admitting: Primary Care

## 2022-11-18 ENCOUNTER — Other Ambulatory Visit: Payer: Self-pay | Admitting: Primary Care

## 2022-11-18 DIAGNOSIS — F419 Anxiety disorder, unspecified: Secondary | ICD-10-CM

## 2022-11-18 MED ORDER — HYDROXYZINE HCL 10 MG PO TABS: 10.0000 mg | ORAL_TABLET | Freq: Every evening | ORAL | 0 refills | Status: DC | PRN

## 2022-11-18 NOTE — Telephone Encounter (Signed)
From: Marlon Pel To: Office of Pleas Koch, NP Sent: 11/18/2022 1:12 PM EDT Subject: Medication Renewal Request  Refills have been requested for the following medications:   hydrOXYzine (ATARAX) 10 MG tablet [Daoud Lobue K [facility] [place]]  Preferred pharmacy: [id number]- [place], [place] - [address] Delivery method: PArlyss Gandy

## 2022-11-27 ENCOUNTER — Other Ambulatory Visit: Payer: Self-pay | Admitting: Primary Care

## 2022-11-27 DIAGNOSIS — F419 Anxiety disorder, unspecified: Secondary | ICD-10-CM

## 2022-12-12 ENCOUNTER — Other Ambulatory Visit: Payer: Self-pay | Admitting: Primary Care

## 2022-12-12 DIAGNOSIS — F419 Anxiety disorder, unspecified: Secondary | ICD-10-CM

## 2022-12-12 NOTE — Telephone Encounter (Signed)
From: Marlon Pel To: Office of Pleas Koch, NP Sent: 12/12/2022 9:51 AM EDT Subject: Medication Renewal Request  Refills have been requested for the following medications:   sertraline (ZOLOFT) 25 MG tablet [Amol Domanski K Paydon Carll]  Preferred pharmacy: [id number] - [place], [place] - [address] Delivery method: Arlyss Gandy

## 2022-12-24 ENCOUNTER — Other Ambulatory Visit: Payer: Self-pay | Admitting: Primary Care

## 2022-12-24 DIAGNOSIS — Z1231 Encounter for screening mammogram for malignant neoplasm of breast: Secondary | ICD-10-CM

## 2023-01-23 ENCOUNTER — Encounter: Payer: Self-pay | Admitting: Primary Care

## 2023-01-23 ENCOUNTER — Ambulatory Visit: Payer: PRIVATE HEALTH INSURANCE | Admitting: Primary Care

## 2023-01-23 VITALS — BP 132/86 | HR 85 | Temp 98.2°F | Ht 64.0 in | Wt 216.0 lb

## 2023-01-23 DIAGNOSIS — M25512 Pain in left shoulder: Secondary | ICD-10-CM | POA: Diagnosis not present

## 2023-01-23 DIAGNOSIS — M5441 Lumbago with sciatica, right side: Secondary | ICD-10-CM | POA: Diagnosis not present

## 2023-01-23 DIAGNOSIS — G8929 Other chronic pain: Secondary | ICD-10-CM | POA: Insufficient documentation

## 2023-01-23 MED ORDER — PREDNISONE 20 MG PO TABS
ORAL_TABLET | ORAL | 0 refills | Status: DC
Start: 2023-01-23 — End: 2023-05-22

## 2023-01-23 MED ORDER — KETOROLAC TROMETHAMINE 60 MG/2ML IM SOLN
60.0000 mg | Freq: Once | INTRAMUSCULAR | Status: AC
Start: 2023-01-23 — End: 2023-01-23
  Administered 2023-01-23: 60 mg via INTRAMUSCULAR

## 2023-01-23 MED ORDER — CYCLOBENZAPRINE HCL 5 MG PO TABS: 5.0000 mg | ORAL_TABLET | Freq: Three times a day (TID) | ORAL | 0 refills | Status: DC | PRN

## 2023-01-23 NOTE — Progress Notes (Signed)
Subjective:    Patient ID: Karina Neal, female    DOB: [birth year], 52 y.o.   MRN: [id number]  HPI  Maline Vossen is a very pleasant 52 y.o. female with a history of chronic heel pain, hypertension, abnormal uterine bleeding, fatigue, prediabetes who presents today to discuss a few concerns.  1) Back Pain: Acute to the mid lower back for the last 3 weeks. Her pain is constant for which she describes as "stabbing" and radiates upward to the mid thoracic back. She fell forward tripping over a drain five days ago, got back up immediately, and the following day she felt increased pain.  Standing, laying, or sitting for prolonged periods of time makes symptoms worse.   Her back pain does radiate to her right posterior thigh at times with numbness. She denies loss of bowel control, shortness of breath. She did not hit her head.  She works for [employer] and is on her feet for 10-12 hours a day, unloads trucks, is active doing repetitive movements. She's been taking Aleve and Goody Powder without improvement.   2) Shoulder Pain: Acute to the left lateral neck with radiatoin to the top of the left shoulder for the last 1 month. She describes her pain as constant and throbbing. Her pain is worse with abduction and she cannot extend her arm out past 90 degrees without pain.  She does have numbness to her fingers. She denies injury/trauma. She does a lot repetitive movement and overhead lifting for work.      Review of Systems  Musculoskeletal:  Positive for arthralgias, back pain, myalgias and neck pain.  Skin:  Negative for color change.  Neurological:  Positive for weakness and numbness.         Past Medical History:  Diagnosis Date   Asthma    Blood pressure elevated    Chicken pox    Constrictive jewelry of finger 10/31/2020   Frequent headaches    Hypertension    Left sided numbness 04/23/2015    Social History   Socioeconomic History   Marital status: Married    Spouse name:  Not on file   Number of children: Not on file   Years of education: Not on file   Highest education level: Not on file  Occupational History   Not on file  Tobacco Use   Smoking status: Never   Smokeless tobacco: Never  Substance and Sexual Activity   Alcohol use: Yes    Comment: social   Drug use: No   Sexual activity: Not on file  Other Topics Concern   Not on file  Social History Narrative   ** Merged History Encounter **       ** Merged History Encounter **       Social Determinants of Health   Financial Resource Strain: Not on file  Food Insecurity: Not on file  Transportation Needs: Not on file  Physical Activity: Not on file  Stress: Not on file  Social Connections: Not on file  Intimate Partner Violence: Not on file    Past Surgical History:  Procedure Laterality Date   APPENDECTOMY     CESAREAN SECTION     CESAREAN SECTION     EYE SURGERY      Family History  Problem Relation Age of Onset   Hyperlipidemia Mother    Hypertension Mother    Hyperlipidemia Father    Hypertension Father    Diabetes Father    Hypertension Sister  Hypertension Brother     Allergies  Allergen Reactions   Shellfish Allergy Anaphylaxis    Throat swelling, dyspnea   Penicillins Hives    Has patient had a PCN reaction causing immediate rash, facial/tongue/throat swelling, SOB or lightheadedness with hypotension: Unknown Has patient had a PCN reaction causing severe rash involving mucus membranes or skin necrosis: No Has patient had a PCN reaction that required hospitalization: No Has patient had a PCN reaction occurring within the last 10 years: No If all of the above answers are "NO", then may proceed with Cephalosporin use.     Current Outpatient Medications on File Prior to Visit  Medication Sig Dispense Refill   fluticasone (FLONASE) 50 MCG/ACT nasal spray PLACE 1 SPRAY INTO BOTH NOSTRILS 2 (TWO) TIMES DAILY 48 mL 1   hydrochlorothiazide (HYDRODIURIL) 12.5 MG  tablet TAKE 1 TABLET (12.5 MG TOTAL) BY MOUTH DAILY. FOR BLOOD PRESSURE. 90 tablet 2   hydrOXYzine (ATARAX) 10 MG tablet Take 1-2 tablets (10-20 mg total) by mouth at bedtime as needed for anxiety. 60 tablet 0   meloxicam (MOBIC) 7.5 MG tablet 1-2 tabs p.o. daily as needed pain.  Take with food.     naproxen (NAPROSYN) 500 MG tablet Take 500 mg by mouth 2 (two) times daily.     omeprazole (PRILOSEC) 20 MG capsule Take 1 capsule (20 mg total) by mouth every evening. For cough. 90 capsule 2   sertraline (ZOLOFT) 25 MG tablet TAKE 1 TABLET (25 MG TOTAL) BY MOUTH DAILY. FOR ANXIETY 90 tablet 0   cholecalciferol (VITAMIN D3) 25 MCG (1000 UNIT) tablet Take 1,000 Units by mouth daily. (Patient not taking: Reported on 01/23/2023)     guaiFENesin-codeine (ROBITUSSIN AC) 100-10 MG/5ML syrup Take 5-10 mLs by mouth at bedtime as needed for cough. (Patient not taking: Reported on 01/23/2023) 180 mL 0   vitamin B-12 (CYANOCOBALAMIN) 100 MCG tablet Take 100 mcg by mouth daily. (Patient not taking: Reported on 01/23/2023)     No current facility-administered medications on file prior to visit.    BP 132/86   Pulse 85   Temp 98.2 F (36.8 C) (Temporal)   Ht 5\' 4"  (1.626 m)   Wt 216 lb (98 kg)   SpO2 98%   BMI 37.08 kg/m  Objective:   Physical Exam Constitutional:      General: She is not in acute distress. Pulmonary:     Effort: Pulmonary effort is normal.  Musculoskeletal:     Left shoulder: Decreased range of motion. Normal strength.     Thoracic back: Tenderness present.     Lumbar back: Tenderness present. Decreased range of motion. Negative right straight leg raise test and negative left straight leg raise test.       Back:     Comments: Pain to left shoulder with lateral and forward abduction past 90 degrees. 5/5 strength to bilateral lower extremities.            Assessment & Plan:  Acute midline low back pain with right-sided sciatica Assessment & Plan: Symptoms and exam consistent  for lumbar strain with sciatica. No alarm signs.  IM Toradol 60 mg provided today. Rx for prednisone taper sent to pharmacy. Start cyclobenzaprine 5 mg TID PRN. Drowsiness precautions provided.   Handout provided for stretching. Discussed to remain active and walk as tolerated. Work note provided.   Orders: -     Cyclobenzaprine HCl; Take 1 tablet (5 mg total) by mouth 3 (three) times daily as needed for muscle  spasms.  Dispense: 15 tablet; Refill: 0 -     predniSONE; Take 3 tablets by mouth every morning for 3 days, then 2 tablets for 3 days, then 1 tablet for 3 days.  Dispense: 18 tablet; Refill: 0 -     Ketorolac Tromethamine  Acute pain of left shoulder Assessment & Plan: Symptoms and presentation representative of adhesive capsulitis.  IM Toradol 60 mg provided today. Rx for prednisone taper sent to pharmacy. Start cyclobenzaprine 5 mg TID PRN. Drowsiness precautions provided.   Follow up with sports medicine for injection if no improvement. Discussed the importance of avoiding overuse, repetitive movement. Work note provided to take a few days off.  Orders: -     Cyclobenzaprine HCl; Take 1 tablet (5 mg total) by mouth 3 (three) times daily as needed for muscle spasms.  Dispense: 15 tablet; Refill: 0 -     predniSONE; Take 3 tablets by mouth every morning for 3 days, then 2 tablets for 3 days, then 1 tablet for 3 days.  Dispense: 18 tablet; Refill: 0 -     Ketorolac Tromethamine        Doreene Nest, NP

## 2023-01-23 NOTE — Assessment & Plan Note (Signed)
Symptoms and presentation representative of adhesive capsulitis.  IM Toradol 60 mg provided today. Rx for prednisone taper sent to pharmacy. Start cyclobenzaprine 5 mg TID PRN. Drowsiness precautions provided.   Follow up with sports medicine for injection if no improvement. Discussed the importance of avoiding overuse, repetitive movement. Work note provided to take a few days off.

## 2023-01-23 NOTE — Patient Instructions (Signed)
Start prednisone tablets for pain and inflammation. Take 3 tablets by mouth every morning for 3 days, then 2 tablets for 3 days, then 1 tablet for 3 days.  You may take cyclobenzaprine 5 mg tablets (muscle relaxer) every 8 hours as needed. This may cause drowsiness.   Try to stretch and walk when able.   It was a pleasure to see you today!  Low Back Sprain or Strain Rehab  Ask your health care provider which exercises are safe for you. Do exercises exactly as told by your health care provider and adjust them as directed. It is normal to feel mild stretching, pulling, tightness, or discomfort as you do these exercises. Stop right away if you feel sudden pain or your pain gets worse. Do not begin these exercises until told by your health care provider. Stretching and range-of-motion exercises These exercises warm up your muscles and joints and improve the movement and flexibility of your back. These exercises also help to relieve pain, numbness, and tingling. Lumbar rotation  Lie on your back on a firm bed or the floor with your knees bent. Straighten your arms out to your sides so each arm forms a 90-degree angle (right angle) with a side of your body. Slowly move (rotate) both of your knees to one side of your body until you feel a stretch in your lower back (lumbar). Try not to let your shoulders lift off the floor. Hold this position for __________ seconds. Tense your abdominal muscles and slowly move your knees back to the starting position. Repeat this exercise on the other side of your body. Repeat __________ times. Complete this exercise __________ times a day. Single knee to chest  Lie on your back on a firm bed or the floor with both legs straight. Bend one of your knees. Use your hands to move your knee up toward your chest until you feel a gentle stretch in your lower back and buttock. Hold your leg in this position by holding on to the front of your knee. Keep your other leg as  straight as possible. Hold this position for __________ seconds. Slowly return to the starting position. Repeat with your other leg. Repeat __________ times. Complete this exercise __________ times a day. Prone extension on elbows  Lie on your abdomen on a firm bed or the floor (prone position). Prop yourself up on your elbows. Use your arms to help lift your chest up until you feel a gentle stretch in your abdomen and your lower back. This will place some of your body weight on your elbows. If this is uncomfortable, try stacking pillows under your chest. Your hips should stay down, against the surface that you are lying on. Keep your hip and back muscles relaxed. Hold this position for __________ seconds. Slowly relax your upper body and return to the starting position. Repeat __________ times. Complete this exercise __________ times a day. Strengthening exercises These exercises build strength and endurance in your back. Endurance is the ability to use your muscles for a long time, even after they get tired. Pelvic tilt This exercise strengthens the muscles that lie deep in the abdomen. Lie on your back on a firm bed or the floor with your legs extended. Bend your knees so they are pointing toward the ceiling and your feet are flat on the floor. Tighten your lower abdominal muscles to press your lower back against the floor. This motion will tilt your pelvis so your tailbone points up toward the ceiling  instead of pointing to your feet or the floor. To help with this exercise, you may place a small towel under your lower back and try to push your back into the towel. Hold this position for __________ seconds. Let your muscles relax completely before you repeat this exercise. Repeat __________ times. Complete this exercise __________ times a day. Alternating arm and leg raises  Get on your hands and knees on a firm surface. If you are on a hard floor, you may want to use padding, such as  an exercise mat, to cushion your knees. Line up your arms and legs. Your hands should be directly below your shoulders, and your knees should be directly below your hips. Lift your left leg behind you. At the same time, raise your right arm and straighten it in front of you. Do not lift your leg higher than your hip. Do not lift your arm higher than your shoulder. Keep your abdominal and back muscles tight. Keep your hips facing the ground. Do not arch your back. Keep your balance carefully, and do not hold your breath. Hold this position for __________ seconds. Slowly return to the starting position. Repeat with your right leg and your left arm. Repeat __________ times. Complete this exercise __________ times a day. Abdominal set with straight leg raise  Lie on your back on a firm bed or the floor. Bend one of your knees and keep your other leg straight. Tense your abdominal muscles and lift your straight leg up, 4-6 inches (10-15 cm) off the ground. Keep your abdominal muscles tight and hold this position for __________ seconds. Do not hold your breath. Do not arch your back. Keep it flat against the ground. Keep your abdominal muscles tense as you slowly lower your leg back to the starting position. Repeat with your other leg. Repeat __________ times. Complete this exercise __________ times a day. Single leg lower with bent knees Lie on your back on a firm bed or the floor. Tense your abdominal muscles and lift your feet off the floor, one foot at a time, so your knees and hips are bent in 90-degree angles (right angles). Your knees should be over your hips and your lower legs should be parallel to the floor. Keeping your abdominal muscles tense and your knee bent, slowly lower one of your legs so your toe touches the ground. Lift your leg back up to return to the starting position. Do not hold your breath. Do not let your back arch. Keep your back flat against the ground. Repeat  with your other leg. Repeat __________ times. Complete this exercise __________ times a day. Posture and body mechanics Good posture and healthy body mechanics can help to relieve stress in your body's tissues and joints. Body mechanics refers to the movements and positions of your body while you do your daily activities. Posture is part of body mechanics. Good posture means: Your spine is in its natural S-curve position (neutral). Your shoulders are pulled back slightly. Your head is not tipped forward (neutral). Follow these guidelines to improve your posture and body mechanics in your everyday activities. Standing  When standing, keep your spine neutral and your feet about hip-width apart. Keep a slight bend in your knees. Your ears, shoulders, and hips should line up. When you do a task in which you stand in one place for a long time, place one foot up on a stable object that is 2-4 inches (5-10 cm) high, such as a footstool. This  helps keep your spine neutral. Sitting  When sitting, keep your spine neutral and keep your feet flat on the floor. Use a footrest, if necessary, and keep your thighs parallel to the floor. Avoid rounding your shoulders, and avoid tilting your head forward. When working at a desk or a computer, keep your desk at a height where your hands are slightly lower than your elbows. Slide your chair under your desk so you are close enough to maintain good posture. When working at a computer, place your monitor at a height where you are looking straight ahead and you do not have to tilt your head forward or downward to look at the screen. Resting When lying down and resting, avoid positions that are most painful for you. If you have pain with activities such as sitting, bending, stooping, or squatting, lie in a position in which your body does not bend very much. For example, avoid curling up on your side with your arms and knees near your chest (fetal position). If you have  pain with activities such as standing for a long time or reaching with your arms, lie with your spine in a neutral position and bend your knees slightly. Try the following positions: Lying on your side with a pillow between your knees. Lying on your back with a pillow under your knees. Lifting  When lifting objects, keep your feet at least shoulder-width apart and tighten your abdominal muscles. Bend your knees and hips and keep your spine neutral. It is important to lift using the strength of your legs, not your back. Do not lock your knees straight out. Always ask for help to lift heavy or awkward objects. This information is not intended to replace advice given to you by your health care provider. Make sure you discuss any questions you have with your health care provider. Document Revised: 11/13/2020 Document Reviewed: 11/13/2020 Elsevier Patient Education  2023 [employer].

## 2023-01-23 NOTE — Assessment & Plan Note (Signed)
Symptoms and exam consistent for lumbar strain with sciatica. No alarm signs.  IM Toradol 60 mg provided today. Rx for prednisone taper sent to pharmacy. Start cyclobenzaprine 5 mg TID PRN. Drowsiness precautions provided.   Handout provided for stretching. Discussed to remain active and walk as tolerated. Work note provided.

## 2023-02-11 NOTE — Progress Notes (Unsigned)
    Shalene Gallen T. Rollen Selders, MD, CAQ Sports Medicine [facility] [address] [place], [address]  Phone: [phone]  FAX: [phone]  Karina Neal - 52 y.o. female  MRN [id number]  Date of Birth: 02-06-1971  Date: 02/12/2023  PCP: Doreene Nest, NP  Referral: Doreene Nest, NP  No chief complaint on file.  Subjective:   Karina Neal is a 52 y.o. very pleasant female patient with There is no height or weight on file to calculate BMI. who presents with the following:  Patient presents for lab testing?  E. coli?    Review of Systems is noted in the HPI, as appropriate  Objective:   There were no vitals taken for this visit.  GEN: No acute distress; alert,appropriate. PULM: Breathing comfortably in no respiratory distress PSYCH: Normally interactive.   Laboratory and Imaging Data:  Assessment and Plan:   ***

## 2023-02-12 ENCOUNTER — Encounter: Payer: Self-pay | Admitting: Family Medicine

## 2023-02-12 ENCOUNTER — Ambulatory Visit: Payer: PRIVATE HEALTH INSURANCE | Admitting: Family Medicine

## 2023-02-12 VITALS — BP 122/84 | HR 73 | Temp 97.2°F | Ht 64.0 in | Wt 218.2 lb

## 2023-02-12 DIAGNOSIS — R350 Frequency of micturition: Secondary | ICD-10-CM | POA: Diagnosis not present

## 2023-02-12 DIAGNOSIS — R1084 Generalized abdominal pain: Secondary | ICD-10-CM | POA: Diagnosis not present

## 2023-02-12 LAB — BASIC METABOLIC PANEL
BUN: 12 mg/dL (ref 6–23)
CO2: 25 mEq/L (ref 19–32)
Calcium: 9.5 mg/dL (ref 8.4–10.5)
Chloride: 103 mEq/L (ref 96–112)
Creatinine, Ser: 0.76 mg/dL (ref 0.40–1.20)
GFR: 90.91 mL/min (ref >60.00)
Glucose, Bld: 102 mg/dL — ABNORMAL HIGH (ref 70–99)
Potassium: 3.8 mEq/L (ref 3.5–5.1)
Sodium: 140 mEq/L (ref 135–145)

## 2023-02-12 LAB — POC URINALSYSI DIPSTICK (AUTOMATED)
Bilirubin, UA: NEGATIVE
Blood, UA: NEGATIVE
Glucose, UA: NEGATIVE
Ketones, UA: NEGATIVE
Leukocytes, UA: NEGATIVE
Nitrite, UA: NEGATIVE
Protein, UA: NEGATIVE
Spec Grav, UA: 1.02 (ref 1.010–1.025)
Urobilinogen, UA: 0.2 E.U./dL
pH, UA: 6 (ref 5.0–8.0)

## 2023-02-12 LAB — HEPATIC FUNCTION PANEL
ALT: 18 U/L (ref 0–35)
AST: 19 U/L (ref 0–37)
Albumin: 4.4 g/dL (ref 3.5–5.2)
Alkaline Phosphatase: 53 U/L (ref 39–117)
Bilirubin, Direct: 0.1 mg/dL (ref 0.0–0.3)
Total Bilirubin: 0.6 mg/dL (ref 0.2–1.2)
Total Protein: 7.7 g/dL (ref 6.0–8.3)

## 2023-02-12 LAB — CBC WITH DIFFERENTIAL/PLATELET
Basophils Absolute: 0 10*3/uL (ref 0.0–0.1)
Basophils Relative: 0.8 % (ref 0.0–3.0)
Eosinophils Absolute: 0.1 10*3/uL (ref 0.0–0.7)
Eosinophils Relative: 2.7 % (ref 0.0–5.0)
HCT: 41.7 % (ref 36.0–46.0)
Hemoglobin: 13.6 g/dL (ref 12.0–15.0)
Lymphocytes Relative: 31.3 % (ref 12.0–46.0)
Lymphs Abs: 1.6 10*3/uL (ref 0.7–4.0)
MCHC: 32.6 g/dL (ref 30.0–36.0)
MCV: 89.2 fl (ref 78.0–100.0)
Monocytes Absolute: 0.3 10*3/uL (ref 0.1–1.0)
Monocytes Relative: 6.1 % (ref 3.0–12.0)
Neutro Abs: 3 10*3/uL (ref 1.4–7.7)
Neutrophils Relative %: 59.1 % (ref 43.0–77.0)
Platelets: 148 10*3/uL — ABNORMAL LOW (ref 150.0–400.0)
RBC: 4.68 Mil/uL (ref 3.87–5.11)
RDW: 13.6 % (ref 11.5–15.5)
WBC: 5.1 10*3/uL (ref 4.0–10.5)

## 2023-02-12 LAB — LIPASE: Lipase: 30 U/L (ref 11.0–59.0)

## 2023-02-13 LAB — URINE CULTURE
MICRO NUMBER:: 15044748
Result:: NO GROWTH
SPECIMEN QUALITY:: ADEQUATE

## 2023-02-17 LAB — H. PYLORI BREATH TEST: H. pylori Breath Test: NOT DETECTED

## 2023-03-07 ENCOUNTER — Ambulatory Visit
Admission: RE | Admit: 2023-03-07 | Discharge: 2023-03-07 | Disposition: A | Discharge status: Home / Self Care | Payer: PRIVATE HEALTH INSURANCE | Source: Ambulatory Visit | Attending: Primary Care

## 2023-03-07 DIAGNOSIS — Z1231 Encounter for screening mammogram for malignant neoplasm of breast: Secondary | ICD-10-CM

## 2023-05-12 ENCOUNTER — Other Ambulatory Visit: Payer: Self-pay | Admitting: Primary Care

## 2023-05-12 DIAGNOSIS — I10 Essential (primary) hypertension: Secondary | ICD-10-CM

## 2023-05-12 DIAGNOSIS — F419 Anxiety disorder, unspecified: Secondary | ICD-10-CM

## 2023-05-12 NOTE — Telephone Encounter (Signed)
Patient is due for follow up, this will be required prior to any further refills.  Please schedule, thank you!    

## 2023-05-13 NOTE — Telephone Encounter (Signed)
Patient has been scheduled

## 2023-05-20 ENCOUNTER — Ambulatory Visit: Payer: BC Managed Care – PPO | Admitting: Podiatry

## 2023-05-22 ENCOUNTER — Encounter: Payer: Self-pay | Admitting: Primary Care

## 2023-05-22 ENCOUNTER — Ambulatory Visit (INDEPENDENT_AMBULATORY_CARE_PROVIDER_SITE_OTHER): Payer: PRIVATE HEALTH INSURANCE | Admitting: Primary Care

## 2023-05-22 VITALS — BP 126/80 | HR 60 | Temp 98.1°F | Ht 64.0 in | Wt 218.0 lb

## 2023-05-22 DIAGNOSIS — G479 Sleep disorder, unspecified: Secondary | ICD-10-CM

## 2023-05-22 DIAGNOSIS — M25512 Pain in left shoulder: Secondary | ICD-10-CM

## 2023-05-22 DIAGNOSIS — Z0000 Encounter for general adult medical examination without abnormal findings: Secondary | ICD-10-CM

## 2023-05-22 DIAGNOSIS — R7303 Prediabetes: Secondary | ICD-10-CM | POA: Diagnosis not present

## 2023-05-22 DIAGNOSIS — G43009 Migraine without aura, not intractable, without status migrainosus: Secondary | ICD-10-CM

## 2023-05-22 DIAGNOSIS — Z0001 Encounter for general adult medical examination with abnormal findings: Secondary | ICD-10-CM | POA: Diagnosis not present

## 2023-05-22 DIAGNOSIS — Z1211 Encounter for screening for malignant neoplasm of colon: Secondary | ICD-10-CM

## 2023-05-22 DIAGNOSIS — G8929 Other chronic pain: Secondary | ICD-10-CM

## 2023-05-22 DIAGNOSIS — Z23 Encounter for immunization: Secondary | ICD-10-CM

## 2023-05-22 DIAGNOSIS — I10 Essential (primary) hypertension: Secondary | ICD-10-CM

## 2023-05-22 DIAGNOSIS — F419 Anxiety disorder, unspecified: Secondary | ICD-10-CM

## 2023-05-22 DIAGNOSIS — D696 Thrombocytopenia, unspecified: Secondary | ICD-10-CM | POA: Diagnosis not present

## 2023-05-22 DIAGNOSIS — K219 Gastro-esophageal reflux disease without esophagitis: Secondary | ICD-10-CM

## 2023-05-22 LAB — COMPREHENSIVE METABOLIC PANEL
ALT: 16 U/L (ref 0–35)
AST: 18 U/L (ref 0–37)
Albumin: 4.1 g/dL (ref 3.5–5.2)
Alkaline Phosphatase: 56 U/L (ref 39–117)
BUN: 13 mg/dL (ref 6–23)
CO2: 31 meq/L (ref 19–32)
Calcium: 9.3 mg/dL (ref 8.4–10.5)
Chloride: 103 meq/L (ref 96–112)
Creatinine, Ser: 0.76 mg/dL (ref 0.40–1.20)
GFR: 90.74 mL/min (ref >60.00)
Glucose, Bld: 86 mg/dL (ref 70–99)
Potassium: 3.8 meq/L (ref 3.5–5.1)
Sodium: 142 meq/L (ref 135–145)
Total Bilirubin: 0.8 mg/dL (ref 0.2–1.2)
Total Protein: 7 g/dL (ref 6.0–8.3)

## 2023-05-22 LAB — LIPID PANEL
Cholesterol: 176 mg/dL (ref 0–200)
HDL: 49.6 mg/dL (ref >39.00)
LDL Cholesterol: 108 mg/dL — ABNORMAL HIGH (ref 0–99)
NonHDL: 126.67
Total CHOL/HDL Ratio: 4
Triglycerides: 95 mg/dL (ref 0.0–149.0)
VLDL: 19 mg/dL (ref 0.0–40.0)

## 2023-05-22 LAB — CBC
HCT: 41.4 % (ref 36.0–46.0)
Hemoglobin: 13.4 g/dL (ref 12.0–15.0)
MCHC: 32.3 g/dL (ref 30.0–36.0)
MCV: 90.3 fl (ref 78.0–100.0)
Platelets: 143 10*3/uL — ABNORMAL LOW (ref 150.0–400.0)
RBC: 4.59 Mil/uL (ref 3.87–5.11)
RDW: 13.8 % (ref 11.5–15.5)
WBC: 5.8 10*3/uL (ref 4.0–10.5)

## 2023-05-22 LAB — IBC + FERRITIN
Ferritin: 64.5 ng/mL (ref 10.0–291.0)
Iron: 84 ug/dL (ref 42–145)
Saturation Ratios: 21.9 % (ref 20.0–50.0)
TIBC: 383.6 ug/dL (ref 250.0–450.0)
Transferrin: 274 mg/dL (ref 212.0–360.0)

## 2023-05-22 LAB — HEMOGLOBIN A1C: Hgb A1c MFr Bld: 6 % (ref 4.6–6.5)

## 2023-05-22 LAB — TSH: TSH: 1.4 u[IU]/mL (ref 0.35–5.50)

## 2023-05-22 NOTE — Assessment & Plan Note (Signed)
Stable.  Controlled. Continue to monitor.

## 2023-05-22 NOTE — Assessment & Plan Note (Signed)
Controlled. ? ?Continue omeprazole 20 mg as needed ?

## 2023-05-22 NOTE — Assessment & Plan Note (Signed)
Continued.  Has yet to see sports medicine. Recommended sports medicine evaluation.

## 2023-05-22 NOTE — Assessment & Plan Note (Signed)
Immunizations UTD. Influenza vaccine provided today. Pap smear due.  Offered today but she declines. Mammogram up-to-date. Colonoscopy overdue, she agrees to complete.  Referral placed to GI  Discussed the importance of a healthy diet and regular exercise in order for weight loss, and to reduce the risk of further co-morbidity.  Exam stable. Labs pending.  Follow up in 1 year for repeat physical.

## 2023-05-22 NOTE — Progress Notes (Signed)
Subjective:    Patient ID: Karina Neal, female    DOB: [birth year], 52 y.o.   MRN: [id number]  HPI  Karina Neal is a very pleasant 52 y.o. female who presents today for complete physical and follow up of chronic conditions.  Chronic for the last 3-4 months. She would also like to discuss sleep disturbance. She goes to bed around 10 pm each night. She will lay in bed for 2-3 hours before falling asleep, will wake up again 4-5 am. She will feel tired during the day. She does not nap during the day. She does not watch TV or look at a screen prior to bed. She denies caffeine consumption in the evening. She has been told that she snores. She does have hot flashes during the day and night, but not every night. She is managed on sertraline 25 mg daily which has helped with anxiety and depression.   Immunizations: -Tetanus: Completed in 2017 -Influenza: Influenza vaccine provided today. -Shingles: Completed Shingrix series  Diet: Fair diet.  Exercise: No regular exercise.  Eye exam: Completes annually  Dental exam: Completes semi-annually    Pap Smear: Completed in 2021, due Mammogram: July 2024  Colonoscopy: Never completed  BP Readings from Last 3 Encounters:  05/22/23 126/80  02/12/23 122/84  01/23/23 132/86      Review of Systems  Constitutional:  Positive for fatigue. Negative for unexpected weight change.  HENT:  Negative for rhinorrhea.   Respiratory:  Negative for cough and shortness of breath.   Cardiovascular:  Negative for chest pain.  Gastrointestinal:  Negative for constipation and diarrhea.  Genitourinary:  Negative for difficulty urinating.  Musculoskeletal:  Positive for arthralgias.  Skin:  Negative for rash.  Allergic/Immunologic: Negative for environmental allergies.  Neurological:  Negative for dizziness, numbness and headaches.  Psychiatric/Behavioral:  Positive for sleep disturbance. The patient is not nervous/anxious.          Past Medical History:   Diagnosis Date   Asthma    Blood pressure elevated    Chicken pox    Constrictive jewelry of finger 10/31/2020   Frequent headaches    Hypertension    Left sided numbness 04/23/2015    Social History   Socioeconomic History   Marital status: Married    Spouse name: Not on file   Number of children: Not on file   Years of education: Not on file   Highest education level: Not on file  Occupational History   Not on file  Tobacco Use   Smoking status: Never   Smokeless tobacco: Never  Substance and Sexual Activity   Alcohol use: Yes    Comment: social   Drug use: No   Sexual activity: Not on file  Other Topics Concern   Not on file  Social History Narrative   ** Merged History Encounter **       ** Merged History Encounter **       Social Determinants of Health   Financial Resource Strain: Not on file  Food Insecurity: Food Insecurity Present (10/12/2020)   [facility]   Hunger Vital Sign    Worried About Running Out of Food in the Last Year: Sometimes true    Ran Out of Food in the Last Year: Never true  Transportation Needs: Not on file  Physical Activity: Not on file  Stress: Not on file  Social Connections: Not on file  Intimate Partner Violence: Not on file  Past Surgical History:  Procedure Laterality Date   APPENDECTOMY     CESAREAN SECTION     CESAREAN SECTION     EYE SURGERY      Family History  Problem Relation Age of Onset   Hyperlipidemia Mother    Hypertension Mother    Hyperlipidemia Father    Hypertension Father    Diabetes Father    Hypertension Sister    Hypertension Brother    Breast cancer Neg Hx     Allergies  Allergen Reactions   Shellfish Allergy Anaphylaxis    Throat swelling, dyspnea   Penicillins Hives    Has patient had a PCN reaction causing immediate rash, facial/tongue/throat swelling, SOB or lightheadedness with hypotension: Unknown Has patient had a PCN reaction causing severe  rash involving mucus membranes or skin necrosis: No Has patient had a PCN reaction that required hospitalization: No Has patient had a PCN reaction occurring within the last 10 years: No If all of the above answers are "NO", then may proceed with Cephalosporin use.     Current Outpatient Medications on File Prior to Visit  Medication Sig Dispense Refill   cholecalciferol (VITAMIN D3) 25 MCG (1000 UNIT) tablet Take 1,000 Units by mouth daily.     fluticasone (FLONASE) 50 MCG/ACT nasal spray PLACE 1 SPRAY INTO BOTH NOSTRILS 2 (TWO) TIMES DAILY 48 mL 1   hydrochlorothiazide (HYDRODIURIL) 12.5 MG tablet TAKE 1 TABLET (12.5 MG TOTAL) BY MOUTH DAILY FOR BLOOD PRESSURE 90 tablet 0   hydrOXYzine (ATARAX) 10 MG tablet Take 1-2 tablets (10-20 mg total) by mouth at bedtime as needed for anxiety. 60 tablet 0   omeprazole (PRILOSEC) 20 MG capsule Take 1 capsule (20 mg total) by mouth every evening. For cough. 90 capsule 2   sertraline (ZOLOFT) 25 MG tablet TAKE 1 TABLET (25 MG TOTAL) BY MOUTH DAILY. FOR ANXIETY 90 tablet 0   vitamin B-12 (CYANOCOBALAMIN) 100 MCG tablet Take 100 mcg by mouth daily.     cyclobenzaprine (FLEXERIL) 5 MG tablet Take 1 tablet (5 mg total) by mouth 3 (three) times daily as needed for muscle spasms. (Patient not taking: Reported on 05/22/2023) 15 tablet 0   No current facility-administered medications on file prior to visit.    BP 126/80   Pulse 60   Temp 98.1 F (36.7 C) (Temporal)   Ht 5\' 4"  (1.626 m)   Wt 218 lb (98.9 kg)   LMP 11/08/2022 (Approximate)   SpO2 97%   BMI 37.42 kg/m  Objective:   Physical Exam HENT:     Right Ear: Tympanic membrane and ear canal normal.     Left Ear: Tympanic membrane and ear canal normal.     Nose: Nose normal.  Eyes:     Conjunctiva/sclera: Conjunctivae normal.     Pupils: Pupils are equal, round, and reactive to light.  Neck:     Thyroid: No thyromegaly.  Cardiovascular:     Rate and Rhythm: Normal rate and regular rhythm.      Heart sounds: No murmur heard. Pulmonary:     Effort: Pulmonary effort is normal.     Breath sounds: Normal breath sounds. No rales.  Abdominal:     General: Bowel sounds are normal.     Palpations: Abdomen is soft.     Tenderness: There is no abdominal tenderness.  Musculoskeletal:        General: Normal range of motion.     Cervical back: Neck supple.  Lymphadenopathy:  Cervical: No cervical adenopathy.  Skin:    General: Skin is warm and dry.     Findings: No rash.  Neurological:     Mental Status: She is alert and oriented to person, place, and time.     Cranial Nerves: No cranial nerve deficit.     Deep Tendon Reflexes: Reflexes are normal and symmetric.  Psychiatric:        Mood and Affect: Mood normal.           Assessment & Plan:  Preventative health care Assessment & Plan: Immunizations UTD. Influenza vaccine provided today. Pap smear due.  Offered today but she declines. Mammogram up-to-date. Colonoscopy overdue, she agrees to complete.  Referral placed to GI  Discussed the importance of a healthy diet and regular exercise in order for weight loss, and to reduce the risk of further co-morbidity.  Exam stable. Labs pending.  Follow up in 1 year for repeat physical.    Migraine without aura and without status migrainosus, not intractable Assessment & Plan: Stable.  Controlled. Continue to monitor.    Essential hypertension Assessment & Plan: Controlled.  Continue hydrochlorothiazide 12.5 mg daily. CMP pending.   Gastroesophageal reflux disease, unspecified whether esophagitis present Assessment & Plan: Controlled.  Continue omeprazole 20 mg as needed.   Thrombocytopenia (HCC) Assessment & Plan: Repeat CBC pending.  Orders: -     CBC -     Comprehensive metabolic panel -     IBC + Ferritin  Chronic left shoulder pain Assessment & Plan: Continued.  Has yet to see sports medicine. Recommended sports medicine  evaluation.    Sleep disturbance Assessment & Plan: Differentials include sleep apnea, metabolic cause, perimenopause.  Labs pending today.  Discussed melatonin 5 to 10 mg at bedtime.  Stop bang score of 4 today. Consider sleep study. She will update if symptoms persist.   Orders: -     TSH  Prediabetes Assessment & Plan: Repeat A1C pending.  Discussed the importance of a healthy diet and regular exercise in order for weight loss, and to reduce the risk of further co-morbidity.   Orders: -     Hemoglobin A1c -     Lipid panel  Anxiety Assessment & Plan: Improved.  Continue sertraline 25 mg daily. Offered to increase dose to 50 mg to see if this would help with sleeping, she kindly declines but will update.   Screening for colon cancer -     Ambulatory referral to Gastroenterology  Encounter for immunization -     Flu vaccine trivalent PF, 6mos and older(Flulaval,Afluria,Fluarix,Fluzone)        Doreene Nest, NP

## 2023-05-22 NOTE — Assessment & Plan Note (Signed)
Controlled.  Continue hydrochlorothiazide 12.5 mg daily.  CMP pending. 

## 2023-05-22 NOTE — Assessment & Plan Note (Signed)
Repeat A1C pending.  Discussed the importance of a healthy diet and regular exercise in order for weight loss, and to reduce the risk of further co-morbidity.  

## 2023-05-22 NOTE — Patient Instructions (Addendum)
Stop by the lab prior to leaving today. I will notify you of your results once received.   Try melatonin 5 to 10 mg at bedtime to help with sleep.  You will either be contacted via phone regarding your referral to GI for the colonoscopy, or you may receive a letter on your MyChart portal from our referral team with instructions for scheduling an appointment. Please let us know if you have not been contacted by anyone within two weeks.  It was a pleasure to see you today!

## 2023-05-22 NOTE — Assessment & Plan Note (Addendum)
Differentials include sleep apnea, metabolic cause, perimenopause.  Labs pending today.  Discussed melatonin 5 to 10 mg at bedtime.  Stop bang score of 4 today. Consider sleep study. She will update if symptoms persist.

## 2023-05-22 NOTE — Assessment & Plan Note (Signed)
Improved.  Continue sertraline 25 mg daily. Offered to increase dose to 50 mg to see if this would help with sleeping, she kindly declines but will update.

## 2023-05-22 NOTE — Assessment & Plan Note (Signed)
Repeat CBC pending. 

## 2023-05-27 ENCOUNTER — Other Ambulatory Visit: Payer: Self-pay | Admitting: Primary Care

## 2023-05-27 DIAGNOSIS — I10 Essential (primary) hypertension: Secondary | ICD-10-CM

## 2023-05-27 DIAGNOSIS — F419 Anxiety disorder, unspecified: Secondary | ICD-10-CM

## 2023-05-27 MED ORDER — HYDROXYZINE HCL 10 MG PO TABS: 10.0000 mg | ORAL_TABLET | Freq: Every evening | ORAL | 0 refills | Status: DC | PRN

## 2023-05-29 ENCOUNTER — Other Ambulatory Visit: Payer: Self-pay | Admitting: Primary Care

## 2023-05-29 DIAGNOSIS — F419 Anxiety disorder, unspecified: Secondary | ICD-10-CM

## 2023-06-03 ENCOUNTER — Encounter: Payer: Self-pay | Admitting: *Deleted

## 2023-06-23 ENCOUNTER — Other Ambulatory Visit: Payer: Self-pay | Admitting: Primary Care

## 2023-06-23 DIAGNOSIS — F419 Anxiety disorder, unspecified: Secondary | ICD-10-CM

## 2023-06-24 NOTE — Telephone Encounter (Signed)
Called and spoke with patient she states she is using it twice a week, one pill at a time. She has about 5 tablets left.

## 2023-06-24 NOTE — Telephone Encounter (Signed)
Please call patient:  Received refill request for hydroxyzine that I prescribed for her to use as needed for anxiety. Is she out? How often is she taking? How many pills at a time? 1 or 2?

## 2023-06-24 NOTE — Telephone Encounter (Signed)
Noted, refills sent to pharmacy.

## 2023-07-09 DIAGNOSIS — F419 Anxiety disorder, unspecified: Secondary | ICD-10-CM

## 2023-07-10 MED ORDER — SERTRALINE HCL 50 MG PO TABS
50.0000 mg | ORAL_TABLET | Freq: Every day | ORAL | 0 refills | Status: DC
Start: 2023-07-10 — End: 2023-10-07

## 2023-07-15 ENCOUNTER — Telehealth: Payer: Self-pay | Admitting: Primary Care

## 2023-07-15 ENCOUNTER — Ambulatory Visit (INDEPENDENT_AMBULATORY_CARE_PROVIDER_SITE_OTHER): Payer: PRIVATE HEALTH INSURANCE | Admitting: Internal Medicine

## 2023-07-15 ENCOUNTER — Encounter: Payer: Self-pay | Admitting: Internal Medicine

## 2023-07-15 VITALS — BP 126/80 | HR 83 | Temp 98.5°F | Ht 64.0 in | Wt 220.0 lb

## 2023-07-15 DIAGNOSIS — M5431 Sciatica, right side: Secondary | ICD-10-CM | POA: Insufficient documentation

## 2023-07-15 DIAGNOSIS — M25512 Pain in left shoulder: Secondary | ICD-10-CM

## 2023-07-15 DIAGNOSIS — M5441 Lumbago with sciatica, right side: Secondary | ICD-10-CM

## 2023-07-15 MED ORDER — PREDNISONE 20 MG PO TABS: 40.0000 mg | ORAL_TABLET | Freq: Every day | ORAL | 0 refills | Status: DC

## 2023-07-15 MED ORDER — CYCLOBENZAPRINE HCL 5 MG PO TABS
5.0000 mg | ORAL_TABLET | Freq: Three times a day (TID) | ORAL | 0 refills | Status: DC | PRN
Start: 2023-07-15 — End: 2024-06-28

## 2023-07-15 NOTE — Progress Notes (Signed)
Subjective:    Patient ID: Karina Neal, female    DOB: Dec 17, 1970, 52 y.o.   MRN: [id number]  HPI Here with mom due to severe back pain last night  Was sweeping last night--turned to the left and just fell to the floor-- 8:30PM (cleaning at 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     fluticasone (FLONASE) 50 MCG/ACT nasal spray PLACE 1 SPRAY INTO BOTH NOSTRILS 2 (TWO) TIMES DAILY 48 mL 1   hydrochlorothiazide (HYDRODIURIL) 12.5 MG tablet TAKE 1 TABLET (12.5 MG TOTAL) BY MOUTH DAILY FOR BLOOD PRESSURE 90 tablet 0   hydrOXYzine (ATARAX) 10 MG tablet TAKE 1-2 TABLETS (10-20 MG TOTAL) BY MOUTH AT BEDTIME AS NEEDED FOR ANXIETY. 180 tablet 0   omeprazole (PRILOSEC) 20 MG capsule Take 1 capsule (20 mg total) by mouth every evening. For cough. 90 capsule 2   sertraline (ZOLOFT) 50 MG tablet Take 1 tablet (50 mg total) by mouth daily. For anxiety 90 tablet 0   vitamin B-12 (CYANOCOBALAMIN) 100 MCG tablet Take 100 mcg by mouth daily.     cyclobenzaprine (FLEXERIL) 5 MG tablet Take 1 tablet (5 mg total) by mouth 3 (three) times daily as needed for muscle spasms. (Patient not taking: Reported on 05/22/2023) 15 tablet 0   No current facility-administered medications on file prior to visit.    Allergies  Allergen Reactions   Shellfish Allergy Anaphylaxis    Throat swelling, dyspnea   Penicillins Hives    Has patient had a PCN reaction causing immediate rash,  facial/tongue/throat swelling, SOB or lightheadedness with hypotension: Unknown Has patient had a PCN reaction causing severe rash involving mucus membranes or skin necrosis: No Has patient had a PCN reaction that required hospitalization: No Has patient had a PCN reaction occurring within the last 10 years: No If all of the above answers are "NO", then may proceed with Cephalosporin use.     Past Medical History:  Diagnosis Date   Asthma    Blood pressure elevated    Chicken pox    Constrictive jewelry of finger 10/31/2020   Frequent headaches    Hypertension    Left sided numbness 04/23/2015    Past Surgical History:  Procedure Laterality Date   APPENDECTOMY     CESAREAN SECTION     CESAREAN SECTION     EYE SURGERY      Family History  Problem Relation Age of Onset   Hyperlipidemia Mother    Hypertension Mother    Hyperlipidemia Father    Hypertension Father    Diabetes Father    Hypertension Sister    Hypertension Brother    Breast cancer Neg Hx     Social History   Socioeconomic History   Marital status: Married    Spouse name: Not on file   Number of children: Not on file   Years of education: Not on file   Highest education level: Not on file  Occupational History   Not on file  Tobacco Use   Smoking status: Never   Smokeless tobacco: Never  Substance and Sexual Activity   Alcohol use: Yes    Comment: social   Drug use: No   Sexual activity: Not on file  Other Topics Concern   Not on file  Social History Narrative   ** Merged History Encounter **       ** Merged History Encounter **       Social Determinants of Health   Financial Resource Strain: Not on file  Food Insecurity: Food Insecurity Present (10/12/2020)   Received from [facility], [facility]   Hunger Vital Sign    Worried About Running Out of Food in the Last Year: Sometimes true    Ran Out of Food in the Last Year: Never true  Transportation Needs: Not on file  Physical  Activity: Not on file  Stress: Not on file  Social Connections: Not on file  Intimate Partner Violence: Not on file   Review of Systems No loss of bladder or bowel control Some tingling down right leg    Objective:   Physical Exam Musculoskeletal:     Comments: Tenderness in mid lumbar area on left.  No spine tenderness SLR painful on right--but not left ROM normal in hips  Neurological:     Comments: Stiff upon arising--then loosened up with improved gait. Still antalgic Mild pain related weakness in right leg            Assessment & Plan:

## 2023-07-15 NOTE — Assessment & Plan Note (Signed)
May have initial muscle injury but the radiation suggests sciatic nerve involvement Discussed ibuprofen 800mg  tid for a few days ---take omeprazole Refill flexeril--up to 10mg  three times a day--with caution Prednisone 40mg  x 3, 20mg  x 3 Heat--keep mobile OOW till 11/8--note given Should report injury

## 2023-07-15 NOTE — Telephone Encounter (Signed)
Called and spoke to Karina Neal at the pharmacy after speaking to Dr Alphonsus Sias to clarify the quantity. It is supposed to be #9. They will get the rx ready. I called pt to let her know.

## 2023-07-15 NOTE — Telephone Encounter (Signed)
Patient called in and stated she seen DR. Letvak today. She stated that the pharmacy will not fill her predniSONE (DELTASONE) 20 MG tablet. They stated that they are needing clarification on the instructions on the medication. Thank you!

## 2023-09-23 IMAGING — DX DG CHEST 2V
2 series · 2 of 2 positions shown · non-contrast
Comparison: 11/15/2017

CLINICAL DATA: Cough and flu symptoms for 3 days.  Chest pain.

EXAM:
CHEST - 2 VIEW

[chest pa]
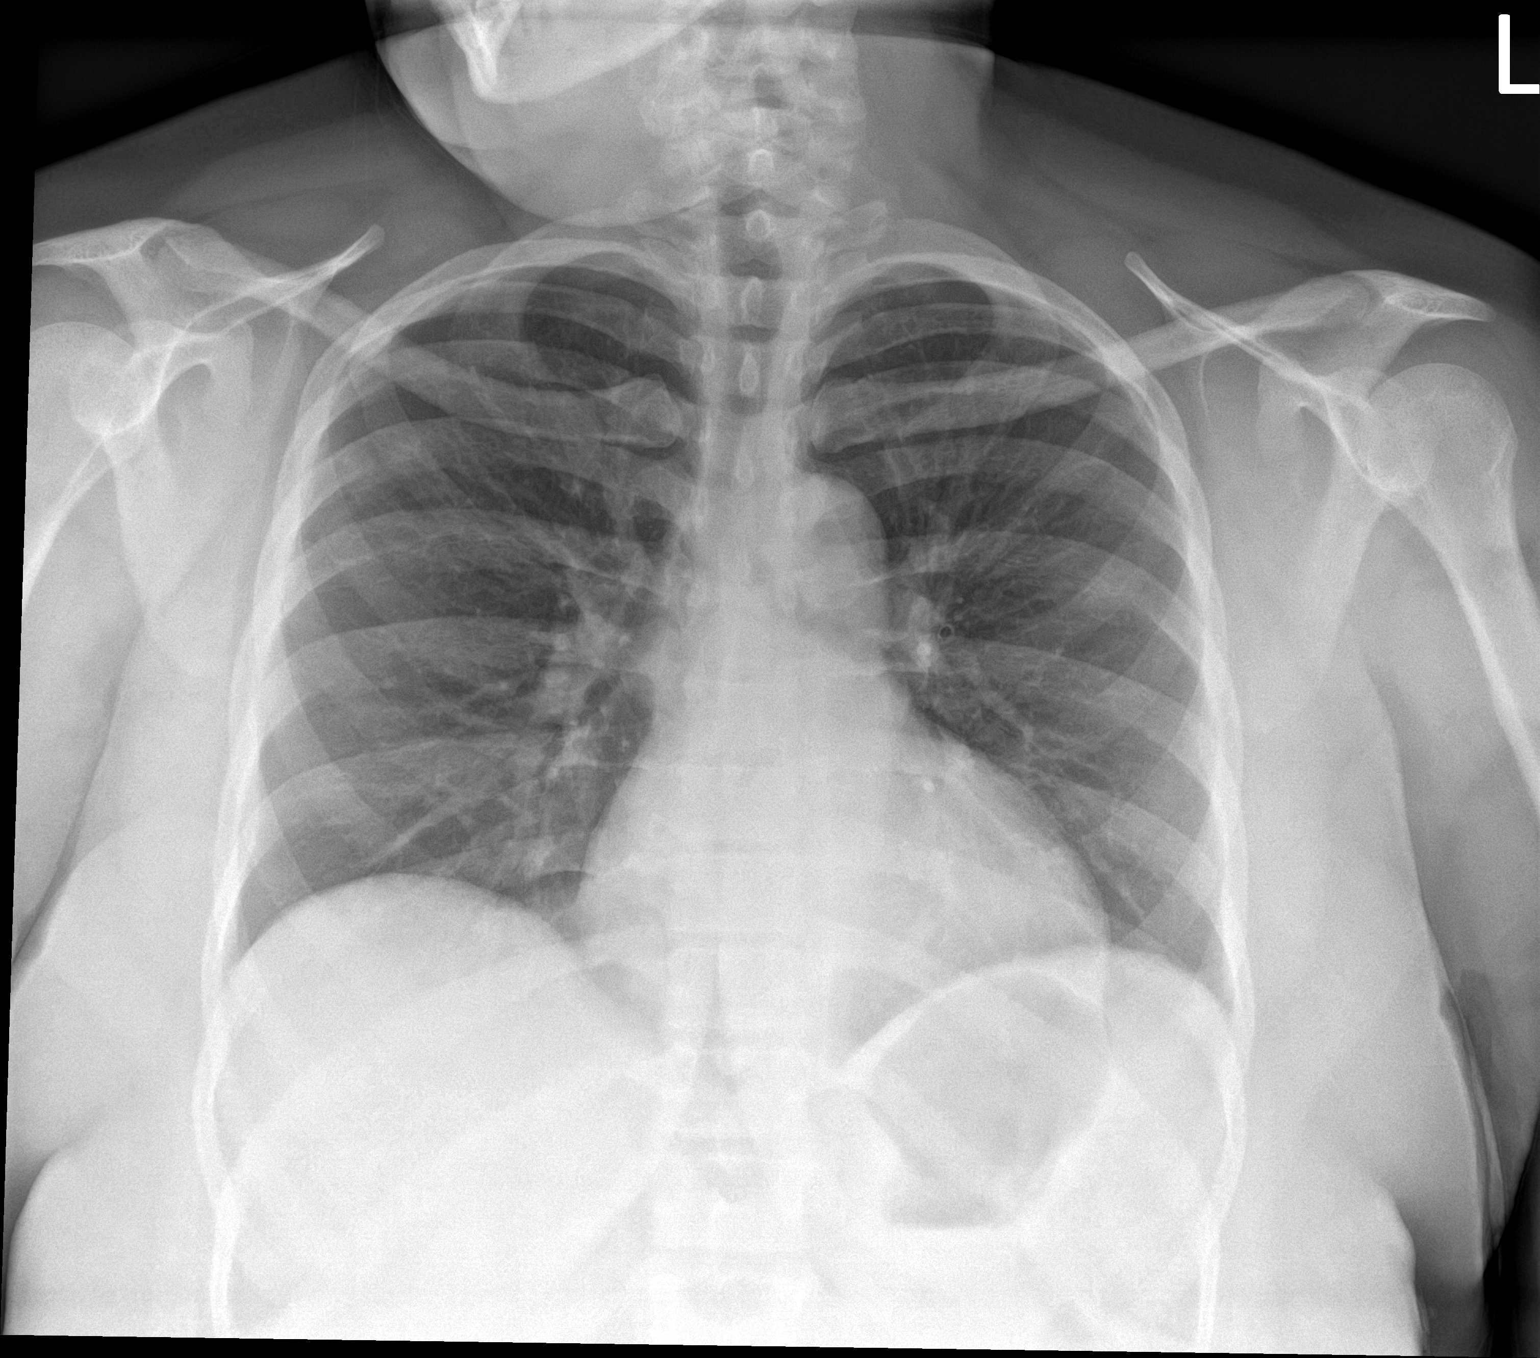

[chest lat]
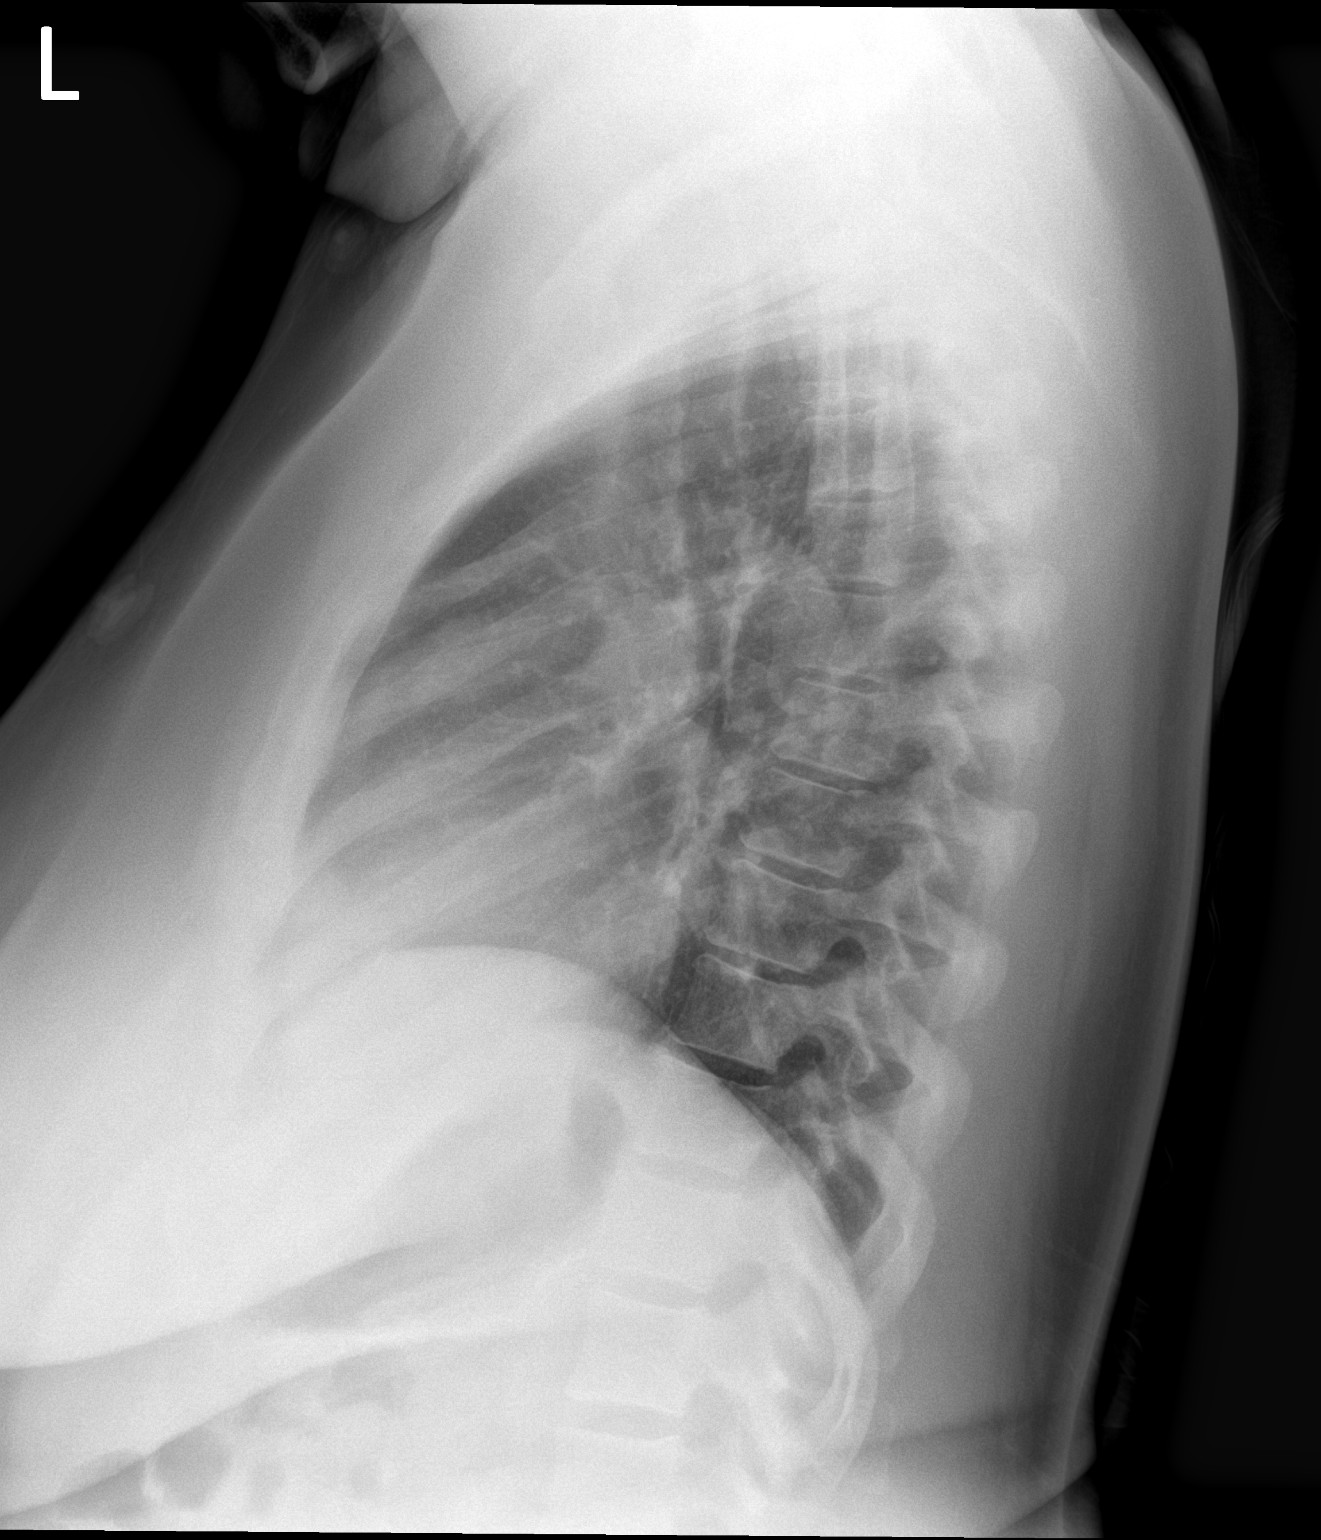

[2 of 2 positions shown; findings below may reference images not displayed]

FINDINGS: The heart size and mediastinal contours are within normal limits.
Both lungs are clear. The visualized skeletal structures are
unremarkable.
IMPRESSION: No active cardiopulmonary disease.

## 2023-09-30 ENCOUNTER — Ambulatory Visit: Payer: PRIVATE HEALTH INSURANCE | Admitting: Primary Care

## 2023-09-30 VITALS — BP 126/80 | HR 75 | Temp 97.3°F | Ht 64.0 in | Wt 223.0 lb

## 2023-09-30 DIAGNOSIS — M79641 Pain in right hand: Secondary | ICD-10-CM | POA: Insufficient documentation

## 2023-09-30 LAB — URIC ACID: Uric Acid, Serum: 6.1 mg/dL (ref 2.4–7.0)

## 2023-09-30 LAB — CBC WITH DIFFERENTIAL/PLATELET
Basophils Absolute: 0 10*3/uL (ref 0.0–0.1)
Basophils Relative: 0.6 % (ref 0.0–3.0)
Eosinophils Absolute: 0.2 10*3/uL (ref 0.0–0.7)
Eosinophils Relative: 2.7 % (ref 0.0–5.0)
HCT: 42 % (ref 36.0–46.0)
Hemoglobin: 13.7 g/dL (ref 12.0–15.0)
Lymphocytes Relative: 34.9 % (ref 12.0–46.0)
Lymphs Abs: 2.1 10*3/uL (ref 0.7–4.0)
MCHC: 32.7 g/dL (ref 30.0–36.0)
MCV: 89.7 fL (ref 78.0–100.0)
Monocytes Absolute: 0.5 10*3/uL (ref 0.1–1.0)
Monocytes Relative: 7.5 % (ref 3.0–12.0)
Neutro Abs: 3.3 10*3/uL (ref 1.4–7.7)
Neutrophils Relative %: 54.3 % (ref 43.0–77.0)
Platelets: 148 10*3/uL — ABNORMAL LOW (ref 150.0–400.0)
RBC: 4.68 Mil/uL (ref 3.87–5.11)
RDW: 13.7 % (ref 11.5–15.5)
WBC: 6.1 10*3/uL (ref 4.0–10.5)

## 2023-09-30 LAB — SEDIMENTATION RATE: Sed Rate: 21 mm/h (ref 0–30)

## 2023-09-30 LAB — C-REACTIVE PROTEIN: CRP: <1 mg/dL (ref 0.5–20.0)

## 2023-09-30 MED ORDER — KETOROLAC TROMETHAMINE 60 MG/2ML IM SOLN
60.0000 mg | Freq: Once | INTRAMUSCULAR | Status: AC
  Administered 2023-09-30: 60 mg via INTRAMUSCULAR

## 2023-09-30 MED ORDER — PREDNISONE 20 MG PO TABS: ORAL_TABLET | ORAL | 0 refills | Status: DC

## 2023-09-30 NOTE — Addendum Note (Signed)
Addended by: Lonia Blood on: 09/30/2023 12:10 PM   Modules accepted: Orders

## 2023-09-30 NOTE — Assessment & Plan Note (Signed)
Unclear etiology. Differentials include osteoarthritis, gout, carpal tunnel, autoimmune arthritis.  Checking labs today including CCP, RF, sed rate, CRP, CBC, uric acid.  Start prednisone tablets. Take two tablets my mouth once daily in the morning for four days, then one tablet once daily in the morning for four days.   IM Toradol 60 mg provided today. Await results.

## 2023-09-30 NOTE — Patient Instructions (Signed)
Stop by the lab prior to leaving today. I will notify you of your results once received.   Start prednisone tablets. Take two tablets my mouth once daily in the morning for four days, then one tablet once daily in the morning for four days.   It was a pleasure to see you today!

## 2023-09-30 NOTE — Progress Notes (Signed)
Subjective:    Patient ID: Karina Neal, female    DOB: [birth year], 53 y.o.   MRN: [id number]  Hand Pain     Karina Neal is a very pleasant 53 y.o. female with a history of lower back pain, right sided sciatica, CVA, paresthesias, prediabetes who presents today to discuss hand pain.  Symptom onset this morning with pain to the right first metacarpal joint with radiation down to the medial distal forearm. Her fingers to the right hand are painful with making a fist.   She describes her pain as burning with some tingling. She denies erythema, warmth, swelling, injury, overuse, red meat consumption, wine consumption, shellfish consumption.   She took Ibuprofen 800 mg this morning without improvement.   Review of Systems  Musculoskeletal:  Positive for arthralgias and joint swelling.  Skin:  Negative for color change.         Past Medical History:  Diagnosis Date   Asthma    Blood pressure elevated    Chicken pox    Constrictive jewelry of finger 10/31/2020   Frequent headaches    Hypertension    Left sided numbness 04/23/2015    Social History   Socioeconomic History   Marital status: Married    Spouse name: Not on file   Number of children: Not on file   Years of education: Not on file   Highest education level: 12th grade  Occupational History   Not on file  Tobacco Use   Smoking status: Never   Smokeless tobacco: Never  Substance and Sexual Activity   Alcohol use: Yes    Comment: social   Drug use: No   Sexual activity: Not on file  Other Topics Concern   Not on file  Social History Narrative   ** Merged History Encounter **       ** Merged History Encounter **       Social Drivers of Corporate investment banker Strain: Low Risk  (09/30/2023)   Overall Financial Resource Strain (CARDIA)    Difficulty of Paying Living Expenses: Not hard at all  Food Insecurity: No Food Insecurity (09/30/2023)   Hunger Vital Sign    Worried About Running Out of Food  in the Last Year: Never true    Ran Out of Food in the Last Year: Never true  Transportation Needs: No Transportation Needs (09/30/2023)   PRAPARE - Administrator, Civil Service (Medical): No    Lack of Transportation (Non-Medical): No  Physical Activity: Sufficiently Active (09/30/2023)   Exercise Vital Sign    Days of Exercise per Week: 7 days    Minutes of Exercise per Session: 150+ min  Stress: Stress Concern Present (09/30/2023)   [employer] of Occupational Health - Occupational Stress Questionnaire    Feeling of Stress : Very much  Social Connections: Unknown (09/30/2023)   Social Connection and Isolation Panel [NHANES]    Frequency of Communication with Friends and Family: Twice a week    Frequency of Social Gatherings with Friends and Family: Once a week    Attends Religious Services: Patient declined    Database administrator or Organizations: Patient declined    Attends Engineer, structural: Not on file    Marital Status: Separated  Intimate Partner Violence: Not on file    Past Surgical History:  Procedure Laterality Date   APPENDECTOMY     CESAREAN SECTION     CESAREAN SECTION     EYE SURGERY  Family History  Problem Relation Age of Onset   Hyperlipidemia Mother    Hypertension Mother    Hyperlipidemia Father    Hypertension Father    Diabetes Father    Hypertension Sister    Hypertension Brother    Breast cancer Neg Hx     Allergies  Allergen Reactions   Shellfish Allergy Anaphylaxis    Throat swelling, dyspnea   Penicillins Hives    Has patient had a PCN reaction causing immediate rash, facial/tongue/throat swelling, SOB or lightheadedness with hypotension: Unknown Has patient had a PCN reaction causing severe rash involving mucus membranes or skin necrosis: No Has patient had a PCN reaction that required hospitalization: No Has patient had a PCN reaction occurring within the last 10 years: No If all of the above answers  are "NO", then may proceed with Cephalosporin use.     Current Outpatient Medications on File Prior to Visit  Medication Sig Dispense Refill   cholecalciferol (VITAMIN D3) 25 MCG (1000 UNIT) tablet Take 1,000 Units by mouth daily.     fluticasone (FLONASE) 50 MCG/ACT nasal spray PLACE 1 SPRAY INTO BOTH NOSTRILS 2 (TWO) TIMES DAILY 48 mL 1   hydrochlorothiazide (HYDRODIURIL) 12.5 MG tablet TAKE 1 TABLET (12.5 MG TOTAL) BY MOUTH DAILY FOR BLOOD PRESSURE 90 tablet 0   hydrOXYzine (ATARAX) 10 MG tablet TAKE 1-2 TABLETS (10-20 MG TOTAL) BY MOUTH AT BEDTIME AS NEEDED FOR ANXIETY. 180 tablet 0   omeprazole (PRILOSEC) 20 MG capsule Take 1 capsule (20 mg total) by mouth every evening. For cough. 90 capsule 2   sertraline (ZOLOFT) 50 MG tablet Take 1 tablet (50 mg total) by mouth daily. For anxiety 90 tablet 0   vitamin B-12 (CYANOCOBALAMIN) 100 MCG tablet Take 100 mcg by mouth daily.     cyclobenzaprine (FLEXERIL) 5 MG tablet Take 1-2 tablets (5-10 mg total) by mouth 3 (three) times daily as needed for muscle spasms. (Patient not taking: Reported on 09/30/2023) 30 tablet 0   No current facility-administered medications on file prior to visit.    BP 126/80   Pulse 75   Temp (!) 97.3 F (36.3 C) (Temporal)   Ht 5\' 4"  (1.626 m)   Wt 223 lb (101.2 kg)   SpO2 98%   BMI 38.28 kg/m  Objective:   Physical Exam Cardiovascular:     Rate and Rhythm: Normal rate.  Pulmonary:     Effort: Pulmonary effort is normal.     Breath sounds: Normal breath sounds.  Musculoskeletal:     Right hand: Bony tenderness present. No swelling. Decreased range of motion. Normal strength. Normal pulse.     Comments: Pain with flexion and extension of right first metacarpal joint. Tenderness to radial side of right distal forearm.   Skin:    General: Skin is warm and dry.     Findings: No erythema.           Assessment & Plan:  Pain of right hand Assessment & Plan: Unclear etiology. Differentials include  osteoarthritis, gout, carpal tunnel, autoimmune arthritis.  Checking labs today including CCP, RF, sed rate, CRP, CBC, uric acid.  Start prednisone tablets. Take two tablets my mouth once daily in the morning for four days, then one tablet once daily in the morning for four days.   IM Toradol 60 mg provided today. Await results.   Orders: -     Uric acid -     CBC with Differential/Platelet -     C-reactive protein -  Rheumatoid factor -     Sedimentation rate -     Cyclic citrul peptide antibody, IgG -     predniSONE; Take two tablets my mouth once daily in the morning for four days, then one tablet once daily in the morning for four days.  Dispense: 12 tablet; Refill: 0        Doreene Nest, NP

## 2023-10-05 LAB — RHEUMATOID FACTOR: Rheumatoid fact SerPl-aCnc: <10 [IU]/mL (ref <14)

## 2023-10-05 LAB — CYCLIC CITRUL PEPTIDE ANTIBODY, IGG: Cyclic Citrullin Peptide Ab: <16 U

## 2023-10-06 ENCOUNTER — Encounter: Payer: Self-pay | Admitting: Emergency Medicine

## 2023-10-06 ENCOUNTER — Ambulatory Visit (INDEPENDENT_AMBULATORY_CARE_PROVIDER_SITE_OTHER): Payer: PRIVATE HEALTH INSURANCE

## 2023-10-06 ENCOUNTER — Other Ambulatory Visit: Payer: Self-pay

## 2023-10-06 ENCOUNTER — Ambulatory Visit
Admission: EM | Admit: 2023-10-06 | Discharge: 2023-10-06 | Disposition: A | Discharge status: Home / Self Care | Payer: PRIVATE HEALTH INSURANCE | Attending: Emergency Medicine | Admitting: Emergency Medicine

## 2023-10-06 DIAGNOSIS — M79641 Pain in right hand: Secondary | ICD-10-CM

## 2023-10-06 NOTE — ED Provider Notes (Signed)
[name]    CSN: [id number] Arrival date & time: 10/06/23  1607      History   Chief Complaint Chief Complaint  Patient presents with   Hand Pain    HPI Karina Neal is a 53 y.o. female.   She presents for evaluation of constant right hand pain beginning at the right thumb radiating into the wrist present for 7 days.  Symptoms started abruptly without precipitating event, change in activity or injury.  Has been constant, associated tingling with limited range of motion.  Has been evaluated by PCP who has completed blood work and started patient on prednisone but however has seen no improvement.  Additionally has attempted use of ibuprofen.  Past Medical History:  Diagnosis Date   Asthma    Blood pressure elevated    Chicken pox    Constrictive jewelry of finger 10/31/2020   Frequent headaches    Hypertension    Left sided numbness 04/23/2015    Patient Active Problem List   Diagnosis Date Noted   Pain of right hand 09/30/2023   Sciatica, right side 07/15/2023   Acute midline low back pain with right-sided sciatica 01/23/2023   Chronic shoulder pain 01/23/2023   Abnormal uterine bleeding 08/27/2022   Anxiety 08/27/2022   Prediabetes 03/05/2022   Vitamin D deficiency 03/05/2022   Increased thirst 01/17/2022   Sleep disturbance 01/17/2022   Paresthesia 01/17/2022   RUE weakness 01/17/2022   Obesity, Class II, BMI 35-39.9 12/18/2021   Chronic heel pain 05/10/2021   Thrombocytopenia (HCC) 03/27/2021   Essential hypertension 03/02/2021   GERD (gastroesophageal reflux disease) 01/02/2021   Preventative health care 04/10/2016   Migraines    History of stroke 04/23/2015    Past Surgical History:  Procedure Laterality Date   APPENDECTOMY     CESAREAN SECTION     CESAREAN SECTION     EYE SURGERY      OB History   No obstetric history on file.      Home Medications    Prior to Admission medications   Medication Sig Start Date End Date Taking?  Authorizing Provider  cholecalciferol (VITAMIN D3) 25 MCG (1000 UNIT) tablet Take 1,000 Units by mouth daily.    [provider]  cyclobenzaprine (FLEXERIL) 5 MG tablet Take 1-2 tablets (5-10 mg total) by mouth 3 (three) times daily as needed for muscle spasms. Patient not taking: Reported on 09/30/2023 07/15/23   Karie Schwalbe, MD  fluticasone [facility]) 50 MCG/ACT nasal spray PLACE 1 SPRAY INTO BOTH NOSTRILS 2 (TWO) TIMES DAILY 06/17/22   Doreene Nest, NP  hydrochlorothiazide (HYDRODIURIL) 12.5 MG tablet TAKE 1 TABLET (12.5 MG TOTAL) BY MOUTH DAILY FOR BLOOD PRESSURE 05/12/23   Doreene Nest, NP  hydrOXYzine (ATARAX) 10 MG tablet TAKE 1-2 TABLETS (10-20 MG TOTAL) BY MOUTH AT BEDTIME AS NEEDED FOR ANXIETY. 06/24/23   Doreene Nest, NP  omeprazole (PRILOSEC) 20 MG capsule Take 1 capsule (20 mg total) by mouth every evening. For cough. 06/15/22   Doreene Nest, NP  predniSONE (DELTASONE) 20 MG tablet Take two tablets my mouth once daily in the morning for four days, then one tablet once daily in the morning for four days. 09/30/23   Doreene Nest, NP  sertraline (ZOLOFT) 50 MG tablet Take 1 tablet (50 mg total) by mouth daily. For anxiety 07/10/23   Doreene Nest, NP  vitamin B-12 (CYANOCOBALAMIN) 100 MCG tablet Take 100 mcg by mouth daily.  [provider]    Family History Family History  Problem Relation Age of Onset   Hyperlipidemia Mother    Hypertension Mother    Hyperlipidemia Father    Hypertension Father    Diabetes Father    Hypertension Sister    Hypertension Brother    Breast cancer Neg Hx     Social History Social History   Tobacco Use   Smoking status: Never   Smokeless tobacco: Never  Vaping Use   Vaping status: Never Used  Substance Use Topics   Alcohol use: Yes    Comment: social   Drug use: No     Allergies   Shellfish allergy and Penicillins   Review of Systems Review of Systems   Physical Exam Triage  Vital Signs ED Triage Vitals  Encounter Vitals Group     BP 10/06/23 1714 122/77     Systolic BP Percentile --      Diastolic BP Percentile --      Pulse Rate 10/06/23 1714 76     Resp 10/06/23 1714 18     Temp 10/06/23 1714 98.9 F (37.2 C)     Temp Source 10/06/23 1714 Oral     SpO2 10/06/23 1714 94 %     Weight --      Height --      Head Circumference --      Peak Flow --      Pain Score 10/06/23 1709 9     Pain Loc --      Pain Education --      Exclude from Growth Chart --    No data found.  Updated Vital Signs BP 122/77 (BP Location: Left Arm)   Pulse 76   Temp 98.9 F (37.2 C) (Oral)   Resp 18   SpO2 94%   Visual Acuity Right Eye Distance:   Left Eye Distance:   Bilateral Distance:    Right Eye Near:   Left Eye Near:    Bilateral Near:     Physical Exam Constitutional:      Appearance: Normal appearance.  Eyes:     Extraocular Movements: Extraocular movements intact.  Pulmonary:     Effort: Pulmonary effort is normal.  Musculoskeletal:     Comments: Tenderness extending from the middle phalanx of the right thumb until the radial aspect of the right wrist without point tenderness, no ecchymosis swelling or deformity, limitation has normal range of motion unable to fully flex the thumb but able to fully extend, can complete full range of motion of the right wrist however pain is elicited with all movements, 2+ radial pulse  Neurological:     Mental Status: She is alert and oriented to person, place, and time. Mental status is at baseline.      UC Treatments / Results  Labs (all labs ordered are listed, but only abnormal results are displayed) Labs Reviewed - No data to display  EKG   Radiology No results found.  Procedures Procedures (including critical care time)  Medications Ordered in UC Medications - No data to display  Initial Impression / Assessment and Plan / UC Course  I have reviewed the triage vital signs and the nursing  notes.  Pertinent labs & imaging results that were available during my care of the patient were reviewed by me and considered in my medical decision making (see chart for details).  Right hand pain  X-rays pending, will make adjustments to treatment plan based on results, discussed  this with patient, to notify via telephone, advised continuation of steroids as directed, applied wrist brace to be used as needed recommended RICE and given a walker referral to orthopedics for any worsening symptoms Final Clinical Impressions(s) / UC Diagnoses   Final diagnoses:  Right hand pain     Discharge Instructions      X-ray of the hand is pending, you will be notified of results via telephone for any changes are made to your treatment plan  Finish course of steroids as directed  You have been given a wrist brace for stability and support when completing activities, may wear as needed  You may use ice or heat over the affected area in 10 to 15-minute intervals  You may apply right hand and wrist onto pillows whenever sitting and lying  For worsening symptoms please follow-up with [facility] whom is a specialist, information listed on front page  For persisting symptoms you may follow-up with your primary doctor    ED Prescriptions   None    PDMP not reviewed this encounter.   Valinda Hoar, NP 10/06/23 1730

## 2023-10-06 NOTE — ED Triage Notes (Signed)
Complains of right hand/wrist pain.  Patient is right handed. No known injury.  Right hand and wrist is swollen.

## 2023-10-06 NOTE — Discharge Instructions (Addendum)
X-ray of the hand is pending, you will be notified of results via telephone for any changes are made to your treatment plan  Finish course of steroids as directed  You have been given a wrist brace for stability and support when completing activities, may wear as needed  You may use ice or heat over the affected area in 10 to 15-minute intervals  You may apply right hand and wrist onto pillows whenever sitting and lying  For worsening symptoms please follow-up with [facility] whom is a specialist, information listed on front page  For persisting symptoms you may follow-up with your primary doctor

## 2023-10-07 ENCOUNTER — Other Ambulatory Visit: Payer: Self-pay | Admitting: Primary Care

## 2023-10-07 DIAGNOSIS — F419 Anxiety disorder, unspecified: Secondary | ICD-10-CM

## 2023-10-09 ENCOUNTER — Other Ambulatory Visit: Payer: Self-pay | Admitting: Primary Care

## 2023-10-09 DIAGNOSIS — I10 Essential (primary) hypertension: Secondary | ICD-10-CM

## 2023-10-21 ENCOUNTER — Encounter: Payer: Self-pay | Admitting: Internal Medicine

## 2023-10-21 ENCOUNTER — Ambulatory Visit (INDEPENDENT_AMBULATORY_CARE_PROVIDER_SITE_OTHER): Payer: PRIVATE HEALTH INSURANCE | Admitting: Internal Medicine

## 2023-10-21 VITALS — BP 130/82 | HR 83 | Temp 98.0°F | Ht 64.0 in | Wt 220.0 lb

## 2023-10-21 DIAGNOSIS — M778 Other enthesopathies, not elsewhere classified: Secondary | ICD-10-CM | POA: Diagnosis not present

## 2023-10-21 NOTE — Assessment & Plan Note (Signed)
Reassured--should be self limited Ice intermittently  Topical diclofenac gel Ibuprofen oral when bad

## 2023-10-21 NOTE — Progress Notes (Signed)
Subjective:    Patient ID: Karina Neal, female    DOB: 10/21/1970, 53 y.o.   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 Current Outpatient Medications on File Prior to Visit  Medication Sig Dispense Refill   cholecalciferol (VITAMIN D3) 25 MCG (1000 UNIT) tablet Take 1,000 Units by mouth daily.     fluticasone (FLONASE) 50 MCG/ACT nasal spray PLACE 1 SPRAY INTO BOTH NOSTRILS 2 (TWO) TIMES DAILY 48 mL 1   hydrochlorothiazide (HYDRODIURIL) 12.5 MG tablet TAKE 1 TABLET (12.5 MG TOTAL) BY MOUTH DAILY FOR BLOOD PRESSURE 90 tablet 1   hydrOXYzine (ATARAX) 10 MG tablet TAKE 1-2 TABLETS (10-20 MG TOTAL) BY MOUTH AT BEDTIME AS NEEDED FOR ANXIETY. 180 tablet 0   omeprazole (PRILOSEC) 20 MG capsule Take 1 capsule (20 mg total) by mouth every evening. For cough. 90 capsule 2   sertraline (ZOLOFT) 50 MG tablet TAKE 1 TABLET (50 MG TOTAL) BY MOUTH DAILY. FOR ANXIETY 90 tablet 1   vitamin B-12 (CYANOCOBALAMIN) 100 MCG tablet Take 100 mcg by mouth daily.     cyclobenzaprine (FLEXERIL) 5 MG tablet Take 1-2 tablets (5-10 mg total) by mouth 3 (three) times daily as needed for muscle spasms. (Patient not taking: Reported on 10/21/2023) 30 tablet 0   No current facility-administered medications on file prior to visit.    Allergies  Allergen Reactions   Shellfish Allergy Anaphylaxis    Throat swelling, dyspnea   Penicillins Hives    Has patient had a PCN reaction causing immediate rash, facial/tongue/throat swelling, SOB or lightheadedness with hypotension: Unknown Has patient had a  PCN reaction causing severe rash involving mucus membranes or skin necrosis: No Has patient had a PCN reaction that required hospitalization: No Has patient had a PCN reaction occurring within the last 10 years: No If all of the above answers are "NO", then may proceed with Cephalosporin use.     Past Medical History:  Diagnosis Date   Asthma    Blood pressure elevated    Chicken pox    Constrictive jewelry of finger 10/31/2020   Frequent headaches    Hypertension    Left sided numbness 04/23/2015    Past Surgical History:  Procedure Laterality Date   APPENDECTOMY     CESAREAN SECTION     CESAREAN SECTION     EYE SURGERY      Family History  Problem Relation Age of Onset   Hyperlipidemia Mother    Hypertension Mother    Hyperlipidemia Father    Hypertension Father    Diabetes Father    Hypertension Sister    Hypertension Brother    Breast cancer Neg Hx     Social History   Socioeconomic History   Marital status: Married    Spouse name: Not on file   Number of children: Not on file   Years of education: Not on file   Highest education level: 12th grade  Occupational History   Not on file  Tobacco Use   Smoking status: Never   Smokeless tobacco: Never  Vaping Use   Vaping status:  Never Used  Substance and Sexual Activity   Alcohol use: Yes    Comment: social   Drug use: No   Sexual activity: Not on file  Other Topics Concern   Not on file  Social History Narrative   ** Merged History Encounter **       ** Merged History Encounter **       Social Drivers of Corporate investment banker Strain: Low Risk  (09/30/2023)   Overall Financial Resource Strain (CARDIA)    Difficulty of Paying Living Expenses: Not hard at all  Food Insecurity: No Food Insecurity (09/30/2023)   Hunger Vital Sign    Worried About Running Out of Food in the Last Year: Never true    Ran Out of Food in the Last Year: Never true  Transportation Needs: No Transportation Needs  (09/30/2023)   PRAPARE - Administrator, Civil Service (Medical): No    Lack of Transportation (Non-Medical): No  Physical Activity: Sufficiently Active (09/30/2023)   Exercise Vital Sign    Days of Exercise per Week: 7 days    Minutes of Exercise per Session: 150+ min  Stress: Stress Concern Present (09/30/2023)   [employer] of Occupational Health - Occupational Stress Questionnaire    Feeling of Stress : Very much  Social Connections: Unknown (09/30/2023)   Social Connection and Isolation Panel [NHANES]    Frequency of Communication with Friends and Family: Twice a week    Frequency of Social Gatherings with Friends and Family: Once a week    Attends Religious Services: Patient declined    Database administrator or Organizations: Patient declined    Attends Engineer, structural: Not on file    Marital Status: Separated  Intimate Partner Violence: Not on file   Review of Systems No fever     Objective:   Physical Exam Constitutional:      Appearance: Normal appearance.  Musculoskeletal:     Comments: No right hand or wrist swelling Tenderness at base of thumb Pain with wrist extension and thenar movement  Neurological:     Mental Status: She is alert.            Assessment & Plan:

## 2023-12-17 DIAGNOSIS — R7303 Prediabetes: Secondary | ICD-10-CM

## 2023-12-17 DIAGNOSIS — E66812 Obesity, class 2: Secondary | ICD-10-CM

## 2023-12-17 DIAGNOSIS — G479 Sleep disorder, unspecified: Secondary | ICD-10-CM

## 2023-12-17 DIAGNOSIS — I10 Essential (primary) hypertension: Secondary | ICD-10-CM

## 2023-12-24 ENCOUNTER — Encounter: Payer: Self-pay | Admitting: Nurse Practitioner

## 2023-12-24 ENCOUNTER — Ambulatory Visit: Payer: PRIVATE HEALTH INSURANCE | Admitting: Nurse Practitioner

## 2023-12-24 VITALS — BP 132/67 | HR 70 | Temp 98.2°F | Ht 64.0 in | Wt 219.0 lb

## 2023-12-24 DIAGNOSIS — E538 Deficiency of other specified B group vitamins: Secondary | ICD-10-CM

## 2023-12-24 DIAGNOSIS — R7303 Prediabetes: Secondary | ICD-10-CM

## 2023-12-24 DIAGNOSIS — E559 Vitamin D deficiency, unspecified: Secondary | ICD-10-CM

## 2023-12-24 DIAGNOSIS — I10 Essential (primary) hypertension: Secondary | ICD-10-CM

## 2023-12-24 DIAGNOSIS — Z6837 Body mass index (BMI) 37.0-37.9, adult: Secondary | ICD-10-CM

## 2023-12-24 DIAGNOSIS — E66812 Obesity, class 2: Secondary | ICD-10-CM

## 2023-12-24 NOTE — Progress Notes (Signed)
 Office: [phone]  /  Fax: [phone]   Initial Visit  Ronna Herskowitz was seen in clinic today to evaluate for obesity. She is interested in losing weight to improve overall health and reduce the risk of weight related complications. She presents today to review program treatment options, initial physical assessment, and evaluation.     She was referred by: PCP  When asked what else they would like to accomplish? She states: Adopt healthier eating patterns, Improve energy levels and physical activity, Improve existing medical conditions, Improve quality of life, Improve appearance, and Improve self-confidence   When asked how has your weight affected you? She states: Contributed to orthopedic problems or mobility issues, Having fatigue, and Having poor endurance  Some associated conditions: HTN, migraines, GERD, anxiety, history of CVA, pre diabetes, Vit D def, Vit B12 def  Contributing factors: Family history of obesity and Menopause  Weight promoting medications identified: None  Current nutrition plan: None  Current level of physical activity: None  Current or previous pharmacotherapy: None  Response to medication: Never tried medications   Past medical history includes:   Past Medical History:  Diagnosis Date   Asthma    Blood pressure elevated    Chicken pox    Constrictive jewelry of finger 10/31/2020   Frequent headaches    Hypertension    Left sided numbness 04/23/2015     Objective:   BP 132/67   Pulse 70   Temp 98.2 F (36.8 C)   Ht 5\' 4"  (1.626 m)   Wt 219 lb (99.3 kg)   LMP  (LMP Unknown)   SpO2 98%   BMI 37.59 kg/m  She was weighed on the bioimpedance scale: Body mass index is 37.59 kg/m.  Peak Weight:227 lbs , Body Fat%:46.1%, Visceral Fat Rating:13, Weight trend over the last 12 months: Increasing  General:  Alert, oriented and cooperative. Patient is in no acute distress.  Respiratory: Normal respiratory effort, no problems with  respiration noted   Gait: able to ambulate independently  Mental Status: Normal mood and affect. Normal behavior. Normal judgment and thought content.   DIAGNOSTIC DATA REVIEWED:  BMET    Component Value Date/Time   NA 142 05/22/2023 1153   NA 140 05/21/2014 1647   K 3.8 05/22/2023 1153   K 3.5 05/21/2014 1647   CL 103 05/22/2023 1153   CL 108 (H) 05/21/2014 1647   CO2 31 05/22/2023 1153   CO2 24 05/21/2014 1647   GLUCOSE 86 05/22/2023 1153   GLUCOSE 110 (H) 05/21/2014 1647   BUN 13 05/22/2023 1153   BUN 10 05/21/2014 1647   CREATININE 0.76 05/22/2023 1153   CREATININE 0.91 05/21/2014 1647   CALCIUM 9.3 05/22/2023 1153   CALCIUM 8.4 (L) 05/21/2014 1647   GFRNONAA >60 11/15/2017 1533   GFRNONAA >60 05/21/2014 1647   GFRAA >60 11/15/2017 1533   GFRAA >60 05/21/2014 1647   Lab Results  Component Value Date   HGBA1C 6.0 05/22/2023   HGBA1C 5.4 04/24/2015   No results found for: "INSULIN" CBC    Component Value Date/Time   WBC 6.1 09/30/2023 1211   RBC 4.68 09/30/2023 1211   HGB 13.7 09/30/2023 1211   HGB 12.3 05/21/2014 1647   HCT 42.0 09/30/2023 1211   HCT 36.8 05/21/2014 1647   PLT 148.0 (L) 09/30/2023 1211   PLT 155 05/21/2014 1647   MCV 89.7 09/30/2023 1211   MCV 89 05/21/2014 1647   MCH 27.8 11/15/2017 1533   MCHC 32.7 09/30/2023 1211  RDW 13.7 09/30/2023 1211   RDW 14.2 05/21/2014 1647   Iron/TIBC/Ferritin/ %Sat    Component Value Date/Time   IRON 84 05/22/2023 1153   TIBC 383.6 05/22/2023 1153   FERRITIN 64.5 05/22/2023 1153   IRONPCTSAT 21.9 05/22/2023 1153   Lipid Panel     Component Value Date/Time   CHOL 176 05/22/2023 1153   CHOL 138 03/03/2013 0640   TRIG 95.0 05/22/2023 1153   TRIG 81 03/03/2013 0640   HDL 49.60 05/22/2023 1153   HDL 42 03/03/2013 0640   CHOLHDL 4 05/22/2023 1153   VLDL 19.0 05/22/2023 1153   VLDL 16 03/03/2013 0640   LDLCALC 108 (H) 05/22/2023 1153   LDLCALC 80 03/03/2013 0640   Hepatic Function Panel      Component Value Date/Time   PROT 7.0 05/22/2023 1153   PROT 7.0 04/06/2014 0840   ALBUMIN 4.1 05/22/2023 1153   ALBUMIN 3.4 04/06/2014 0840   AST 18 05/22/2023 1153   AST 17 04/06/2014 0840   ALT 16 05/22/2023 1153   ALT 20 04/06/2014 0840   ALKPHOS 56 05/22/2023 1153   ALKPHOS 46 04/06/2014 0840   BILITOT 0.8 05/22/2023 1153   BILITOT 0.6 04/06/2014 0840   BILIDIR 0.1 02/12/2023 0917      Component Value Date/Time   TSH 1.40 05/22/2023 1153     Assessment and Plan:   Essential hypertension Continue to follow-up with PCP.  Continue medications as directed.  Prediabetes Will obtain fasting labs at next visit.  Vitamin D deficiency Will obtain labs at next visit.  Vitamin B 12 deficiency Will obtain labs at next visit.  Class 2 obesity due to excess calories without serious comorbidity with body mass index (BMI) of 37.0 to 37.9 in adult        Obesity Treatment / Action Plan:  Patient will work on garnering support from family and friends to begin weight loss journey. Will work on eliminating or reducing the presence of highly palatable, calorie dense foods in the home. Will complete provided nutritional and psychosocial assessment questionnaire before the next appointment. Will be scheduled for indirect calorimetry to determine resting energy expenditure in a fasting state.  This will allow [place] to create a reduced calorie, high-protein meal plan to promote loss of fat mass while preserving muscle mass. Counseled on the health benefits of losing 5%-15% of total body weight. Was counseled on nutritional approaches to weight loss and benefits of reducing processed foods and consuming plant-based foods and high quality protein as part of nutritional weight management. Was counseled on pharmacotherapy and role as an adjunct in weight management.   Obesity Education Performed Today:  She was weighed on the bioimpedance scale and results were discussed and documented in  the synopsis.  We discussed obesity as a disease and the importance of a more detailed evaluation of all the factors contributing to the disease.  We discussed the importance of long term lifestyle changes which include nutrition, exercise and behavioral modifications as well as the importance of customizing this to her specific health and social needs.  We discussed the benefits of reaching a healthier weight to alleviate the symptoms of existing conditions and reduce the risks of the biomechanical, metabolic and psychological effects of obesity.  Jeanean Hollett appears to be in the action stage of change and states they are ready to start intensive lifestyle modifications and behavioral modifications.  30 minutes was spent today on this visit including the above counseling, pre-visit chart review, and post-visit documentation.  Reviewed by clinician on day of visit: allergies, medications, problem list, medical history, surgical history, family history, social history, and previous encounter notes pertinent to obesity diagnosis.    Crist Dominion Kajuana Shareef FNP-C

## 2024-01-06 DIAGNOSIS — Z0289 Encounter for other administrative examinations: Secondary | ICD-10-CM

## 2024-01-15 ENCOUNTER — Encounter: Payer: Self-pay | Admitting: Bariatrics

## 2024-01-15 ENCOUNTER — Ambulatory Visit: Payer: PRIVATE HEALTH INSURANCE | Admitting: Bariatrics

## 2024-01-15 VITALS — BP 138/88 | HR 68 | Temp 97.5°F | Ht 64.0 in | Wt 222.0 lb

## 2024-01-15 DIAGNOSIS — R7303 Prediabetes: Secondary | ICD-10-CM

## 2024-01-15 DIAGNOSIS — Z1331 Encounter for screening for depression: Secondary | ICD-10-CM | POA: Diagnosis not present

## 2024-01-15 DIAGNOSIS — R5383 Other fatigue: Secondary | ICD-10-CM

## 2024-01-15 DIAGNOSIS — E559 Vitamin D deficiency, unspecified: Secondary | ICD-10-CM

## 2024-01-15 DIAGNOSIS — R0602 Shortness of breath: Secondary | ICD-10-CM

## 2024-01-15 DIAGNOSIS — I10 Essential (primary) hypertension: Secondary | ICD-10-CM | POA: Diagnosis not present

## 2024-01-15 DIAGNOSIS — E66812 Obesity, class 2: Secondary | ICD-10-CM | POA: Diagnosis not present

## 2024-01-15 DIAGNOSIS — Z6838 Body mass index (BMI) 38.0-38.9, adult: Secondary | ICD-10-CM

## 2024-01-15 DIAGNOSIS — E669 Obesity, unspecified: Secondary | ICD-10-CM

## 2024-01-15 DIAGNOSIS — R0683 Snoring: Secondary | ICD-10-CM

## 2024-01-15 DIAGNOSIS — Z0000 Encounter for general adult medical examination without abnormal findings: Secondary | ICD-10-CM

## 2024-01-15 NOTE — Progress Notes (Signed)
 At a Glance:  Vitals Temp: (!) 97.5 F (36.4 C) BP: 138/88 Pulse Rate: 68 SpO2: 95 %   Anthropometric Measurements Height: 5\' 4"  (1.626 m) Weight: 222 lb (100.7 kg) BMI (Calculated): 38.09 Starting Weight: 222lb Peak Weight: 227lb   Body Composition  Body Fat %: 46.3 % Fat Mass (lbs): 102.8 lbs Muscle Mass (lbs): 113.2 lbs Total Body Water (lbs): 78.6 lbs Visceral Fat Rating : 13   Other Clinical Data RMR: 1771 Fasting: yes Labs: yes Today's Visit #: 1 Starting Date: 01/15/24    EKG: Normal sinus rhythm, rate 63.  Indirect Calorimeter:   Resting Metabolic Rate ( RMR):  RMR (actual): 1771 kcal RMR (calculated): 1678 kcal The calculated basal metabolic rate is 2542 kcal  thus her basal metabolic rate is better than expected.  Plan:   Indirect calorimeter completed, interpreted and reviewed with patient today and allowed to ask questions.  Discussed the implications for the chosen plan and exercise based on the RMR reading.  Will consider repeating the RMR in the future based on weight loss.    Chief Complaint:  Obesity   Subjective:  Karina Neal (MR# [id number]) is a 53 y.o. female who presents for evaluation and treatment of obesity and related comorbidities.   Karina Neal is currently in the action stage of change and ready to dedicate time achieving and maintaining a healthier weight. Karina Neal is interested in becoming our patient and working on intensive lifestyle modifications including (but not limited to) diet and exercise for weight loss.  Karina Neal has been struggling with her weight. She has been unsuccessful in either losing weight, maintaining weight loss, or reaching her healthy weight goal.  Karina Neal's habits were reviewed today and are as follows: she thinks her family will eat healthier with her, she started gaining weight with her last child , she is a picky eater and doesn't like to eat healthier foods, she has significant food cravings issues,  she snacks frequently in the evenings, she skips meals frequently, and she frequently makes poor food choices.  Current or previous pharmacotherapy: None and Is interested in pharmacotherapy  Response to medication: Never tried medications  Other Fatigue Sandra admits to daytime somnolence and admits to waking up still tired. Patient has a history of symptoms of daytime fatigue. Karina Neal generally gets 3 or 4 hours of sleep per night, and states that she has difficulty falling back asleep if awakened. Snoring is present. Apneic episodes is not present. Epworth Sleepiness Score is 16.   Shortness of Breath Karina Neal notes increasing shortness of breath with exercising and seems to be worsening over time with weight gain. She notes getting out of breath sooner with activity than she used to. This has gotten worse recently. Karina Neal denies shortness of breath at rest or orthopnea.  Depression Screen Karina Neal's Food and Mood (modified PHQ-9) score was 18. 15-19 moderate severe depression     05/22/2023   11:28 AM  Depression screen PHQ 2/9  Decreased Interest 2  Down, Depressed, Hopeless 2  PHQ - 2 Score 4  Altered sleeping 3  Tired, decreased energy 3  Change in appetite 2  Feeling bad or failure about yourself  0  Trouble concentrating 1  Moving slowly or fidgety/restless 0  Suicidal thoughts 0  PHQ-9 Score 13  Difficult doing work/chores Somewhat difficult     Assessment and Plan:   Other Fatigue Karina Neal does feel that her weight is causing her energy to be lower than it should be. Fatigue may be  related to obesity, depression or many other causes. Labs will be ordered, and in the meanwhile, Karina Neal will focus on self care including making healthy food choices, increasing physical activity and focusing on stress reduction.  Shortness of Breath Karina Neal does feel that she gets out of breath more easily that she used to when she exercises. Karina Neal's shortness of breath appears to be obesity  related and exercise induced. She has agreed to work on weight loss and gradually increase exercise to treat her exercise induced shortness of breath. Will continue to monitor closely.  Health Maintenance:   Obesity   Plan: Will do EKG, indirect calorimetry, and labs.     Vitamin D  Deficiency She is at risk for vitamin D  deficiency due to obesity.  She is on vitamin D  and B 12.  Lab Results  Component Value Date   VD25OH 34.24 06/05/2022   VD25OH 32.37 02/21/2022   VD25OH 7.64 (L) 01/17/2022    Plan: Will check for vitamin D  deficiency.   Karina Neal had a positive depression screening. Depression is commonly associated with obesity and often results in emotional eating behaviors. We will monitor this closely and work on CBT to help improve the non-hunger eating patterns. Referral to Psychology may be required if no improvement is seen as she continues in our clinic.   Hypertension Hypertension stable.  Medication(s): Hydrochlorothiazide  12.5 mg daily  BP Readings from Last 3 Encounters:  01/15/24 138/88  12/24/23 132/67  10/21/23 130/82   Lab Results  Component Value Date   CREATININE 0.76 05/22/2023   CREATININE 0.76 02/12/2023   CREATININE 0.84 01/17/2022   Lab Results  Component Value Date   GFR 90.74 05/22/2023   GFR 90.91 02/12/2023   GFR 81.23 01/17/2022    Plan: Continue all antihypertensives at current dosages. No added salt. Will keep sodium content to 1,500 mg or less per day.    Prediabetes Last A1c was 6.0  Medication(s): none Lab Results  Component Value Date   HGBA1C 6.0 05/22/2023   HGBA1C 5.9 08/27/2022   HGBA1C 6.2 06/05/2022   HGBA1C 6.0 01/17/2022   HGBA1C 5.8 03/02/2021    Plan: Will minimize all refined carbohydrates both sweets and starches.  Will work on the plan and exercise.  Consider both aerobic and resistance training.  Will keep protein, water, and fiber intake high.  Increase Polyunsaturated and Monounsaturated fats to  increase satiety and encourage weight loss.  Aim for 7 to 9 hours of sleep nightly.   Habitual snoring:   She snores on a regular basis and is having nonrestorative sleep.  Plan: Schedule a sleep study   Previous labs reviewed today. Date: 05/22/2023 CMP, Lipids, Vit D, Vit B12, and CBC sed rate, iron and anemia  Labs done today CMP, Lipids, Insulin, HgbA1c, Vit D, and Thyroid  Panel   Generalized Obesity: BMI (Calculated): 38.09   Karina Neal is currently in the action stage of change and her goal is to begin weight loss efforts. I recommend Karina Neal begin the structured treatment plan as follows:  She has agreed to Category 2 Plan + 100 calories   Exercise goals: All adults should avoid inactivity. Some activity is better than none, and adults who participate in any amount of physical activity, gain some health benefits.  Behavioral modification strategies:increasing lean protein intake, decreasing simple carbohydrates, increasing vegetables, increase H2O intake, no skipping meals, meal planning and cooking strategies, keeping healthy foods in the home, and planning for success  She was informed of the importance of  frequent follow-up visits to maximize her success with intensive lifestyle modifications for her multiple health conditions. She was informed we would discuss her lab results at her next visit unless there is a critical issue that needs to be addressed sooner. Karina Neal agreed to keep her next visit at the agreed upon time to discuss these results.  Objective:  General: Cooperative, alert, well developed, in no acute distress. HEENT: Conjunctivae and lids unremarkable. Cardiovascular: Regular rhythm.  Lungs: Normal work of breathing. Neurologic: No focal deficits.   Lab Results  Component Value Date   CREATININE 0.76 05/22/2023   BUN 13 05/22/2023   NA 142 05/22/2023   K 3.8 05/22/2023   CL 103 05/22/2023   CO2 31 05/22/2023   Lab Results  Component Value Date   ALT  16 05/22/2023   AST 18 05/22/2023   ALKPHOS 56 05/22/2023   BILITOT 0.8 05/22/2023   Lab Results  Component Value Date   HGBA1C 6.0 05/22/2023   HGBA1C 5.9 08/27/2022   HGBA1C 6.2 06/05/2022   HGBA1C 6.0 01/17/2022   HGBA1C 5.8 03/02/2021   No results found for: "INSULIN" Lab Results  Component Value Date   TSH 1.40 05/22/2023   Lab Results  Component Value Date   CHOL 176 05/22/2023   HDL 49.60 05/22/2023   LDLCALC 108 (H) 05/22/2023   TRIG 95.0 05/22/2023   CHOLHDL 4 05/22/2023   Lab Results  Component Value Date   WBC 6.1 09/30/2023   HGB 13.7 09/30/2023   HCT 42.0 09/30/2023   MCV 89.7 09/30/2023   PLT 148.0 (L) 09/30/2023   Lab Results  Component Value Date   IRON 84 05/22/2023   TIBC 383.6 05/22/2023   FERRITIN 64.5 05/22/2023    Attestation Statements:  Applicable history such as the following:  allergies, medications, problem list, medical history, surgical history, family history, social history, and previous encounter notes reviewed by clinician on day of visit:  Time spent on visit in care of the patient today including the items listed below was 40 minutes.    20 minutes were spent talking about the history, 15 minutes for face to face counseling implementing the plan, discussing the specifics of how to arrange meals, meal planning, water intake.   I spent face to face time discussing his/her plan, including breakfast, additional breakfast options, lunch, and dinner options, grocery list, and snacks.  I reviewed her indirect calorimetry. I discussed the implications for the diet plan.    Discussed the bio-impedence test (fat %, muscle mass, and water weight) and allowed the patient to ask questions.   Discussed the following information sheets: Category 2, Grocery List, 100 Calorie Snacks, 200 Calorie Snacks, and protein shake sheet, and smart fruit sheet.   I reviewed the labs which were ordered from her visit on 05/22/2023.   I additionally  spent time documenting, reviewing, and checking the codes before submitting.   This may have been prepared with the assistance of Engineer, civil (consulting).  Occasional wrong-word or sound-a-like substitutions may have occurred due to the inherent limitations of voice recognition software.    Kirk Peper, DO

## 2024-01-17 LAB — COMPREHENSIVE METABOLIC PANEL WITH GFR
ALT: 19 IU/L (ref 0–32)
AST: 19 IU/L (ref 0–40)
Albumin: 4.6 g/dL (ref 3.8–4.9)
Alkaline Phosphatase: 69 IU/L (ref 44–121)
BUN/Creatinine Ratio: 16 (ref 9–23)
BUN: 12 mg/dL (ref 6–24)
Bilirubin Total: 0.5 mg/dL (ref 0.0–1.2)
CO2: 22 mmol/L (ref 20–29)
Calcium: 9.5 mg/dL (ref 8.7–10.2)
Chloride: 105 mmol/L (ref 96–106)
Creatinine, Ser: 0.73 mg/dL (ref 0.57–1.00)
Globulin, Total: 2.6 g/dL (ref 1.5–4.5)
Glucose: 84 mg/dL (ref 70–99)
Potassium: 4.6 mmol/L (ref 3.5–5.2)
Sodium: 141 mmol/L (ref 134–144)
Total Protein: 7.2 g/dL (ref 6.0–8.5)
eGFR: 99 mL/min/{1.73_m2} (ref >59)

## 2024-01-17 LAB — HEMOGLOBIN A1C
Est. average glucose Bld gHb Est-mCnc: 126 mg/dL
Hgb A1c MFr Bld: 6 % — ABNORMAL HIGH (ref 4.8–5.6)

## 2024-01-17 LAB — LIPID PANEL WITH LDL/HDL RATIO
Cholesterol, Total: 180 mg/dL (ref 100–199)
HDL: 50 mg/dL (ref >39)
LDL Chol Calc (NIH): 116 mg/dL — ABNORMAL HIGH (ref 0–99)
LDL/HDL Ratio: 2.3 ratio (ref 0.0–3.2)
Triglycerides: 76 mg/dL (ref 0–149)
VLDL Cholesterol Cal: 14 mg/dL (ref 5–40)

## 2024-01-17 LAB — TSH+T4F+T3FREE
Free T4: 1.09 ng/dL (ref 0.82–1.77)
T3, Free: 3.1 pg/mL (ref 2.0–4.4)
TSH: 1.28 u[IU]/mL (ref 0.450–4.500)

## 2024-01-17 LAB — VITAMIN D 25 HYDROXY (VIT D DEFICIENCY, FRACTURES): Vit D, 25-Hydroxy: 20.3 ng/mL — ABNORMAL LOW (ref 30.0–100.0)

## 2024-01-17 LAB — INSULIN, RANDOM: INSULIN: 14.5 u[IU]/mL (ref 2.6–24.9)

## 2024-01-29 ENCOUNTER — Ambulatory Visit (INDEPENDENT_AMBULATORY_CARE_PROVIDER_SITE_OTHER): Payer: PRIVATE HEALTH INSURANCE | Admitting: Bariatrics

## 2024-02-24 ENCOUNTER — Ambulatory Visit: Payer: PRIVATE HEALTH INSURANCE | Admitting: Bariatrics

## 2024-02-24 ENCOUNTER — Other Ambulatory Visit: Payer: Self-pay | Admitting: Bariatrics

## 2024-02-24 ENCOUNTER — Encounter: Payer: Self-pay | Admitting: Bariatrics

## 2024-02-24 VITALS — BP 133/82 | HR 78 | Temp 98.3°F | Ht 64.0 in | Wt 226.0 lb

## 2024-02-24 DIAGNOSIS — E669 Obesity, unspecified: Secondary | ICD-10-CM | POA: Diagnosis not present

## 2024-02-24 DIAGNOSIS — E559 Vitamin D deficiency, unspecified: Secondary | ICD-10-CM

## 2024-02-24 DIAGNOSIS — R7303 Prediabetes: Secondary | ICD-10-CM

## 2024-02-24 DIAGNOSIS — Z6838 Body mass index (BMI) 38.0-38.9, adult: Secondary | ICD-10-CM | POA: Diagnosis not present

## 2024-02-24 DIAGNOSIS — E66812 Obesity, class 2: Secondary | ICD-10-CM

## 2024-02-24 MED ORDER — VITAMIN D (ERGOCALCIFEROL) 1.25 MG (50000 UNIT) PO CAPS
50000.0000 [IU] | ORAL_CAPSULE | ORAL | 0 refills | Status: DC
Start: 2024-02-24 — End: 2024-04-13

## 2024-02-24 MED ORDER — WEGOVY 0.25 MG/0.5ML ~~LOC~~ SOAJ: 0.2500 mg | SUBCUTANEOUS | 0 refills | Status: DC

## 2024-02-24 NOTE — Progress Notes (Signed)
 First follow-up after initial visit.        WEIGHT SUMMARY AND BIOMETRICS  Weight Lost Since Last Visit: 0  Weight Gained Since Last Visit: 4lb   Vitals Temp: 98.3 F (36.8 C) BP: 133/82 Pulse Rate: 78 SpO2: 97 %   Anthropometric Measurements Height: 5' 4 (1.626 m) Weight: 226 lb (102.5 kg) BMI (Calculated): 38.77 Weight at Last Visit: 222lb Weight Lost Since Last Visit: 0 Weight Gained Since Last Visit: 4lb Starting Weight: 222lb Total Weight Loss (lbs): 0 lb (0 kg) Peak Weight: 227lb   Body Composition  Body Fat %: 46.5 % Fat Mass (lbs): 105.4 lbs Muscle Mass (lbs): 115.2 lbs Total Body Water (lbs): 80 lbs Visceral Fat Rating : 14   Other Clinical Data Fasting: no Labs: no Today's Visit #: 2 Starting Date: 01/15/24    OBESITY Karina Neal is here to discuss her progress with her obesity treatment plan along with follow-up of her obesity related diagnoses.    Nutrition Plan: the Category 2 plan + 100 calories - 25% adherence.  Current exercise: walking  Interim History:  She is up 4 lbs since at work.  Eating all of the food on the plan., Protein intake is as prescribed, Is not drinking sugar sweetened beverages., Is skipping meals, Water intake is adequate., and Reports polyphagia  Initial positives regarding the dietary plan: The plan is not that hard.  Initial challenges regarding  the dietary plan: She has been hungry at work.   Pharmacotherapy: She has not been on  Hunger is poorly controlled.  Cravings are poorly controlled.  Assessment/Plan:   Vitamin D  Deficiency Vitamin D  is not at goal of 50.  Most recent vitamin D  level was 20.3. She is on no vitamin supplements.  Lab Results  Component Value Date   VD25OH 20.3 (L) 01/15/2024   VD25OH 34.24 06/05/2022   VD25OH 32.37 02/21/2022    Plan: Begin prescription vitamin D   50,000 IU weekly.   Prediabetes Last A1c was 6.0  Medication(s): none.  She denies a history of taking any antiobesity medications in the past.  She denies any absolute or relative contraindications to GLP-1's. Lab Results  Component Value Date   HGBA1C 6.0 (H) 01/15/2024   HGBA1C 6.0 05/22/2023   HGBA1C 5.9 08/27/2022   HGBA1C 6.2 06/05/2022   HGBA1C 6.0 01/17/2022   Lab Results  Component Value Date   INSULIN  14.5 01/15/2024    Plan: Will minimize all refined carbohydrates both sweets and starches.  Will work on the plan and exercise.  Consider both aerobic and resistance training.  Will keep protein, water, and fiber intake high.  Increase Polyunsaturated and Monounsaturated fats to increase satiety and encourage weight loss.  Aim for 7 to 9 hours of sleep nightly.  Start Wegovy 0.25 mg SQ weekly     Generalized Obesity: Current BMI BMI (Calculated): 38.77   Pharmacotherapy Plan Start  Wegovy 0.25 mg SQ weekly  Karina Neal is currently in the action stage of change. As such, her goal is to continue with weight loss efforts.  She has agreed to the Category 2 plan and keeping a food journal with goal of 1,300 calories and 90 grams of protein daily.  Exercise goals: All adults should avoid inactivity. Some physical activity is better than none, and adults who participate in any amount of physical activity gain some health benefits.  Behavioral modification strategies: increasing lean protein intake, no meal skipping, meal planning , increase water intake, better snacking choices, planning for success,  increasing vegetables, decrease junk food, decrease snacking , measure portion sizes, and mindful eating.  Karina Neal has agreed to follow-up with our clinic in 2 weeks.   Labs reviewed today from last visit (CMP, Lipids, HgbA1c, insulin , vitamin D , and thyroid  panel).   Objective:   VITALS: Per patient if applicable, see vitals. GENERAL: Alert and in no acute  distress. CARDIOPULMONARY: No increased WOB. Speaking in clear sentences.  PSYCH: Pleasant and cooperative. Speech normal rate and rhythm. Affect is appropriate. Insight and judgement are appropriate. Attention is focused, linear, and appropriate.  NEURO: Oriented as arrived to appointment on time with no prompting.   Attestation Statements:   This was prepared with the assistance of Engineer, civil (consulting).  Occasional wrong-word or sound-a-like substitutions may have occurred due to the inherent limitations of voice recognition software.   Kirk Peper, DO

## 2024-03-09 ENCOUNTER — Ambulatory Visit: Payer: PRIVATE HEALTH INSURANCE | Admitting: Bariatrics

## 2024-03-09 ENCOUNTER — Encounter: Payer: Self-pay | Admitting: Bariatrics

## 2024-03-09 VITALS — BP 132/74 | HR 75 | Temp 97.9°F | Ht 64.0 in | Wt 224.0 lb

## 2024-03-09 DIAGNOSIS — R7303 Prediabetes: Secondary | ICD-10-CM

## 2024-03-09 DIAGNOSIS — I10 Essential (primary) hypertension: Secondary | ICD-10-CM

## 2024-03-09 DIAGNOSIS — Z6838 Body mass index (BMI) 38.0-38.9, adult: Secondary | ICD-10-CM | POA: Diagnosis not present

## 2024-03-09 DIAGNOSIS — E669 Obesity, unspecified: Secondary | ICD-10-CM

## 2024-03-09 DIAGNOSIS — E66812 Obesity, class 2: Secondary | ICD-10-CM

## 2024-03-09 MED ORDER — METFORMIN HCL 500 MG PO TABS: 500.0000 mg | ORAL_TABLET | Freq: Every day | ORAL | 0 refills | Status: DC

## 2024-03-09 NOTE — Progress Notes (Signed)
 WEIGHT SUMMARY AND BIOMETRICS  Weight Lost Since Last Visit: 2lb  Weight Gained Since Last Visit: 0   Vitals Temp: 97.9 F (36.6 C) BP: 132/74 Pulse Rate: 75 SpO2: 98 %   Anthropometric Measurements Height: 5' 4 (1.626 m) Weight: 224 lb (101.6 kg) BMI (Calculated): 38.43 Weight at Last Visit: 226lb Weight Lost Since Last Visit: 2lb Weight Gained Since Last Visit: 0 Starting Weight: 222lb Total Weight Loss (lbs): 0 lb (0 kg) Peak Weight: 227lb   Body Composition  Body Fat %: 46.3 % Fat Mass (lbs): 103.8 lbs Muscle Mass (lbs): 114.2 lbs Total Body Water (lbs): 79.2 lbs Visceral Fat Rating : 13   Other Clinical Data Fasting: no Labs: no Today's Visit #: 3 Starting Date: 01/15/24    OBESITY Karina Neal is here to discuss her progress with her obesity treatment plan along with follow-up of her obesity related diagnoses.    Nutrition Plan: the Category 2 plan + 100 - 95% adherence.  Current exercise: Infinity hoop  Interim History:  She is down 2 lbs since her last visit.  Eating all of the food on the plan., Protein intake is as prescribed, Is not skipping meals, and Water intake is adequate.   Hunger is moderately controlled.  Cravings are moderately controlled.  Assessment/Plan:   Prediabetes Last A1c was 6.0  Medication(s): Was prescribed Wegovy  0.25 mg SQ weekly, but insurance would not cover. Lab Results  Component Value Date   HGBA1C 6.0 (H) 01/15/2024   HGBA1C 6.0 05/22/2023   HGBA1C 5.9 08/27/2022   HGBA1C 6.2 06/05/2022   HGBA1C 6.0 01/17/2022   Lab Results  Component Value Date   INSULIN  14.5 01/15/2024    Plan: Will minimize all refined carbohydrates both sweets and starches.  Will work on the plan and exercise.  Consider both aerobic and resistance training.  Will keep protein, water, and fiber intake high.  Increase  Polyunsaturated and Monounsaturated fats to increase satiety and encourage weight loss.  Aim for 7 to 9 hours of sleep nightly.  Start Metformin 500 mg once daily breakfast   Hypertension Hypertension stable.  Medication(s): Hydrochlorothiazide  12.5 mg daily  BP Readings from Last 3 Encounters:  03/09/24 132/74  02/24/24 133/82  01/15/24 138/88   Lab Results  Component Value Date   CREATININE 0.73 01/15/2024   CREATININE 0.76 05/22/2023   CREATININE 0.76 02/12/2023   Lab Results  Component Value Date   GFR 90.74 05/22/2023   GFR 90.91 02/12/2023   GFR 81.23 01/17/2022    Plan: Continue all antihypertensives at current dosages. No added salt. Will keep sodium content to 1,500 mg or less per day.      Generalized Obesity: Current BMI BMI (Calculated): 38.43   Pharmacotherapy Plan Start  Metformin 500 mg once daily breakfast  Karina Neal is currently in the action stage of change. As such, her goal is  to continue with weight loss efforts.  She has agreed to the Category 2 plan + 100 calories.   Exercise goals: Older adults should determine their level of effort for physical activity relative to their level of fitness.   Behavioral modification strategies: increasing lean protein intake, decreasing simple carbohydrates , no meal skipping, meal planning , decrease liquid calories, increase water intake, better snacking choices, planning for success, increasing vegetables, avoiding temptations, keep healthy foods in the home, weigh protein portions, and work on smaller portions.  Karina Neal has agreed to follow-up with our clinic in 3 weeks.     Objective:   VITALS: Per patient if applicable, see vitals. GENERAL: Alert and in no acute distress. CARDIOPULMONARY: No increased WOB. Speaking in clear sentences.  PSYCH: Pleasant and cooperative. Speech normal rate and rhythm. Affect is appropriate. Insight and judgement are appropriate. Attention is focused, linear, and appropriate.   NEURO: Oriented as arrived to appointment on time with no prompting.   Attestation Statements:   This was prepared with the assistance of Engineer, civil (consulting).  Occasional wrong-word or sound-a-like substitutions may have occurred due to the inherent limitations of voice recognition   Clayborne Daring, DO

## 2024-03-11 ENCOUNTER — Ambulatory Visit: Payer: Self-pay | Admitting: Podiatry

## 2024-03-30 ENCOUNTER — Telehealth: Payer: Self-pay | Admitting: Bariatrics

## 2024-03-30 ENCOUNTER — Other Ambulatory Visit: Payer: Self-pay | Admitting: Bariatrics

## 2024-03-30 ENCOUNTER — Ambulatory Visit: Payer: PRIVATE HEALTH INSURANCE | Admitting: Bariatrics

## 2024-03-30 ENCOUNTER — Encounter: Payer: Self-pay | Admitting: Bariatrics

## 2024-03-30 ENCOUNTER — Other Ambulatory Visit (HOSPITAL_COMMUNITY): Payer: Self-pay

## 2024-03-30 VITALS — BP 132/85 | HR 71 | Temp 97.8°F | Ht 64.0 in | Wt 219.0 lb

## 2024-03-30 DIAGNOSIS — Z6837 Body mass index (BMI) 37.0-37.9, adult: Secondary | ICD-10-CM | POA: Diagnosis not present

## 2024-03-30 DIAGNOSIS — R632 Polyphagia: Secondary | ICD-10-CM | POA: Diagnosis not present

## 2024-03-30 DIAGNOSIS — E669 Obesity, unspecified: Secondary | ICD-10-CM | POA: Diagnosis not present

## 2024-03-30 DIAGNOSIS — I10 Essential (primary) hypertension: Secondary | ICD-10-CM | POA: Diagnosis not present

## 2024-03-30 MED ORDER — PHENTERMINE-TOPIRAMATE ER 7.5-46 MG PO CP24
1.0000 | ORAL_CAPSULE | Freq: Every morning | ORAL | 0 refills | Status: DC
  Filled 2024-03-30 (×2): qty 30, 30d supply, fill #0

## 2024-03-30 MED ORDER — PHENTERMINE-TOPIRAMATE ER 7.5-46 MG PO CP24: 1.0000 | ORAL_CAPSULE | Freq: Every morning | ORAL | 0 refills | Status: DC

## 2024-03-30 NOTE — Telephone Encounter (Signed)
 Patient stated the Qsymia  is $300 at the pharmacy. She stated she was told a different price from Dr.Brown Please advise

## 2024-03-30 NOTE — Telephone Encounter (Signed)
 Left message for patient to return call.

## 2024-03-30 NOTE — Progress Notes (Signed)
 WEIGHT SUMMARY AND BIOMETRICS  Weight Lost Since Last Visit: 5lb  Weight Gained Since Last Visit: 0   Vitals Temp: 97.8 F (36.6 C) BP: 132/85 Pulse Rate: 71 SpO2: 97 %   Anthropometric Measurements Height: 5' 4 (1.626 m) Weight: 219 lb (99.3 kg) BMI (Calculated): 37.57 Weight at Last Visit: 224lb Weight Lost Since Last Visit: 5lb Weight Gained Since Last Visit: 0 Starting Weight: 222lb Total Weight Loss (lbs): 3 lb (1.361 kg) Peak Weight: 227lb   Body Composition  Body Fat %: 43.8 % Fat Mass (lbs): 96.2 lbs Muscle Mass (lbs): 117.2 lbs Total Body Water (lbs): 76 lbs Visceral Fat Rating : 12   Other Clinical Data Fasting: yes Labs: no Today's Visit #: 4 Starting Date: 01/15/24    OBESITY Kyren is here to discuss her progress with her obesity treatment plan along with follow-up of her obesity related diagnoses.    Nutrition Plan: the Category 2 plan + 100- 50% adherence.  Current exercise: walking  Interim History:  She is down 5 lbs since her last visit.  She states that her appetite is reasonable at this time she has been trying to increase her protein and decrease her portion size.  She also is using a hula hoop for exercise and some walking.  She does have some polyphasia at times.  She denies any contraindications to a stimulant.  She denies taking any weight loss medications in the past except for metformin  which she is doing well without side effects. Eating all of the food on the plan., Protein intake is as prescribed, Water intake is adequate., and Denies polyphagia  Hunger is moderately controlled.  Cravings are moderately controlled.  Assessment/Plan:   Polyphagia Tayja endorses excessive hunger.  Medication(s): none, insurance would not pay for a GLP-1.  Effects of medication (appetite):  moderately controlled. Cravings are  moderately controlled.   Plan: Medication(s): Qsymia  7.5/46 mg 1 capsule by mouth daily in am (generic). Sent a coupon for [employer].    Will increase water, protein and fiber to help assuage hunger.  Will minimize foods that have a high glucose index/load to minimize reactive hypoglycemia.   Will start Qsymia  today. Checked the PDMP, and controlled medication sheet for Phentermine /Qsymia  reviewed and signed with the patient. She  denies contraindications. Benefits and risks were discussed.    Qsymia  7.5/46 mg 1 daily # 30 with no refills.  Hypertension Hypertension well controlled.  Medication(s): Hydrochlorothiazide  12.5 mg daily  BP Readings from Last 3 Encounters:  03/30/24 132/85  03/09/24 132/74  02/24/24 133/82   Lab Results  Component Value Date   CREATININE 0.73 01/15/2024   CREATININE 0.76 05/22/2023   CREATININE 0.76 02/12/2023   Lab Results  Component Value Date   GFR 90.74 05/22/2023   GFR 90.91 02/12/2023   GFR 81.23 01/17/2022    Plan: Continue all antihypertensives at  current dosages. No added salt. Will keep sodium content to 1,500 mg or less per day.     Generalized Obesity: Current BMI BMI (Calculated): 37.57   Pharmacotherapy Plan Continue  Qsymia  7.5/46 mg 1 capsule by mouth daily in am  Ida is currently in the action stage of change. As such, her goal is to continue with weight loss efforts.  She has agreed to the Category 2 plan + 100.   Exercise goals: All adults should avoid inactivity. Some physical activity is better than none, and adults who participate in any amount of physical activity gain some health benefits.  Behavioral modification strategies: increasing lean protein intake, no meal skipping, meal planning , increase water intake, planning for success, increasing vegetables, avoiding temptations, keep healthy foods in the home, weigh protein portions, measure portion sizes, and mindful eating.  Lariah has agreed to follow-up with  our clinic in 2 weeks.   No orders of the defined types were placed in this encounter.   Medications Discontinued During This Encounter  Medication Reason   Semaglutide -Weight Management (WEGOVY ) 0.25 MG/0.5ML SOAJ Patient Preference     No orders of the defined types were placed in this encounter.     Objective:   VITALS: Per patient if applicable, see vitals. GENERAL: Alert and in no acute distress. CARDIOPULMONARY: No increased WOB. Speaking in clear sentences.  PSYCH: Pleasant and cooperative. Speech normal rate and rhythm. Affect is appropriate. Insight and judgement are appropriate. Attention is focused, linear, and appropriate.  NEURO: Oriented as arrived to appointment on time with no prompting.   Attestation Statements:   This was prepared with the assistance of Engineer, civil (consulting).  Occasional wrong-word or sound-a-like substitutions may have occurred due to the inherent limitations of voice recognition   Clayborne Daring, DO

## 2024-03-31 ENCOUNTER — Other Ambulatory Visit: Payer: Self-pay | Admitting: Bariatrics

## 2024-04-06 ENCOUNTER — Other Ambulatory Visit: Payer: Self-pay | Admitting: Bariatrics

## 2024-04-06 MED ORDER — METFORMIN HCL 500 MG PO TABS: 500.0000 mg | ORAL_TABLET | Freq: Every day | ORAL | 0 refills | Status: DC

## 2024-04-13 ENCOUNTER — Ambulatory Visit: Payer: PRIVATE HEALTH INSURANCE | Admitting: Bariatrics

## 2024-04-13 ENCOUNTER — Encounter: Payer: Self-pay | Admitting: Bariatrics

## 2024-04-13 VITALS — BP 130/81 | HR 75 | Temp 97.5°F | Ht 64.0 in | Wt 215.0 lb

## 2024-04-13 DIAGNOSIS — E559 Vitamin D deficiency, unspecified: Secondary | ICD-10-CM | POA: Diagnosis not present

## 2024-04-13 DIAGNOSIS — E669 Obesity, unspecified: Secondary | ICD-10-CM

## 2024-04-13 DIAGNOSIS — R7303 Prediabetes: Secondary | ICD-10-CM

## 2024-04-13 DIAGNOSIS — Z6836 Body mass index (BMI) 36.0-36.9, adult: Secondary | ICD-10-CM

## 2024-04-13 DIAGNOSIS — R632 Polyphagia: Secondary | ICD-10-CM | POA: Diagnosis not present

## 2024-04-13 MED ORDER — PHENTERMINE-TOPIRAMATE ER 7.5-46 MG PO CP24: 1.0000 | ORAL_CAPSULE | Freq: Every morning | ORAL | 0 refills | Status: DC

## 2024-04-13 MED ORDER — VITAMIN D (ERGOCALCIFEROL) 1.25 MG (50000 UNIT) PO CAPS: 50000.0000 [IU] | ORAL_CAPSULE | ORAL | 0 refills | Status: DC

## 2024-04-13 NOTE — Progress Notes (Signed)
 WEIGHT SUMMARY AND BIOMETRICS  Weight Lost Since Last Visit: 4lb  Weight Gained Since Last Visit: 0   Vitals Temp: (!) 97.5 F (36.4 C) BP: 130/81 Pulse Rate: 75 SpO2: 98 %   Anthropometric Measurements Height: 5' 4 (1.626 m) Weight: 215 lb (97.5 kg) BMI (Calculated): 36.89 Weight at Last Visit: 219lb Weight Lost Since Last Visit: 4lb Weight Gained Since Last Visit: 0 Starting Weight: 222lb Total Weight Loss (lbs): 7 lb (3.175 kg) Peak Weight: 227lb   Body Composition  Body Fat %: 45 % Fat Mass (lbs): 96.8 lbs Muscle Mass (lbs): 112.4 lbs Total Body Water (lbs): 73.8 lbs Visceral Fat Rating : 12   Other Clinical Data Fasting: no Labs: no Today's Visit #: 5 Starting Date: 01/15/24    OBESITY Karina Neal is here to discuss her progress with her obesity treatment plan along with follow-up of her obesity related diagnoses.    Nutrition Plan: the Category 2 plan + 100 - 90% adherence.  Current exercise: walking  Interim History:  She is down another 4 lbs.  Eating all of the food on the plan., Protein intake is as prescribed, Is not skipping meals, Water intake is adequate., and Denies polyphagia   Pharmacotherapy: Karina Neal is on Qsymia  7.5/46 mg 1 capsule by mouth daily in am Adverse side effects: None Hunger is moderately controlled.  Cravings are moderately controlled.  Assessment/Plan:   Vitamin D  Deficiency Vitamin D  is not at goal of 50.  Most recent vitamin D  level was 20.3. She is on  prescription ergocalciferol  50,000 IU weekly. Lab Results  Component Value Date   VD25OH 20.3 (L) 01/15/2024   VD25OH 34.24 06/05/2022   VD25OH 32.37 02/21/2022    Plan: Refill prescription vitamin D  50,000 IU weekly.   Polyphagia Karina Neal endorses excessive hunger.  Medication(s): Qsymia  Effects of medication (appetite):  moderately controlled.  Cravings are moderately controlled.   Plan: Medication(s): Qsymia  7.5/46 mg 1 capsule by mouth daily in am Will increase water, protein and fiber to help assuage hunger.  Will minimize foods that have a high glucose index/load to minimize reactive hypoglycemia.  She will continue to eat a salad at lunch with meat or will pack her lunch on a regular basis. She will continue to do meal planning.      Generalized Obesity: Current BMI BMI (Calculated): 36.89   Pharmacotherapy Plan Continue and refill  Qsymia  7.5/46 mg 1 capsule by mouth daily in am  Karina Neal is currently in the action stage of change. As such, her goal is to continue with weight loss efforts.  She has agreed to the Category 2 plan + 100 calories.   Exercise goals: All adults should avoid inactivity. Some physical activity is better than none, and adults who participate in any amount of physical activity gain some health benefits. She has been walking and has signed up for the  gym.   Behavioral modification strategies: increasing lean protein intake, decreasing simple carbohydrates , meal planning , increase water intake, planning for success, increasing fiber rich foods, and decrease snacking .  Karina Neal has agreed to follow-up with our clinic in 2 weeks.    Objective:   VITALS: Per patient if applicable, see vitals. GENERAL: Alert and in no acute distress. CARDIOPULMONARY: No increased WOB. Speaking in clear sentences.  PSYCH: Pleasant and cooperative. Speech normal rate and rhythm. Affect is appropriate. Insight and judgement are appropriate. Attention is focused, linear, and appropriate.  NEURO: Oriented as arrived to appointment on time with no prompting.   Attestation Statements:   This was prepared with the assistance of Engineer, civil (consulting).  Occasional wrong-word or sound-a-like substitutions may have occurred due to the inherent limitations of voice recognition   Karina Daring, DO

## 2024-05-05 ENCOUNTER — Encounter: Payer: Self-pay | Admitting: Bariatrics

## 2024-05-05 ENCOUNTER — Ambulatory Visit: Payer: PRIVATE HEALTH INSURANCE | Admitting: Bariatrics

## 2024-05-05 VITALS — BP 123/80 | HR 67 | Temp 98.2°F | Ht 64.0 in | Wt 212.0 lb

## 2024-05-05 DIAGNOSIS — I10 Essential (primary) hypertension: Secondary | ICD-10-CM

## 2024-05-05 DIAGNOSIS — E669 Obesity, unspecified: Secondary | ICD-10-CM

## 2024-05-05 DIAGNOSIS — Z6836 Body mass index (BMI) 36.0-36.9, adult: Secondary | ICD-10-CM

## 2024-05-05 DIAGNOSIS — E66812 Morbid (severe) obesity due to excess calories: Secondary | ICD-10-CM

## 2024-05-05 DIAGNOSIS — R632 Polyphagia: Secondary | ICD-10-CM | POA: Diagnosis not present

## 2024-05-05 MED ORDER — PHENTERMINE-TOPIRAMATE ER 7.5-46 MG PO CP24: 1.0000 | ORAL_CAPSULE | Freq: Every morning | ORAL | 0 refills | Status: DC

## 2024-05-05 NOTE — Progress Notes (Signed)
 WEIGHT SUMMARY AND BIOMETRICS  Weight Lost Since Last Visit: 3lb  Weight Gained Since Last Visit: 0   Vitals Temp: 98.2 F (36.8 C) BP: 123/80 Pulse Rate: 67 SpO2: 99 %   Anthropometric Measurements Height: 5' 4 (1.626 m) Weight: 212 lb (96.2 kg) BMI (Calculated): 36.37 Weight at Last Visit: 215lb Weight Lost Since Last Visit: 3lb Weight Gained Since Last Visit: 0 Starting Weight: 222lb Total Weight Loss (lbs): 10 lb (4.536 kg) Peak Weight: 227lb   Body Composition  Body Fat %: 45.5 % Fat Mass (lbs): 96.8 lbs Muscle Mass (lbs): 110.2 lbs Total Body Water (lbs): 75.6 lbs Visceral Fat Rating : 12   Other Clinical Data Fasting: no Labs: no Today's Visit #: 6 Starting Date: 01/15/24    OBESITY Karina Neal is here to discuss her progress with her obesity treatment plan along with follow-up of her obesity related diagnoses.    Nutrition Plan: the Category 2 plan + 100- 50% adherence.  Current exercise: walking  Interim History:  She is down another 3 lbs since her last visit.  Eating all of the food on the plan., Protein intake is as prescribed, Is not skipping meals, Water intake is adequate., Reports polyphagia, and Denies excessive cravings.   Pharmacotherapy: Karina Neal is on Qsymia  7.5/46 mg 1 capsule by mouth daily in am Adverse side effects: None Hunger is moderately controlled.  Cravings are moderately controlled.  Assessment/Plan:   Polyphagia Karina Neal endorses excessive hunger.  Medication(s): Metformin  500 mg daily and Qsymia  7.5-46 mg daily.  Effects of medication:  moderately controlled. Cravings are moderately controlled.   Plan: Medication(s): Qsymia  7.5/46 mg 1 capsule by mouth daily in am Will increase water, protein and fiber to help assuage hunger.  Will minimize foods that have a high glucose index/load to minimize reactive  hypoglycemia.   Hypertension Hypertension well controlled.  Medication(s): Hydrochlorothiazide  12.5 mg daily  BP Readings from Last 3 Encounters:  05/05/24 123/80  04/13/24 130/81  03/30/24 132/85   Lab Results  Component Value Date   CREATININE 0.73 01/15/2024   CREATININE 0.76 05/22/2023   CREATININE 0.76 02/12/2023   Lab Results  Component Value Date   GFR 90.74 05/22/2023   GFR 90.91 02/12/2023   GFR 81.23 01/17/2022    Plan: Continue all antihypertensives at current dosages. No added salt. Will keep sodium content to 1,500 mg or less per day.   She will check her blood sugar at home periodically and record.  She will continue to exercise adding in both cardiac and resistance.    Generalized Obesity: Current BMI BMI (Calculated): 36.37   Pharmacotherapy Plan Continue and refill  Qsymia  7.5/46 mg 1 capsule by mouth daily in am  Karina Neal is currently in the action stage of change. As such, her goal is to continue with weight loss efforts.  She has agreed to the Category 2 plan.  Exercise goals: All adults should avoid inactivity. Some physical activity is better than none, and adults who participate in any amount of physical activity gain some health benefits. She is doing a lot of walking and will get back into the gym.   Behavioral modification strategies: no meal skipping, meal planning , increase water intake, better snacking choices, planning for success, increasing vegetables, avoiding temptations, keep healthy foods in the home, weigh protein portions, measure portion sizes, and mindful eating.  Karina Neal has agreed to follow-up with our clinic in 4 weeks.   Objective:   VITALS: Per patient if applicable, see vitals. GENERAL: Alert and in no acute distress. CARDIOPULMONARY: No increased WOB. Speaking in clear sentences.  PSYCH: Pleasant and cooperative. Speech normal rate and rhythm. Affect is appropriate. Insight and judgement are appropriate. Attention is  focused, linear, and appropriate.  NEURO: Oriented as arrived to appointment on time with no prompting.   Attestation Statements:   This was prepared with the assistance of Engineer, civil (consulting).  Occasional wrong-word or sound-a-like substitutions may have occurred due to the inherent limitations of voice recognition    Karina Daring, DO

## 2024-05-07 ENCOUNTER — Other Ambulatory Visit: Payer: Self-pay | Admitting: Bariatrics

## 2024-06-03 ENCOUNTER — Encounter: Payer: Self-pay | Admitting: Bariatrics

## 2024-06-03 ENCOUNTER — Ambulatory Visit: Payer: PRIVATE HEALTH INSURANCE | Admitting: Bariatrics

## 2024-06-03 VITALS — BP 124/81 | HR 66 | Temp 97.7°F | Ht 64.0 in | Wt 208.0 lb

## 2024-06-03 DIAGNOSIS — Z6835 Body mass index (BMI) 35.0-35.9, adult: Secondary | ICD-10-CM

## 2024-06-03 DIAGNOSIS — E66812 Obesity, class 2: Secondary | ICD-10-CM

## 2024-06-03 DIAGNOSIS — E559 Vitamin D deficiency, unspecified: Secondary | ICD-10-CM | POA: Diagnosis not present

## 2024-06-03 DIAGNOSIS — R632 Polyphagia: Secondary | ICD-10-CM

## 2024-06-03 DIAGNOSIS — E669 Obesity, unspecified: Secondary | ICD-10-CM

## 2024-06-03 MED ORDER — PHENTERMINE-TOPIRAMATE ER 11.25-69 MG PO CP24: ORAL_CAPSULE | ORAL | 0 refills | Status: DC

## 2024-06-03 MED ORDER — PHENTERMINE-TOPIRAMATE ER 15-92 MG PO CP24: ORAL_CAPSULE | ORAL | 0 refills | Status: DC

## 2024-06-03 NOTE — Progress Notes (Signed)
 WEIGHT SUMMARY AND BIOMETRICS  Weight Lost Since Last Visit: 4lb  Weight Gained Since Last Visit: 0   Vitals Temp: 97.7 F (36.5 C) BP: 124/81 Pulse Rate: 66 SpO2: 100 %   Anthropometric Measurements Height: 5' 4 (1.626 m) Weight: 208 lb (94.3 kg) BMI (Calculated): 35.69 Weight at Last Visit: 212lb Weight Lost Since Last Visit: 4lb Weight Gained Since Last Visit: 0 Starting Weight: 222lb Total Weight Loss (lbs): 14 lb (6.35 kg) Peak Weight: 227lb   Body Composition  Body Fat %: 44.6 % Fat Mass (lbs): 93.2 lbs Muscle Mass (lbs): 109.8 lbs Total Body Water (lbs): 73.8 lbs Visceral Fat Rating : 12   Other Clinical Data Fasting: no Labs: no Today's Visit #: 7 Starting Date: 01/15/24    OBESITY Manila is here to discuss her progress with her obesity treatment plan along with follow-up of her obesity related diagnoses.    Nutrition Plan: the Category 2 plan + 100- 50% adherence.  Current exercise: Line dancing.  Interim History:  She is down 4 lbs since her last visit. She signed herself up for line dancing.  Protein intake is as prescribed, Is not skipping meals, and Water intake is adequate.   Pharmacotherapy: Chieko is on Qsymia  7.5/46 mg 1 capsule by mouth daily in am Adverse side effects: None Hunger is moderately controlled.  Cravings are moderately controlled.  Assessment/Plan:   Polyphagia Ixchel endorses excessive hunger.  She states that her appetite has increased since her last visit and also that she is having more cravings. Medication(s): Qsymia  Effects of medication (appetite) :  moderately controlled. Cravings are moderately controlled.   Plan: Medication(s): Increase Qsymia  11.25/69 mg 1 capsule by mouth daily in am and Qsymia  15/92 mg 1 capsule by mouth daily in am Will increase water, protein and fiber to help assuage  hunger.  Will minimize foods that have a high glucose index/load to minimize reactive hypoglycemia.   Vitamin D  Deficiency Vitamin D  is at goal of 50.  Most recent vitamin D  level was 20.3. She is on  prescription ergocalciferol  50,000 IU weekly. Lab Results  Component Value Date   VD25OH 20.3 (L) 01/15/2024   VD25OH 34.24 06/05/2022   VD25OH 32.37 02/21/2022    Plan: Refill prescription vitamin D  50,000 IU weekly.     Generalized Obesity: Current BMI BMI (Calculated): 35.69   Pharmacotherapy Plan Continue and increase dose  Qsymia  11.25/69 mg 1 capsule by mouth daily in am and Qsymia  15/92 mg 1 capsule by mouth daily in am  Canyon is currently in the action stage of change. As such, her goal is to continue with weight loss efforts.  She has agreed to the Category 2 plan + 100 calories.   Exercise goals: All adults should avoid inactivity. Some physical activity is better than none, and adults who participate in any amount of physical activity gain  some health benefits.  Behavioral modification strategies: increasing lean protein intake, decreasing simple carbohydrates , no meal skipping, meal planning , increase water intake, better snacking choices, increasing vegetables, increasing lower sugar fruits, decreasing sodium intake, avoiding temptations, weigh protein portions, measure portion sizes, and work on smaller portions.  Marybel has agreed to follow-up with our clinic in 4 weeks.     Objective:   VITALS: Per patient if applicable, see vitals. GENERAL: Alert and in no acute distress. CARDIOPULMONARY: No increased WOB. Speaking in clear sentences.  PSYCH: Pleasant and cooperative. Speech normal rate and rhythm. Affect is appropriate. Insight and judgement are appropriate. Attention is focused, linear, and appropriate.  NEURO: Oriented as arrived to appointment on time with no prompting.   Attestation Statements:   This was prepared with the assistance of Restaurant manager, fast food.  Occasional wrong-word or sound-a-like substitutions may have occurred due to the inherent limitations of voice recognition   Clayborne Daring, DO

## 2024-06-20 ENCOUNTER — Other Ambulatory Visit: Payer: Self-pay | Admitting: Bariatrics

## 2024-06-20 ENCOUNTER — Other Ambulatory Visit: Payer: Self-pay | Admitting: Primary Care

## 2024-06-20 DIAGNOSIS — I10 Essential (primary) hypertension: Secondary | ICD-10-CM

## 2024-06-20 NOTE — Telephone Encounter (Signed)
 Patient is due for CPE/follow up, this will be required prior to any further refills.  Please schedule, thank you!

## 2024-06-21 ENCOUNTER — Other Ambulatory Visit: Payer: Self-pay | Admitting: Bariatrics

## 2024-06-21 ENCOUNTER — Encounter: Payer: Self-pay | Admitting: Bariatrics

## 2024-06-21 ENCOUNTER — Other Ambulatory Visit (INDEPENDENT_AMBULATORY_CARE_PROVIDER_SITE_OTHER): Payer: Self-pay

## 2024-06-21 DIAGNOSIS — R7303 Prediabetes: Secondary | ICD-10-CM

## 2024-06-21 MED ORDER — METFORMIN HCL 500 MG PO TABS: 500.0000 mg | ORAL_TABLET | Freq: Every day | ORAL | 0 refills | Status: DC

## 2024-06-21 NOTE — Telephone Encounter (Signed)
 Ok to refill if pt makes an appt?   LAST APPOINTMENT DATE: 06/03/24 NEXT APPOINTMENT DATE: Pt to schedule   [id number] [place], [place] - [address] AT [facility] OF [address] [place] [address] [place] [address] Phone: [phone] Fax: [phone]  [id number] - [place], [place] - [address] [place] [address] Phone: [phone] Fax: [phone]  [id number] - [place], [place] - [address] AT [facility] [address] [place] [address] Phone: [phone] Fax: [phone]  Patient is requesting a refill of the following medications: Requested Prescriptions    No prescriptions requested or ordered in this encounter    Date last filled: 05/11/24 Previously prescribed by Dr Delores  Lab Results  Component Value Date   HGBA1C 6.0 (H) 01/15/2024   HGBA1C 6.0 05/22/2023   HGBA1C 5.9 08/27/2022   Lab Results  Component Value Date   LDLCALC 116 (H) 01/15/2024   CREATININE 0.73 01/15/2024   Lab Results  Component Value Date   VD25OH 20.3 (L) 01/15/2024   VD25OH 34.24 06/05/2022   VD25OH 32.37 02/21/2022    BP Readings from Last 3 Encounters:  06/03/24 124/81  05/05/24 123/80  04/13/24 130/81

## 2024-06-28 ENCOUNTER — Ambulatory Visit (INDEPENDENT_AMBULATORY_CARE_PROVIDER_SITE_OTHER): Payer: PRIVATE HEALTH INSURANCE | Admitting: Primary Care

## 2024-06-28 ENCOUNTER — Encounter: Payer: Self-pay | Admitting: Primary Care

## 2024-06-28 VITALS — BP 122/74 | HR 74 | Temp 97.3°F | Ht 64.0 in | Wt 210.0 lb

## 2024-06-28 DIAGNOSIS — Z0000 Encounter for general adult medical examination without abnormal findings: Secondary | ICD-10-CM | POA: Diagnosis not present

## 2024-06-28 DIAGNOSIS — Z1211 Encounter for screening for malignant neoplasm of colon: Secondary | ICD-10-CM

## 2024-06-28 DIAGNOSIS — I10 Essential (primary) hypertension: Secondary | ICD-10-CM

## 2024-06-28 DIAGNOSIS — G43009 Migraine without aura, not intractable, without status migrainosus: Secondary | ICD-10-CM | POA: Diagnosis not present

## 2024-06-28 DIAGNOSIS — F419 Anxiety disorder, unspecified: Secondary | ICD-10-CM

## 2024-06-28 DIAGNOSIS — E66812 Obesity, class 2: Secondary | ICD-10-CM

## 2024-06-28 DIAGNOSIS — Z1231 Encounter for screening mammogram for malignant neoplasm of breast: Secondary | ICD-10-CM

## 2024-06-28 DIAGNOSIS — K219 Gastro-esophageal reflux disease without esophagitis: Secondary | ICD-10-CM

## 2024-06-28 DIAGNOSIS — R7303 Prediabetes: Secondary | ICD-10-CM

## 2024-06-28 MED ORDER — FAMOTIDINE 20 MG PO TABS: 20.0000 mg | ORAL_TABLET | Freq: Two times a day (BID) | ORAL | 0 refills | Status: DC

## 2024-06-28 NOTE — Assessment & Plan Note (Signed)
 Controlled -Continue hydrochlorothiazide 12.5 mg daily

## 2024-06-28 NOTE — Assessment & Plan Note (Signed)
 Follow with healthy weight wellness.  Continue phentermine -topiramate  11.25-69 mg daily.

## 2024-06-28 NOTE — Assessment & Plan Note (Signed)
 Uncontrolled.  Start famotidine 20 mg once to twice daily. Prescription sent to pharmacy.

## 2024-06-28 NOTE — Assessment & Plan Note (Addendum)
 Immunizations UTD. Influenza vaccine provided today.  Pap smear UTD.  Follows with GYN Mammogram due, orders placed. Colonoscopy due, referral placed to GI  Discussed the importance of a healthy diet and regular exercise in order for weight loss, and to reduce the risk of further co-morbidity.  Exam stable. Labs reviewed from May 2025  Follow up in 1 year for repeat physical.

## 2024-06-28 NOTE — Assessment & Plan Note (Signed)
 Resolved!  Remain off sertraline  50 mg daily for now. Continue to monitor

## 2024-06-28 NOTE — Patient Instructions (Signed)
 Complete your mammogram in November as scheduled.  You will receive a phone call regarding the colonoscopy.  You may take famotidine 20 mg once to twice daily for heartburn.  It was a pleasure to see you today!

## 2024-06-28 NOTE — Assessment & Plan Note (Signed)
 Improving.  Continue metformin  500 mg daily. Reviewed A1c from May 2025.

## 2024-06-28 NOTE — Assessment & Plan Note (Signed)
 No concerns today. Continue to monitor.

## 2024-06-28 NOTE — Progress Notes (Signed)
 Subjective:    Patient ID: Lamont Cary, female    DOB: Feb 25, 1971, 53 y.o.   MRN: [id number]  Jeniece Hannis is a very pleasant 53 y.o. female who presents today for complete physical and follow up of chronic conditions.  Immunizations: -Tetanus: Completed in 2017 -Influenza: Influenza vaccine provided today.  -Shingles: Completed Shingrix  series  Diet: Fair diet.  Exercise: No regular exercise.  Eye exam: Completed > 1 year ago  Dental exam: Completes semi-annually    Pap Smear: Completed in 2021, follows with GYN Mammogram: Completed in July 2024  Colonoscopy: Never completed   BP Readings from Last 3 Encounters:  06/28/24 122/74  06/03/24 124/81  05/05/24 123/80   Wt Readings from Last 3 Encounters:  06/28/24 210 lb (95.3 kg)  06/03/24 208 lb (94.3 kg)  05/05/24 212 lb (96.2 kg)      Review of Systems  Constitutional:  Negative for unexpected weight change.  HENT:  Negative for rhinorrhea.   Respiratory:  Negative for cough and shortness of breath.   Cardiovascular:  Negative for chest pain.  Gastrointestinal:  Negative for constipation and diarrhea.  Genitourinary:  Negative for difficulty urinating.  Musculoskeletal:  Negative for arthralgias and myalgias.  Skin:  Negative for rash.  Allergic/Immunologic: Negative for environmental allergies.  Neurological:  Negative for dizziness, numbness and headaches.  Psychiatric/Behavioral:  The patient is not nervous/anxious.          Past Medical History:  Diagnosis Date   Abnormal uterine bleeding 08/27/2022   Anxiety    Asthma    Back pain    Blood pressure elevated    Chicken pox    Constrictive jewelry of finger 10/31/2020   Frequent headaches    Hypertension    Left sided numbness 04/23/2015   Paresthesia 01/17/2022    Social History   Socioeconomic History   Marital status: Married    Spouse name: Not on file   Number of children: Not on file   Years of education: Not on file   Highest  education level: 12th grade  Occupational History   Not on file  Tobacco Use   Smoking status: Never   Smokeless tobacco: Never  Vaping Use   Vaping status: Never Used  Substance and Sexual Activity   Alcohol use: Yes    Comment: social   Drug use: No   Sexual activity: Not on file  Other Topics Concern   Not on file  Social History Narrative   ** Merged History Encounter **       ** Merged History Encounter **       Social Drivers of Corporate investment banker Strain: Low Risk  (09/30/2023)   Overall Financial Resource Strain (CARDIA)    Difficulty of Paying Living Expenses: Not hard at all  Food Insecurity: No Food Insecurity (09/30/2023)   Hunger Vital Sign    Worried About Running Out of Food in the Last Year: Never true    Ran Out of Food in the Last Year: Never true  Transportation Needs: No Transportation Needs (09/30/2023)   PRAPARE - Administrator, Civil Service (Medical): No    Lack of Transportation (Non-Medical): No  Physical Activity: Sufficiently Active (09/30/2023)   Exercise Vital Sign    Days of Exercise per Week: 7 days    Minutes of Exercise per Session: 150+ min  Stress: Stress Concern Present (09/30/2023)   [employer] of Occupational Health - Occupational Stress Questionnaire    Feeling  of Stress : Very much  Social Connections: Unknown (09/30/2023)   Social Connection and Isolation Panel    Frequency of Communication with Friends and Family: Twice a week    Frequency of Social Gatherings with Friends and Family: Once a week    Attends Religious Services: Patient declined    Database administrator or Organizations: Patient declined    Attends Engineer, structural: Not on file    Marital Status: Separated  Intimate Partner Violence: Not on file    Past Surgical History:  Procedure Laterality Date   APPENDECTOMY     CESAREAN SECTION     CESAREAN SECTION     EYE SURGERY      Family History  Problem Relation Age of  Onset   Hyperlipidemia Mother    Hypertension Mother    Diabetes Mother    Sleep apnea Mother    Hyperlipidemia Father    Hypertension Father    Diabetes Father    Sleep apnea Father    Hypertension Sister    Hypertension Brother    Breast cancer Neg Hx     Allergies  Allergen Reactions   Shellfish Allergy Anaphylaxis    Throat swelling, dyspnea   Penicillins Hives    Has patient had a PCN reaction causing immediate rash, facial/tongue/throat swelling, SOB or lightheadedness with hypotension: Unknown Has patient had a PCN reaction causing severe rash involving mucus membranes or skin necrosis: No Has patient had a PCN reaction that required hospitalization: No Has patient had a PCN reaction occurring within the last 10 years: No If all of the above answers are NO, then may proceed with Cephalosporin use.     Current Outpatient Medications on File Prior to Visit  Medication Sig Dispense Refill   fluticasone  (FLONASE ) 50 MCG/ACT nasal spray PLACE 1 SPRAY INTO BOTH NOSTRILS 2 (TWO) TIMES DAILY 48 mL 1   hydrochlorothiazide  (HYDRODIURIL ) 12.5 MG tablet TAKE 1 TABLET (12.5 MG TOTAL) BY MOUTH DAILY FOR BLOOD PRESSURE 30 tablet 0   hydrOXYzine  (ATARAX ) 10 MG tablet TAKE 1-2 TABLETS (10-20 MG TOTAL) BY MOUTH AT BEDTIME AS NEEDED FOR ANXIETY. 180 tablet 0   metFORMIN  (GLUCOPHAGE ) 500 MG tablet Take 1 tablet (500 mg total) by mouth daily with supper. 30 tablet 0   Phentermine -Topiramate  ER 11.25-69 MG CP24 1 capsule in the am 14 capsule 0   Phentermine -Topiramate  ER 15-92 MG CP24 1 capsule daily in the am 30 capsule 0   vitamin B-12 (CYANOCOBALAMIN ) 100 MCG tablet Take 100 mcg by mouth daily.     Vitamin D , Ergocalciferol , (DRISDOL ) 1.25 MG (50000 UNIT) CAPS capsule Take 1 capsule (50,000 Units total) by mouth every 7 (seven) days. 5 capsule 0   cholecalciferol (VITAMIN D3) 25 MCG (1000 UNIT) tablet Take 1,000 Units by mouth daily. (Patient not taking: Reported on 06/28/2024)     No  current facility-administered medications on file prior to visit.    BP 122/74   Pulse 74   Temp (!) 97.3 F (36.3 C) (Temporal)   Ht 5' 4 (1.626 m)   Wt 210 lb (95.3 kg)   LMP  (LMP Unknown)   SpO2 97%   BMI 36.05 kg/m  Objective:   Physical Exam HENT:     Right Ear: Tympanic membrane and ear canal normal.     Left Ear: Tympanic membrane and ear canal normal.  Eyes:     Pupils: Pupils are equal, round, and reactive to light.  Cardiovascular:  Rate and Rhythm: Normal rate and regular rhythm.  Pulmonary:     Effort: Pulmonary effort is normal.     Breath sounds: Normal breath sounds.  Abdominal:     General: Bowel sounds are normal.     Palpations: Abdomen is soft.     Tenderness: There is no abdominal tenderness.  Musculoskeletal:        General: Normal range of motion.     Cervical back: Neck supple.  Skin:    General: Skin is warm and dry.  Neurological:     Mental Status: She is alert and oriented to person, place, and time.     Cranial Nerves: No cranial nerve deficit.     Deep Tendon Reflexes:     Reflex Scores:      Patellar reflexes are 2+ on the right side and 2+ on the left side. Psychiatric:        Mood and Affect: Mood normal.     Physical Exam        Assessment & Plan:  Preventative health care Assessment & Plan: Immunizations UTD. Influenza vaccine provided today.  Pap smear UTD.  Follows with GYN Mammogram due, orders placed. Colonoscopy due, referral placed to GI  Discussed the importance of a healthy diet and regular exercise in order for weight loss, and to reduce the risk of further co-morbidity.  Exam stable. Labs reviewed from May 2025  Follow up in 1 year for repeat physical.    Screening for colon cancer -     Ambulatory referral to Gastroenterology  Migraine without aura and without status migrainosus, not intractable Assessment & Plan: No concerns today. Continue to monitor.   Essential hypertension Assessment  & Plan: Controlled.  Continue hydrochlorothiazide  12.5 mg daily.   Gastroesophageal reflux disease, unspecified whether esophagitis present Assessment & Plan: Uncontrolled.  Start famotidine 20 mg once to twice daily. Prescription sent to pharmacy.  Orders: -     Famotidine; Take 1 tablet (20 mg total) by mouth 2 (two) times daily. for heartburn.  Dispense: 180 tablet; Refill: 0  Prediabetes Assessment & Plan: Improving.  Continue metformin  500 mg daily. Reviewed A1c from May 2025.   Anxiety Assessment & Plan: Resolved!  Remain off sertraline  50 mg daily for now. Continue to monitor   Screening mammogram for breast cancer -     3D Screening Mammogram, Left and Right; Future  Obesity, Class II, BMI 35-39.9 Assessment & Plan: Follow with healthy weight wellness.  Continue phentermine -topiramate  11.25-69 mg daily.     Assessment and Plan Assessment & Plan         Comer MARLA Gaskins, NP    History of Present Illness

## 2024-07-01 ENCOUNTER — Ambulatory Visit: Payer: PRIVATE HEALTH INSURANCE | Admitting: Bariatrics

## 2024-07-05 ENCOUNTER — Encounter: Payer: Self-pay | Admitting: Bariatrics

## 2024-07-05 ENCOUNTER — Ambulatory Visit: Payer: PRIVATE HEALTH INSURANCE | Admitting: Bariatrics

## 2024-07-05 VITALS — BP 118/80 | HR 85 | Temp 97.8°F | Ht 64.0 in | Wt 206.0 lb

## 2024-07-05 DIAGNOSIS — E66812 Obesity, class 2: Secondary | ICD-10-CM | POA: Diagnosis not present

## 2024-07-05 DIAGNOSIS — R7303 Prediabetes: Secondary | ICD-10-CM

## 2024-07-05 DIAGNOSIS — R632 Polyphagia: Secondary | ICD-10-CM | POA: Diagnosis not present

## 2024-07-05 DIAGNOSIS — Z6835 Body mass index (BMI) 35.0-35.9, adult: Secondary | ICD-10-CM

## 2024-07-05 DIAGNOSIS — E559 Vitamin D deficiency, unspecified: Secondary | ICD-10-CM | POA: Diagnosis not present

## 2024-07-05 MED ORDER — METFORMIN HCL 500 MG PO TABS: 500.0000 mg | ORAL_TABLET | Freq: Every day | ORAL | 0 refills | Status: DC

## 2024-07-05 MED ORDER — VITAMIN D (ERGOCALCIFEROL) 1.25 MG (50000 UNIT) PO CAPS: 50000.0000 [IU] | ORAL_CAPSULE | ORAL | 0 refills | Status: DC

## 2024-07-05 NOTE — Progress Notes (Signed)
 WEIGHT SUMMARY AND BIOMETRICS  Weight Lost Since Last Visit: 2lb  Weight Gained Since Last Visit: 0   Vitals Temp: 97.8 F (36.6 C) BP: 118/80 Pulse Rate: 85 SpO2: 97 %   Anthropometric Measurements Height: 5' 4 (1.626 m) Weight: 206 lb (93.4 kg) BMI (Calculated): 35.34 Weight at Last Visit: 208lb Weight Lost Since Last Visit: 2lb Weight Gained Since Last Visit: 0 Starting Weight: 222lb Total Weight Loss (lbs): 16 lb (7.258 kg) Peak Weight: 227lb   Body Composition  Body Fat %: 44 % Fat Mass (lbs): 90.6 lbs Muscle Mass (lbs): 109.6 lbs Total Body Water (lbs): 74 lbs Visceral Fat Rating : 12   Other Clinical Data Fasting: no Labs: no Today's Visit #: 8 Starting Date: 01/15/24    OBESITY Karina Neal is here to discuss her progress with her obesity treatment plan along with follow-up of her obesity related diagnoses.    Nutrition Plan: the Category 2 + 100 plan - 60% adherence.  Current exercise: none  Interim History:  She is down 2 lbs since her last visit. Her mother has been sick and she has been stressed.  Eating all of the food on the plan., Protein intake is as prescribed, and Water intake is adequate.   Pharmacotherapy: Torra is on Qsymia  15/92 mg 1 capsule by mouth daily in am Adverse side effects: None Hunger is moderately controlled.  Cravings are moderately controlled.  Assessment/Plan:   Prediabetes Last A1c was 6.0  Medication(s): Metformin  500 mg in the evening. Qsymia  15/92 mg 1 capsule by mouth daily in am Lab Results  Component Value Date   HGBA1C 6.0 (H) 01/15/2024   HGBA1C 6.0 05/22/2023   HGBA1C 5.9 08/27/2022   HGBA1C 6.2 06/05/2022   HGBA1C 6.0 01/17/2022   Lab Results  Component Value Date   INSULIN  14.5 01/15/2024    Plan: Will minimize all refined carbohydrates both sweets and starches.  Will work on  the plan and exercise.  Will continue exercise, both aerobic and resistance training.  Will keep protein, water, and fiber intake high.  Increase Polyunsaturated and Monounsaturated fats to increase satiety and encourage weight loss.  Continue Qsymia  15/92 mg 1 capsule by mouth daily in am   Polyphagia [name] endorses excessive hunger.  Medication(s): Qsymia  Effects of medication: (appetite)  well controlled. Cravings are moderately controlled.   Plan: Medication(s): Qsymia  15/92 mg 1 capsule by mouth daily in am Will increase water, protein and fiber to help assuage hunger.  Will minimize foods that have a high glucose index/load to minimize reactive hypoglycemia.  Will increase low sugar fruits and non-starchy vegetables.   Vitamin D  Deficiency Vitamin D  is not at goal of 50.  Most recent vitamin D  level was 20.3. She is on  prescription ergocalciferol  50,000 IU weekly. Lab Results  Component Value Date   VD25OH 20.3 (L) 01/15/2024   VD25OH  34.24 06/05/2022   VD25OH 32.37 02/21/2022    Plan: Refill prescription vitamin D  50,000 IU weekly.     Generalized Obesity: Current BMI BMI (Calculated): 35.34   Pharmacotherapy Plan Continue  Qsymia  15/92 mg 1 capsule by mouth daily in am  Zylee is currently in the action stage of change. As such, her goal is to continue with weight loss efforts.  She has agreed to the Category 2 plan + 100 calories   Exercise goals: All adults should avoid inactivity. Some physical activity is better than none, and adults who participate in any amount of physical activity gain some health benefits.  Behavioral modification strategies: increasing lean protein intake, no meal skipping, meal planning , planning for success, increasing vegetables, weigh protein portions, measure portion sizes, and mindful eating.  Lillybeth has agreed to follow-up with our clinic in 4 weeks.     Objective:   VITALS: Per patient if applicable, see vitals. GENERAL:  Alert and in no acute distress. CARDIOPULMONARY: No increased WOB. Speaking in clear sentences.  PSYCH: Pleasant and cooperative. Speech normal rate and rhythm. Affect is appropriate. Insight and judgement are appropriate. Attention is focused, linear, and appropriate.  NEURO: Oriented as arrived to appointment on time with no prompting.   Attestation Statements:   This was prepared with the assistance of Engineer, Civil (consulting).  Occasional wrong-word or sound-a-like substitutions may have occurred due to the inherent limitations of voice recognition   Clayborne Daring, DO

## 2024-07-13 ENCOUNTER — Other Ambulatory Visit: Payer: Self-pay | Admitting: Primary Care

## 2024-07-13 DIAGNOSIS — I10 Essential (primary) hypertension: Secondary | ICD-10-CM

## 2024-07-19 ENCOUNTER — Ambulatory Visit: Payer: PRIVATE HEALTH INSURANCE | Admitting: Bariatrics

## 2024-07-19 ENCOUNTER — Encounter: Payer: Self-pay | Admitting: Bariatrics

## 2024-07-19 VITALS — BP 117/81 | HR 93 | Ht 64.0 in | Wt 204.0 lb

## 2024-07-19 DIAGNOSIS — E669 Obesity, unspecified: Secondary | ICD-10-CM

## 2024-07-19 DIAGNOSIS — R7303 Prediabetes: Secondary | ICD-10-CM | POA: Diagnosis not present

## 2024-07-19 DIAGNOSIS — R632 Polyphagia: Secondary | ICD-10-CM | POA: Diagnosis not present

## 2024-07-19 DIAGNOSIS — Z6835 Body mass index (BMI) 35.0-35.9, adult: Secondary | ICD-10-CM

## 2024-07-19 MED ORDER — PHENTERMINE-TOPIRAMATE ER 15-92 MG PO CP24: ORAL_CAPSULE | ORAL | 0 refills | Status: DC

## 2024-07-19 MED ORDER — VITAMIN D (ERGOCALCIFEROL) 1.25 MG (50000 UNIT) PO CAPS: 50000.0000 [IU] | ORAL_CAPSULE | ORAL | 0 refills | Status: DC

## 2024-07-19 NOTE — Progress Notes (Signed)
 WEIGHT SUMMARY AND BIOMETRICS  Weight Lost Since Last Visit: 2lb  Weight Gained Since Last Visit: 0   Vitals BP: 117/81 Pulse Rate: 93 SpO2: 98 %   Anthropometric Measurements Height: 5' 4 (1.626 m) Weight: 204 lb (92.5 kg) BMI (Calculated): 35 Weight at Last Visit: 206lb Weight Lost Since Last Visit: 2lb Weight Gained Since Last Visit: 0 Starting Weight: 222lb Total Weight Loss (lbs): 18 lb (8.165 kg) Peak Weight: 227lb   Body Composition  Body Fat %: 43.9 % Fat Mass (lbs): 89.6 lbs Muscle Mass (lbs): 108.6 lbs Total Body Water (lbs): 71.4 lbs Visceral Fat Rating : 12   Other Clinical Data Fasting: no Labs: no Today's Visit #: 9 Starting Date: 01/15/24    OBESITY Karina Neal is here to discuss her progress with her obesity treatment plan along with follow-up of her obesity related diagnoses.    Nutrition Plan: the Category 2 plan + 100 - 60% adherence.  Current exercise: walking  Interim History:  She is down another 2 lbs since her last visit.  Eating all of the food on the plan., Protein intake is as prescribed, and Water intake is adequate.   Pharmacotherapy: Karina Neal is on Qsymia  11.25/69 mg 1 capsule by mouth daily in am Adverse side effects: None Hunger is moderately controlled.  Cravings are moderately controlled.  Assessment/Plan:   Prediabetes Last A1c was 6.0  Medication(s): Qsymia  15/92 mg 1 capsule by mouth daily in am Lab Results  Component Value Date   HGBA1C 6.0 (H) 01/15/2024   HGBA1C 6.0 05/22/2023   HGBA1C 5.9 08/27/2022   HGBA1C 6.2 06/05/2022   HGBA1C 6.0 01/17/2022   Lab Results  Component Value Date   INSULIN  14.5 01/15/2024    Plan: Will minimize all refined carbohydrates both sweets and starches.  Will work on the plan and exercise.  Consider both aerobic and resistance training.  Will keep protein,  water, and fiber intake high.  Increase Polyunsaturated and Monounsaturated fats to increase satiety and encourage weight loss.  Aim for 7 to 9 hours of sleep nightly.  Continue and refill Qsymia  15/92 mg 1 capsule by mouth daily in am   Polyphagia Karina Neal endorses excessive hunger.  Medication(s): Qsymia  15/92 mg Effects of medication (appetite)  moderately controlled. Cravings are moderately controlled.   Plan: Medication(s): Qsymia  15/92 mg 1 capsule by mouth daily in am Will increase water, protein and fiber to help assuage hunger.  Will minimize foods that have a high glucose index/load to minimize reactive hypoglycemia.  Will pack her lunch for work.   Generalized Obesity: Current BMI BMI (Calculated): 35   Pharmacotherapy Plan Continue and refill  Qsymia  15/92 mg 1 capsule by mouth daily in am  Karina Neal is currently in the action stage of change. As such, her goal is to continue with weight loss efforts.  She has agreed to the Category  2 plan + 100 calories.   Exercise goals: For substantial health benefits, adults should do at least 150 minutes (2 hours and 30 minutes) a week of moderate-intensity, or 75 minutes (1 hour and 15 minutes) a week of vigorous-intensity aerobic physical activity, or an equivalent combination of moderate- and vigorous-intensity aerobic activity. Aerobic activity should be performed in episodes of at least 10 minutes, and preferably, it should be spread throughout the week.  Behavioral modification strategies: increasing lean protein intake, meal planning , increase water intake, better snacking choices, planning for success, increasing vegetables, increasing fiber rich foods, weigh protein portions, measure portion sizes, and work on smaller portions.  Karina Neal has agreed to follow-up with our clinic in 4 weeks for an IC and fasting labs.     Objective:   VITALS: Per patient if applicable, see vitals. GENERAL: Alert and in no acute  distress. CARDIOPULMONARY: No increased WOB. Speaking in clear sentences.  PSYCH: Pleasant and cooperative. Speech normal rate and rhythm. Affect is appropriate. Insight and judgement are appropriate. Attention is focused, linear, and appropriate.  NEURO: Oriented as arrived to appointment on time with no prompting.   Attestation Statements:   This was prepared with the assistance of Engineer, Civil (consulting).  Occasional wrong-word or sound-a-like substitutions may have occurred due to the inherent limitations of voice recognition   Clayborne Daring, DO

## 2024-07-20 ENCOUNTER — Ambulatory Visit: Payer: Self-pay

## 2024-07-20 ENCOUNTER — Ambulatory Visit
Admission: EM | Admit: 2024-07-20 | Discharge: 2024-07-20 | Disposition: A | Discharge status: Home / Self Care | Payer: PRIVATE HEALTH INSURANCE | Attending: Emergency Medicine | Admitting: Emergency Medicine

## 2024-07-20 ENCOUNTER — Encounter: Payer: Self-pay | Admitting: Emergency Medicine

## 2024-07-20 ENCOUNTER — Ambulatory Visit
Admission: EM | Admit: 2024-07-20 | Discharge: 2024-07-20 | Discharge status: Against Medical Advice | Payer: PRIVATE HEALTH INSURANCE

## 2024-07-20 DIAGNOSIS — H66001 Acute suppurative otitis media without spontaneous rupture of ear drum, right ear: Secondary | ICD-10-CM | POA: Diagnosis not present

## 2024-07-20 DIAGNOSIS — J029 Acute pharyngitis, unspecified: Secondary | ICD-10-CM | POA: Diagnosis present

## 2024-07-20 DIAGNOSIS — J069 Acute upper respiratory infection, unspecified: Secondary | ICD-10-CM | POA: Diagnosis present

## 2024-07-20 LAB — POCT RAPID STREP A (OFFICE): Rapid Strep A Screen: NEGATIVE

## 2024-07-20 MED ORDER — PROMETHAZINE-DM 6.25-15 MG/5ML PO SYRP: 5.0000 mL | ORAL_SOLUTION | Freq: Four times a day (QID) | ORAL | 0 refills | Status: DC | PRN

## 2024-07-20 MED ORDER — AZITHROMYCIN 250 MG PO TABS: 250.0000 mg | ORAL_TABLET | Freq: Every day | ORAL | 0 refills | Status: DC

## 2024-07-20 MED ORDER — BENZONATATE 100 MG PO CAPS: 200.0000 mg | ORAL_CAPSULE | Freq: Three times a day (TID) | ORAL | 0 refills | Status: DC

## 2024-07-20 MED ORDER — IPRATROPIUM BROMIDE 0.06 % NA SOLN: 2.0000 | Freq: Four times a day (QID) | NASAL | 12 refills | Status: AC

## 2024-07-20 NOTE — Telephone Encounter (Signed)
 Noted

## 2024-07-20 NOTE — ED Provider Notes (Signed)
 [facility]    CSN: [id number] Arrival date & time: 07/20/24  1243      History   Chief Complaint Chief Complaint  Patient presents with   Otalgia   Sore Throat   Cough    HPI Karina Neal is a 53 y.o. female.   HPI  53 year old female with past medical history significant for CVA, migraine headaches, GERD, essential hypertension, obesity, thrombocytopenia, prediabetes, vitamin D  deficiency presents for evaluation of respiratory symptoms that began 5 days ago and include fever for the last 2 days with a Tmax of 101.2, nasal congestion with clear nasal discharge, right ear pain, sore throat, productive cough of yellow sputum, and some shortness of breath.  She denies any wheezing or known sick contacts.  She did travel to [place] and visit the casino the day before her symptoms started.  Past Medical History:  Diagnosis Date   Abnormal uterine bleeding 08/27/2022   Anxiety    Asthma    Back pain    Blood pressure elevated    Chicken pox    Constrictive jewelry of finger 10/31/2020   Frequent headaches    Hypertension    Left sided numbness 04/23/2015   Paresthesia 01/17/2022    Patient Active Problem List   Diagnosis Date Noted   Tendonitis of wrist, right 10/21/2023   Chronic shoulder pain 01/23/2023   Anxiety 08/27/2022   Prediabetes 03/05/2022   Vitamin D  deficiency 03/05/2022   Sleep disturbance 01/17/2022   Obesity, Class II, BMI 35-39.9 12/18/2021   Chronic heel pain 05/10/2021   Thrombocytopenia 03/27/2021   Essential hypertension 03/02/2021   GERD (gastroesophageal reflux disease) 01/02/2021   Preventative health care 04/10/2016   Migraines    History of stroke 04/23/2015    Past Surgical History:  Procedure Laterality Date   APPENDECTOMY     CESAREAN SECTION     CESAREAN SECTION     EYE SURGERY      OB History     Gravida  3   Para      Term      Preterm      AB      Living         SAB      IAB      Ectopic       Multiple      Live Births               Home Medications    Prior to Admission medications   Medication Sig Start Date End Date Taking? Authorizing Provider  azithromycin  (ZITHROMAX  Z-PAK) 250 MG tablet Take 1 tablet (250 mg total) by mouth daily. Take 2 tablets on the first day and then 1 tablet daily thereafter for a total of 5 days of treatment. 07/20/24  Yes Bernardino Ditch, NP  benzonatate  (TESSALON ) 100 MG capsule Take 2 capsules (200 mg total) by mouth every 8 (eight) hours. 07/20/24  Yes Bernardino Ditch, NP  ipratropium (ATROVENT) 0.06 % nasal spray Place 2 sprays into both nostrils 4 (four) times daily. 07/20/24  Yes Bernardino Ditch, NP  promethazine-dextromethorphan (PROMETHAZINE-DM) 6.25-15 MG/5ML syrup Take 5 mLs by mouth 4 (four) times daily as needed. 07/20/24  Yes Bernardino Ditch, NP  cholecalciferol (VITAMIN D3) 25 MCG (1000 UNIT) tablet Take 1,000 Units by mouth daily. Patient not taking: Reported on 07/19/2024    [provider]  famotidine (PEPCID) 20 MG tablet Take 1 tablet (20 mg total) by mouth 2 (two) times daily. for heartburn.  06/28/24   Clark, Katherine K, NP  fluticasone  (FLONASE ) 50 MCG/ACT nasal spray PLACE 1 SPRAY INTO BOTH NOSTRILS 2 (TWO) TIMES DAILY 06/17/22   Clark, Katherine K, NP  hydrochlorothiazide  (HYDRODIURIL ) 12.5 MG tablet TAKE 1 TABLET (12.5 MG TOTAL) BY MOUTH DAILY FOR BLOOD PRESSURE 07/13/24   Clark, Katherine K, NP  hydrOXYzine  (ATARAX ) 10 MG tablet TAKE 1-2 TABLETS (10-20 MG TOTAL) BY MOUTH AT BEDTIME AS NEEDED FOR ANXIETY. 06/24/23   Clark, Katherine K, NP  metFORMIN  (GLUCOPHAGE ) 500 MG tablet Take 1 tablet (500 mg total) by mouth daily with supper. 07/05/24   Delores Shields A, DO  Phentermine -Topiramate  ER 15-92 MG CP24 1 capsule daily in the am 07/19/24   Delores Shields A, DO  vitamin B-12 (CYANOCOBALAMIN ) 100 MCG tablet Take 100 mcg by mouth daily.    [provider]  Vitamin D , Ergocalciferol , (DRISDOL ) 1.25 MG (50000 UNIT) CAPS  capsule Take 1 capsule (50,000 Units total) by mouth every 7 (seven) days. 07/19/24   Delores Shields LABOR, DO    Family History Family History  Problem Relation Age of Onset   Hyperlipidemia Mother    Hypertension Mother    Diabetes Mother    Sleep apnea Mother    Hyperlipidemia Father    Hypertension Father    Diabetes Father    Sleep apnea Father    Hypertension Sister    Hypertension Brother    Breast cancer Neg Hx     Social History Social History   Tobacco Use   Smoking status: Never   Smokeless tobacco: Never  Vaping Use   Vaping status: Never Used  Substance Use Topics   Alcohol use: Yes    Comment: social   Drug use: No     Allergies   Shellfish allergy and Penicillins   Review of Systems Review of Systems  Constitutional:  Positive for fever.  HENT:  Positive for congestion, ear pain, rhinorrhea and sore throat.   Respiratory:  Positive for cough and shortness of breath. Negative for wheezing.      Physical Exam Triage Vital Signs ED Triage Vitals  Encounter Vitals Group     BP      Girls Systolic BP Percentile      Girls Diastolic BP Percentile      Boys Systolic BP Percentile      Boys Diastolic BP Percentile      Pulse      Resp      Temp      Temp src      SpO2      Weight      Height      Head Circumference      Peak Flow      Pain Score      Pain Loc      Pain Education      Exclude from Growth Chart    No data found.  Updated Vital Signs BP 122/84 (BP Location: Right Arm)   Pulse 82   Temp 98.5 F (36.9 C) (Oral)   Resp 18   Wt 204 lb (92.5 kg)   LMP  (LMP Unknown)   SpO2 99%   BMI 35.02 kg/m   Visual Acuity Right Eye Distance:   Left Eye Distance:   Bilateral Distance:    Right Eye Near:   Left Eye Near:    Bilateral Near:     Physical Exam Vitals and nursing note reviewed.  Constitutional:      Appearance: Normal appearance.  She is ill-appearing.  HENT:     Head: Normocephalic and atraumatic.     Right Ear:  Ear canal and external ear normal. There is no impacted cerumen.     Left Ear: Tympanic membrane, ear canal and external ear normal. There is no impacted cerumen.     Ears:     Comments: Right TM is erythematous and injected.  Marked tenderness with movement of the auricle of the ear.    Nose: Congestion and rhinorrhea present.     Comments: This mucosa is edematous and erythematous with scant clear discharge in both nares.    Mouth/Throat:     Mouth: Mucous membranes are moist.     Pharynx: Oropharynx is clear. Posterior oropharyngeal erythema present. No oropharyngeal exudate.     Comments: Erythema to bilateral tonsillar pillars and soft palate.  Tonsillar pillars are 1+ edematous.  No visible exudate. Cardiovascular:     Rate and Rhythm: Normal rate and regular rhythm.     Pulses: Normal pulses.     Heart sounds: Normal heart sounds. No murmur heard.    No friction rub. No gallop.  Pulmonary:     Effort: Pulmonary effort is normal.     Breath sounds: Normal breath sounds. No wheezing, rhonchi or rales.  Musculoskeletal:     Cervical back: Normal range of motion and neck supple. No tenderness.  Lymphadenopathy:     Cervical: No cervical adenopathy.  Skin:    General: Skin is warm and dry.     Capillary Refill: Capillary refill takes less than 2 seconds.     Findings: No rash.  Neurological:     General: No focal deficit present.     Mental Status: She is alert and oriented to person, place, and time.      UC Treatments / Results  Labs (all labs ordered are listed, but only abnormal results are displayed) Labs Reviewed  POCT RAPID STREP A (OFFICE) - Normal  CULTURE, GROUP A STREP [facility])    EKG   Radiology No results found.  Procedures Procedures (including critical care time)  Medications Ordered in UC Medications - No data to display  Initial Impression / Assessment and Plan / UC Course  I have reviewed the triage vital signs and the nursing  notes.  Pertinent labs & imaging results that were available during my care of the patient were reviewed by me and considered in my medical decision making (see chart for details).   Patient is a pleasant, though mildly ill-appearing 53 year old female presenting for evaluation of 5 days with respiratory symptoms outlined HPI above.  Her physical exam does reveal inflammation of her upper respiratory tract as evidenced by inflamed nasal mucosa with clear nasal discharge.  She does have edematous and erythematous tonsillar pillars as well as erythema to her soft palate but no visible exudate.  Otoscopic exam reveals cerumen in both external auditory canals though I am able to clearly visualize both tympanic membranes.  The right TM is erythematous and injected.  Cardiopulmonary exam reveals clear lung sounds in all fields.  Given that she has had symptoms for 5 days, not tested for COVID or influenza.  I will order a rapid strep.  Rapid strep is negative.  I will send swab for culture.  I will discharge patient on the diagnosis of URI a with cough and congestion and right otitis media.  Given that she has an allergy to penicillin which elicits hives I will discharge her home on azithromycin .  Also Atrovent nasal spray for nasal congestion and Tessalon  Perles and Promethazine DM cough syrup for cough and congestion.  Strep culture negative.   Final Clinical Impressions(s) / UC Diagnoses   Final diagnoses:  Acute pharyngitis, unspecified etiology  URI with cough and congestion  Non-recurrent acute suppurative otitis media of right ear without spontaneous rupture of tympanic membrane     Discharge Instructions      Your strep test today was negative but I am consider swab for culture.  Take the azithromycin  once daily for 5 days for treatment of your upper respiratory tract infection and your right ear infection.  Use over-the-counter Tylenol  and/or ibuprofen  according to the package  instructions as needed for any fever or pain.  Use the Atrovent nasal spray, 2 squirts in each nostril every 6 hours, as needed for runny nose and postnasal drip.  Use the Tessalon  Perles every 8 hours during the day.  Take them with a small sip of water.  They may give you some numbness to the base of your tongue or a metallic taste in your mouth, this is normal.  Use the Promethazine DM cough syrup at bedtime for cough and congestion.  It will make you drowsy so do not take it during the day.  Return for reevaluation or see your primary care provider for any new or worsening symptoms.      ED Prescriptions     Medication Sig Dispense Auth. Provider   azithromycin  (ZITHROMAX  Z-PAK) 250 MG tablet Take 1 tablet (250 mg total) by mouth daily. Take 2 tablets on the first day and then 1 tablet daily thereafter for a total of 5 days of treatment. 6 tablet Bernardino Ditch, NP   benzonatate  (TESSALON ) 100 MG capsule Take 2 capsules (200 mg total) by mouth every 8 (eight) hours. 21 capsule Bernardino Ditch, NP   ipratropium (ATROVENT) 0.06 % nasal spray Place 2 sprays into both nostrils 4 (four) times daily. 15 mL Bernardino Ditch, NP   promethazine-dextromethorphan (PROMETHAZINE-DM) 6.25-15 MG/5ML syrup Take 5 mLs by mouth 4 (four) times daily as needed. 118 mL Bernardino Ditch, NP      PDMP not reviewed this encounter.   Bernardino Ditch, NP 07/20/24 1324    Bernardino Ditch, NP 07/23/24 [phone]

## 2024-07-20 NOTE — ED Triage Notes (Signed)
 Pt c/o R ear pain,cough,sore throat & congestion x5 days. Has tried OTC meds w/o relief.

## 2024-07-20 NOTE — Telephone Encounter (Signed)
 FYI Only or Action Required?: Action required by provider: request for appointment.  Patient was last seen in primary care on 07/19/2024 by Karina Neal A, DO.  Called Nurse Triage reporting Cough.  Symptoms began several days ago.  Interventions attempted: OTC medications: cough med.  Symptoms are: unchanged.  Triage Disposition: See Physician Within 24 Hours  Patient/caregiver understands and will follow disposition?: Yes   Copied from CRM #[id number]. Topic: Clinical - Red Word Triage >> Jul 20, 2024 11:25 AM Rea ORN wrote: Red Word that prompted transfer to Nurse Triage: Productive cough, mucus, right ear hearing loss Reason for Disposition  Earache  Answer Assessment - Initial Assessment Questions No available appts today. Advised UC today and ED if symptoms worsen.  Patient requesting appt today.  1. ONSET: When did the cough begin?      Last Friday 2. SEVERITY: How bad is the cough today?      moderate 3. SPUTUM: Describe the color of your sputum (e.g., none, dry cough; clear, white, yellow, green)     clear 4. HEMOPTYSIS: Are you coughing up any blood? If Yes, ask: How much? (e.g., flecks, streaks, tablespoons, etc.)     no 5. DIFFICULTY BREATHING: Are you having difficulty breathing? If Yes, ask: How bad is it? (e.g., mild, moderate, severe)      mild 6. FEVER: Do you have a fever? If Yes, ask: What is your temperature, how was it measured, and when did it start?     Chills denies fever, n/v 7. CARDIAC HISTORY: Do you have any history of heart disease? (e.g., heart attack, congestive heart failure)      no 8. LUNG HISTORY: Do you have any history of lung disease?  (e.g., pulmonary embolus, asthma, emphysema)     no 9. PE RISK FACTORS: Do you have a history of blood clots? (or: recent major surgery, recent prolonged travel, bedridden)     no 10. OTHER SYMPTOMS: Do you have any other symptoms? (e.g., runny nose, wheezing, chest pain)        Denies wheezing, chest pain, dizziness Sob, right ear ache, congestion, HA,  12. TRAVEL: Have you traveled out of the country in the last month? (e.g., travel history, exposures)       no  Protocols used: Cough - Acute Productive-A-AH

## 2024-07-20 NOTE — Telephone Encounter (Signed)
 Per chart review patient at [facility] now

## 2024-07-20 NOTE — Discharge Instructions (Signed)
 Your strep test today was negative but I am consider swab for culture.  Take the azithromycin  once daily for 5 days for treatment of your upper respiratory tract infection and your right ear infection.  Use over-the-counter Tylenol  and/or ibuprofen  according to the package instructions as needed for any fever or pain.  Use the Atrovent nasal spray, 2 squirts in each nostril every 6 hours, as needed for runny nose and postnasal drip.  Use the Tessalon  Perles every 8 hours during the day.  Take them with a small sip of water.  They may give you some numbness to the base of your tongue or a metallic taste in your mouth, this is normal.  Use the Promethazine DM cough syrup at bedtime for cough and congestion.  It will make you drowsy so do not take it during the day.  Return for reevaluation or see your primary care provider for any new or worsening symptoms.

## 2024-07-23 ENCOUNTER — Ambulatory Visit (HOSPITAL_COMMUNITY): Payer: Self-pay

## 2024-07-23 LAB — CULTURE, GROUP A STREP (THRC)

## 2024-08-09 ENCOUNTER — Other Ambulatory Visit: Payer: Self-pay | Admitting: Bariatrics

## 2024-08-09 DIAGNOSIS — R7303 Prediabetes: Secondary | ICD-10-CM

## 2024-08-17 ENCOUNTER — Ambulatory Visit: Payer: PRIVATE HEALTH INSURANCE | Admitting: Bariatrics

## 2024-08-17 ENCOUNTER — Encounter: Payer: Self-pay | Admitting: Bariatrics

## 2024-08-17 VITALS — BP 125/78 | HR 75 | Ht 64.0 in | Wt 199.0 lb

## 2024-08-17 DIAGNOSIS — R5383 Other fatigue: Secondary | ICD-10-CM

## 2024-08-17 DIAGNOSIS — R632 Polyphagia: Secondary | ICD-10-CM

## 2024-08-17 DIAGNOSIS — E559 Vitamin D deficiency, unspecified: Secondary | ICD-10-CM

## 2024-08-17 DIAGNOSIS — E6609 Other obesity due to excess calories: Secondary | ICD-10-CM

## 2024-08-17 MED ORDER — PHENTERMINE-TOPIRAMATE ER 15-92 MG PO CP24: ORAL_CAPSULE | ORAL | 0 refills | Status: DC

## 2024-08-17 MED ORDER — VITAMIN D (ERGOCALCIFEROL) 1.25 MG (50000 UNIT) PO CAPS: 50000.0000 [IU] | ORAL_CAPSULE | ORAL | 0 refills | Status: AC

## 2024-08-17 NOTE — Progress Notes (Signed)
 WEIGHT SUMMARY AND BIOMETRICS  Weight Lost Since Last Visit: 5lb  Weight Gained Since Last Visit: 0   Vitals BP: 125/78 Pulse Rate: 75 SpO2: 96 %   Anthropometric Measurements Height: 5' 4 (1.626 m) Weight: 199 lb (90.3 kg) BMI (Calculated): 34.14 Weight at Last Visit: 204lb Weight Lost Since Last Visit: 5lb Weight Gained Since Last Visit: 0 Starting Weight: 222lb Total Weight Loss (lbs): 23 lb (10.4 kg) Peak Weight: 227lb   Body Composition  Body Fat %: 43.4 % Fat Mass (lbs): 86.4 lbs Muscle Mass (lbs): 106.8 lbs Total Body Water (lbs): 71.2 lbs Visceral Fat Rating : 11   Other Clinical Data RMR: 1757 Fasting: yes Labs: yes Today's Visit #: 10 Starting Date: 01/15/24    OBESITY Karina Neal is here to discuss her progress with her obesity treatment plan along with follow-up of her obesity related diagnoses.    Nutrition Plan: the Category 2 plan + 100- 50% adherence.  Current exercise: walking  Interim History:  She is down 5 lbs since her last visit.  Eating all of the food on the plan., Protein intake is as prescribed, Is not skipping meals, Journaling consistently., and Water intake is adequate.   Pharmacotherapy: Karina Neal is on Qsymia  15/92 mg 1 capsule by mouth daily in am Adverse side effects: None Hunger is moderately controlled.  Cravings are poorly controlled.  Assessment/Plan:   Polyphagia Karina Neal endorses excessive hunger.  Medication(s): Qsymia  Effects of medication:  moderately controlled. Cravings are moderately controlled.   Plan: Medication(s): Qsymia  15/92 mg 1 capsule by mouth daily in am Will increase water, protein and fiber to help assuage hunger.  Will minimize foods that have a high glucose index/load to minimize reactive hypoglycemia.   Fatigue and SOB  Patient states that she is fatigued with certain activities.    Resting Metabolic Rate ( RMR). RMR  (actual): 1757 kcal RMR (actual):  1771 kcal RMR ( expected): 1569 kcal RMR actual >than expected RMR.   Plan:  Read and interpreted the RMR readings to the patient. Patient voiced understanding.  Discussed the implications for the chosen plan and exercise based on the RMR reading.  Will consider repeating the RMR in the future based on weight loss.     Generalized Obesity: Current BMI BMI (Calculated): 34.14   Pharmacotherapy Plan Continue and refill  Qsymia  15/92 mg 1 capsule by mouth daily in am  Karina Neal is currently in the action stage of change. As such, her goal is to continue with weight loss efforts.  She has agreed to the Category 2 plan.  Exercise goals: All adults should avoid inactivity. Some physical activity is better than none, and adults who participate in any amount of physical activity gain some health benefits.  Behavioral modification strategies: increasing lean protein intake, decreasing simple carbohydrates , no meal skipping, meal planning , increase water intake, better snacking  choices, planning for success, keep healthy foods in the home, and mindful eating.  Karina Neal has agreed to follow-up with our clinic in 4 weeks.     Objective:   VITALS: Per patient if applicable, see vitals. GENERAL: Alert and in no acute distress. CARDIOPULMONARY: No increased WOB. Speaking in clear sentences.  PSYCH: Pleasant and cooperative. Speech normal rate and rhythm. Affect is appropriate. Insight and judgement are appropriate. Attention is focused, linear, and appropriate.  NEURO: Oriented as arrived to appointment on time with no prompting.   Attestation Statements:   This was prepared with the assistance of Engineer, Civil (consulting).  Occasional wrong-word or sound-a-like substitutions may have occurred due to the inherent limitations of voice recognition.  Clayborne Daring, DO

## 2024-09-06 ENCOUNTER — Ambulatory Visit: Payer: Self-pay

## 2024-09-06 NOTE — Telephone Encounter (Signed)
 FYI Only or Action Required?: FYI only for provider: appointment scheduled on 12.31.25.  Patient was last seen in primary care on 08/17/2024 by Delores Shields A, DO.  Called Nurse Triage reporting Allergic Reaction.  Symptoms began a week ago.  Interventions attempted: Other: unknown.  Symptoms are: gradually worsening.  Triage Disposition: See Physician Within 24 Hours  Patient/caregiver understands and will follow disposition?: Yes     Copied from CRM #[id number]. Topic: Clinical - Red Word Triage >> Sep 06, 2024 11:39 AM Chasity T wrote: [facility] that prompted transfer to Nurse Triage: patient is calling in because she feels like she is having an allergic reaction in vaginal area. States she just switch soaps and its been irritation for about a week. Reason for Disposition  MODERATE-SEVERE itching (i.e., interferes with school, work, or sleep)  Answer Assessment - Initial Assessment Questions Pt states vaginal itching x 1 week. She thinks it is from a new body wash she has been using. No symptoms anywhere else on her body. Has not looked to see if rash or redness. States feels both internal and external. Denies any other symptoms. Due to schedule availability, pt scheduled on Wednesday. Did advise pt to go to ER if symptoms get worse.    1. SYMPTOM: What's the main symptom you're concerned about? (e.g., rash, itching, swelling, dryness)     itching 2. LOCATION: Where is the  itching located? (e.g., inside/outside, left/right)     Inside and outside 3. ONSET: When did the  itching  start?     About a week 5. CAUSE: What do you think is causing the symptoms?     Thinks new body wash 6. OTHER SYMPTOMS: Do you have any other symptoms? (e.g., fever, vaginal bleeding, pain with urination)     denies  Protocols used: [place] Symptoms-A-AH

## 2024-09-06 NOTE — Telephone Encounter (Signed)
 noted

## 2024-09-07 NOTE — Telephone Encounter (Signed)
 Noted

## 2024-09-08 ENCOUNTER — Encounter: Payer: Self-pay | Admitting: Primary Care

## 2024-09-08 ENCOUNTER — Ambulatory Visit: Payer: PRIVATE HEALTH INSURANCE | Admitting: Primary Care

## 2024-09-08 VITALS — BP 118/70 | HR 78 | Temp 98.2°F | Ht 64.0 in | Wt 206.0 lb

## 2024-09-08 DIAGNOSIS — H6121 Impacted cerumen, right ear: Secondary | ICD-10-CM | POA: Diagnosis not present

## 2024-09-08 DIAGNOSIS — N898 Other specified noninflammatory disorders of vagina: Secondary | ICD-10-CM

## 2024-09-08 DIAGNOSIS — H6120 Impacted cerumen, unspecified ear: Secondary | ICD-10-CM | POA: Insufficient documentation

## 2024-09-08 DIAGNOSIS — R3129 Other microscopic hematuria: Secondary | ICD-10-CM

## 2024-09-08 LAB — POC URINALSYSI DIPSTICK (AUTOMATED)
Bilirubin, UA: NEGATIVE
Glucose, UA: NEGATIVE
Ketones, UA: NEGATIVE
Nitrite, UA: NEGATIVE
Protein, UA: NEGATIVE
Spec Grav, UA: >=1.03 — AB
Urobilinogen, UA: 0.2 U/dL
pH, UA: 6

## 2024-09-08 LAB — URINALYSIS, MICROSCOPIC ONLY

## 2024-09-08 NOTE — Progress Notes (Signed)
 "  Subjective:    Patient ID: Karina Neal, female    DOB: March 07, 1971, 52 y.o.   MRN: [id number]  Karina Neal is a very pleasant 53 y.o. female with a history of hypertension, CVA, bacterial vaginosis who presents today to discuss vaginal itching and ear fullness.   Symptom onset about 1 week ago with internal and external vaginal itching without discharge. She began using a new body wash around the same time and questions if symptoms are secondary. She began using a mild body wash and has noticed slight improvement.   She denies vaginal discharge, hematuria, increased urinary frequency, dysuria.   Diagnosed with acute otitis media per urgent care on 07/20/24, treated with antibiotics. Since then she's continued to notice right ear fullness without pain. She denies fevers, cough, sore throat.    Review of Systems  HENT:  Negative for sore throat.        Right ear fullness  Genitourinary:  Negative for dysuria, flank pain, hematuria, urgency and vaginal discharge.       Vaginal itching         Past Medical History:  Diagnosis Date   Abnormal uterine bleeding 08/27/2022   Anxiety    Asthma    Back pain    Blood pressure elevated    Chicken pox    Constrictive jewelry of finger 10/31/2020   Frequent headaches    Hypertension    Left sided numbness 04/23/2015   Paresthesia 01/17/2022    Social History   Socioeconomic History   Marital status: Married    Spouse name: Not on file   Number of children: Not on file   Years of education: Not on file   Highest education level: 12th grade  Occupational History   Not on file  Tobacco Use   Smoking status: Never   Smokeless tobacco: Never  Vaping Use   Vaping status: Never Used  Substance and Sexual Activity   Alcohol use: Yes    Comment: social   Drug use: No   Sexual activity: Not on file  Other Topics Concern   Not on file  Social History Narrative   ** Merged History Encounter **       ** Merged History Encounter  **       Social Drivers of Health   Tobacco Use: Low Risk (09/08/2024)   Patient History    Smoking Tobacco Use: Never    Smokeless Tobacco Use: Never    Passive Exposure: Not on file  Financial Resource Strain: Low Risk (09/30/2023)   Overall Financial Resource Strain (CARDIA)    Difficulty of Paying Living Expenses: Not hard at all  Food Insecurity: No Food Insecurity (09/30/2023)   Hunger Vital Sign    Worried About Running Out of Food in the Last Year: Never true    Ran Out of Food in the Last Year: Never true  Transportation Needs: No Transportation Needs (09/30/2023)   PRAPARE - Administrator, Civil Service (Medical): No    Lack of Transportation (Non-Medical): No  Physical Activity: Sufficiently Active (09/30/2023)   Exercise Vital Sign    Days of Exercise per Week: 7 days    Minutes of Exercise per Session: 150+ min  Stress: Stress Concern Present (09/30/2023)   [employer] of Occupational Health - Occupational Stress Questionnaire    Feeling of Stress : Very much  Social Connections: Unknown (09/30/2023)   Social Connection and Isolation Panel    Frequency of Communication with Friends  and Family: Twice a week    Frequency of Social Gatherings with Friends and Family: Once a week    Attends Religious Services: Patient declined    Database Administrator or Organizations: Patient declined    Attends Banker Meetings: Not on file    Marital Status: Separated  Intimate Partner Violence: Not on file  Depression (PHQ2-9): Low Risk (09/08/2024)   Depression (PHQ2-9)    PHQ-2 Score: 3  Recent Concern: Depression (PHQ2-9) - Medium Risk (06/28/2024)   Depression (PHQ2-9)    PHQ-2 Score: 7  Alcohol Screen: Low Risk (09/30/2023)   Alcohol Screen    Last Alcohol Screening Score (AUDIT): 1  Housing: High Risk (09/30/2023)   Housing Stability Vital Sign    Unable to Pay for Housing in the Last Year: Yes    Number of Times Moved in the Last Year: 0     Homeless in the Last Year: No  Utilities: Not on file  Health Literacy: Not on file    Past Surgical History:  Procedure Laterality Date   APPENDECTOMY     CESAREAN SECTION     CESAREAN SECTION     EYE SURGERY      Family History  Problem Relation Age of Onset   Hyperlipidemia Mother    Hypertension Mother    Diabetes Mother    Sleep apnea Mother    Hyperlipidemia Father    Hypertension Father    Diabetes Father    Sleep apnea Father    Hypertension Sister    Hypertension Brother    Breast cancer Neg Hx     Allergies[1]  Medications Ordered Prior to Encounter[2]  BP 118/70   Pulse 78   Temp 98.2 F (36.8 C) (Oral)   Ht 5' 4 (1.626 m)   Wt 206 lb (93.4 kg)   LMP  (LMP Unknown)   SpO2 97%   BMI 35.36 kg/m  Objective:   Physical Exam Exam conducted with a chaperone present.  HENT:     Right Ear: There is impacted cerumen.  Cardiovascular:     Rate and Rhythm: Normal rate and regular rhythm.  Pulmonary:     Effort: Pulmonary effort is normal.     Breath sounds: Normal breath sounds.  Genitourinary:    Labia:        Right: No rash.        Left: No rash.      Vagina: Vaginal discharge present.     Comments: Moderate amount of whitish/green discharge without foul smell.  Skin:    General: Skin is warm and dry.     Physical Exam        Assessment & Plan:  Vaginal itching Assessment & Plan: Differentials include acute cystitis, vaginal infection, STD, skin allergic reaction.  UA today with 1+ leuks, trace blood. Urine culture and microscopic ordered and pending.  Wet prep, gonorrhea, and chlamydia testing pending.  Await results.   Orders: -     POCT Urinalysis Dipstick (Automated) -     WET PREP BY MOLECULAR PROBE -     C. trachomatis/N. gonorrhoeae RNA  Microscopic hematuria -     Urine Microscopic -     Urine Culture  Impacted cerumen of right ear Assessment & Plan: Right cerumen impaction identified on exam. Patient consented to  irrigation of right canal  Right canals irrigated. Patient tolerated well. TM's and canals post irrigation unremarkable.   Discussed home care instructions.  Assessment and Plan Assessment & Plan         Comer MARLA Gaskins, NP       [1]  Allergies Allergen Reactions   Shellfish Allergy Anaphylaxis    Throat swelling, dyspnea   Penicillins Hives    Has patient had a PCN reaction causing immediate rash, facial/tongue/throat swelling, SOB or lightheadedness with hypotension: Unknown Has patient had a PCN reaction causing severe rash involving mucus membranes or skin necrosis: No Has patient had a PCN reaction that required hospitalization: No Has patient had a PCN reaction occurring within the last 10 years: No If all of the above answers are NO, then may proceed with Cephalosporin use.   [2]  Current Outpatient Medications on File Prior to Visit  Medication Sig Dispense Refill   cholecalciferol (VITAMIN D3) 25 MCG (1000 UNIT) tablet Take 1,000 Units by mouth daily.     famotidine  (PEPCID ) 20 MG tablet Take 1 tablet (20 mg total) by mouth 2 (two) times daily. for heartburn. 180 tablet 0   fluticasone  (FLONASE ) 50 MCG/ACT nasal spray PLACE 1 SPRAY INTO BOTH NOSTRILS 2 (TWO) TIMES DAILY 48 mL 1   hydrochlorothiazide  (HYDRODIURIL ) 12.5 MG tablet TAKE 1 TABLET (12.5 MG TOTAL) BY MOUTH DAILY FOR BLOOD PRESSURE 90 tablet 2   hydrOXYzine  (ATARAX ) 10 MG tablet TAKE 1-2 TABLETS (10-20 MG TOTAL) BY MOUTH AT BEDTIME AS NEEDED FOR ANXIETY. 180 tablet 0   ipratropium (ATROVENT ) 0.06 % nasal spray Place 2 sprays into both nostrils 4 (four) times daily. 15 mL 12   metFORMIN  (GLUCOPHAGE ) 500 MG tablet TAKE 1 TABLET BY MOUTH DAILY WITH SUPPER. 90 tablet 0   Phentermine -Topiramate  ER 15-92 MG CP24 1 capsule daily in the am 30 capsule 0   vitamin B-12 (CYANOCOBALAMIN ) 100 MCG tablet Take 100 mcg by mouth daily.     Vitamin D , Ergocalciferol , (DRISDOL ) 1.25 MG (50000 UNIT) CAPS  capsule Take 1 capsule (50,000 Units total) by mouth every 7 (seven) days. 5 capsule 0   No current facility-administered medications on file prior to visit.   "

## 2024-09-08 NOTE — Assessment & Plan Note (Signed)
 Differentials include acute cystitis, vaginal infection, STD, skin allergic reaction.  UA today with 1+ leuks, trace blood. Urine culture and microscopic ordered and pending.  Wet prep, gonorrhea, and chlamydia testing pending.  Await results.

## 2024-09-08 NOTE — Assessment & Plan Note (Signed)
 Right cerumen impaction identified on exam. Patient consented to irrigation of right canal  Right canals irrigated. Patient tolerated well. TM's and canals post irrigation unremarkable.   Discussed home care instructions.

## 2024-09-08 NOTE — Patient Instructions (Signed)
 Will be in touch once you receive the results of your urine test and vaginal swabs.  It was a pleasure to see you today!

## 2024-09-10 ENCOUNTER — Ambulatory Visit: Payer: Self-pay | Admitting: Primary Care

## 2024-09-10 DIAGNOSIS — A599 Trichomoniasis, unspecified: Secondary | ICD-10-CM

## 2024-09-10 LAB — URINE CULTURE
MICRO NUMBER:: 17415306
Result:: NO GROWTH
SPECIMEN QUALITY:: ADEQUATE

## 2024-09-10 LAB — WET PREP BY MOLECULAR PROBE
Candida species: NOT DETECTED
Gardnerella vaginalis: NOT DETECTED
MICRO NUMBER:: 17415305
SPECIMEN QUALITY:: ADEQUATE
Trichomonas vaginosis: DETECTED — AB

## 2024-09-10 LAB — C. TRACHOMATIS/N. GONORRHOEAE RNA
C. trachomatis RNA, TMA: NOT DETECTED
N. gonorrhoeae RNA, TMA: NOT DETECTED

## 2024-09-10 MED ORDER — METRONIDAZOLE 500 MG PO TABS: 500.0000 mg | ORAL_TABLET | Freq: Two times a day (BID) | ORAL | 0 refills | Status: AC

## 2024-09-20 ENCOUNTER — Ambulatory Visit: Payer: PRIVATE HEALTH INSURANCE | Admitting: Bariatrics

## 2024-09-20 ENCOUNTER — Encounter: Payer: Self-pay | Admitting: Bariatrics

## 2024-09-20 VITALS — BP 122/78 | HR 73 | Temp 98.0°F | Ht 64.0 in | Wt 197.0 lb

## 2024-09-20 DIAGNOSIS — I10 Essential (primary) hypertension: Secondary | ICD-10-CM | POA: Diagnosis not present

## 2024-09-20 DIAGNOSIS — E669 Obesity, unspecified: Secondary | ICD-10-CM

## 2024-09-20 DIAGNOSIS — E6609 Other obesity due to excess calories: Secondary | ICD-10-CM

## 2024-09-20 DIAGNOSIS — R632 Polyphagia: Secondary | ICD-10-CM

## 2024-09-20 DIAGNOSIS — Z6833 Body mass index (BMI) 33.0-33.9, adult: Secondary | ICD-10-CM

## 2024-09-20 MED ORDER — PHENTERMINE-TOPIRAMATE ER 15-92 MG PO CP24: ORAL_CAPSULE | ORAL | 0 refills | Status: AC

## 2024-09-20 NOTE — Progress Notes (Signed)
 "                                                                                                             WEIGHT SUMMARY AND BIOMETRICS  Weight Lost Since Last Visit: 2 lb  Weight Gained Since Last Visit: 0   Vitals Temp: 98 F (36.7 C) BP: 122/78 Pulse Rate: 73 SpO2: 95 %   Anthropometric Measurements Height: 5' 4 (1.626 m) Weight: 197 lb (89.4 kg) BMI (Calculated): 33.8 Weight at Last Visit: 199 lb Weight Lost Since Last Visit: 2 lb Weight Gained Since Last Visit: 0 Starting Weight: 222 lb Total Weight Loss (lbs): 25 lb (11.3 kg) Peak Weight: 227 lb   Body Composition  Body Fat %: 42.8 % Fat Mass (lbs): 84.6 lbs Muscle Mass (lbs): 107.2 lbs Total Body Water (lbs): 71.8 lbs Visceral Fat Rating : 11   Other Clinical Data RMR: [id number] Fasting: no Labs: no Today's Visit #: 11 Starting Date: 01/15/24    OBESITY Tityana is here to discuss her progress with her obesity treatment plan along with follow-up of her obesity related diagnoses.    Nutrition Plan: the Category 2 plan+100 - 50% adherence.  Current exercise: cardiovascular workout on exercise equipment  Interim History:  She is down 2 lbs since her last visit.  Eating all of the food on the plan., Protein intake is as prescribed, Is skipping meals, and Journaling consistently.   Pharmacotherapy: Dudley is on Qsymia  15/92 mg 1 capsule by mouth daily in am Adverse side effects: None Hunger is moderately controlled.  Cravings are moderately controlled.  Assessment/Plan:   Polyphagia Cedric endorses excessive hunger.  Medication(s): Qsymia   Effects of medication (appetite):  moderately controlled. Her appetite is up slightly, but she has been under more stress   Cravings are moderately controlled.   Plan: Medication(s): Qsymia  15/92 mg 1 capsule by mouth daily in am Will increase water, protein and fiber to help assuage hunger.  Will minimize foods that have a high glucose index/load to  minimize reactive hypoglycemia.  Will pack lunch for work.  Will increase her fiber to around 30 grams daily slowly.   Hypertension Hypertension well controlled.  Medication(s): Hydrochlorothiazide  12.5 mg daily  BP Readings from Last 3 Encounters:  09/20/24 122/78  09/08/24 118/70  08/17/24 125/78   Lab Results  Component Value Date   CREATININE 0.73 01/15/2024   CREATININE 0.76 05/22/2023   CREATININE 0.76 02/12/2023   Lab Results  Component Value Date   GFR 90.74 05/22/2023   GFR 90.91 02/12/2023   GFR 81.23 01/17/2022    Plan: Continue all antihypertensives at current dosages. No added salt. Will keep sodium content to 1,500 mg or less per day.     Generalized Obesity: Current BMI BMI (Calculated): 33.8   Pharmacotherapy Plan Continue and refill  Qsymia  15/92 mg 1 capsule by mouth daily in am  Anastazja is currently in the action stage of change. As such, her goal is to continue with weight loss efforts.  She has agreed to the Category  2 plan + 100 calories, fiber rich and less carbohydrates.   Exercise goals: All adults should avoid inactivity. Some physical activity is better than none, and adults who participate in any amount of physical activity gain some health benefits.  Behavioral modification strategies: increasing lean protein intake, no meal skipping, decrease eating out, meal planning , increase water intake, better snacking choices, planning for success, increasing fiber rich foods, weigh protein portions, measure portion sizes, work on smaller portions, pack lunch for work, and mindful eating.  Markesha has agreed to follow-up with our clinic in 4 weeks.   Objective:   VITALS: Per patient if applicable, see vitals. GENERAL: Alert and in no acute distress. CARDIOPULMONARY: No increased WOB. Speaking in clear sentences.  PSYCH: Pleasant and cooperative. Speech normal rate and rhythm. Affect is appropriate. Insight and judgement are appropriate. Attention is  focused, linear, and appropriate.  NEURO: Oriented as arrived to appointment on time with no prompting.   Attestation Statements:   This was prepared with the assistance of Engineer, Civil (consulting).  Occasional wrong-word or sound-a-like substitutions may have occurred due to the inherent limitations of voice recognition.   Clayborne Daring, DO    "

## 2024-09-27 ENCOUNTER — Ambulatory Visit
Admission: EM | Admit: 2024-09-27 | Discharge: 2024-09-27 | Disposition: A | Discharge status: Home / Self Care | Payer: PRIVATE HEALTH INSURANCE | Attending: Emergency Medicine | Admitting: Emergency Medicine

## 2024-09-27 ENCOUNTER — Ambulatory Visit (INDEPENDENT_AMBULATORY_CARE_PROVIDER_SITE_OTHER): Payer: PRIVATE HEALTH INSURANCE

## 2024-09-27 ENCOUNTER — Encounter: Payer: Self-pay | Admitting: Emergency Medicine

## 2024-09-27 ENCOUNTER — Ambulatory Visit (HOSPITAL_COMMUNITY): Payer: Self-pay

## 2024-09-27 DIAGNOSIS — M25531 Pain in right wrist: Secondary | ICD-10-CM

## 2024-09-27 NOTE — Discharge Instructions (Signed)
 Wear the wrist splint as directed.  Rest and elevate your wrist.  Take Tylenol  or ibuprofen  as needed for discomfort.  Apply ice packs as directed.    Follow-up with an orthopedist such as the one listed below.

## 2024-09-27 NOTE — ED Triage Notes (Signed)
 Provider triage

## 2024-09-27 NOTE — ED Provider Notes (Signed)
 " Karina Neal    CSN: [id number] Arrival date & time: 09/27/24  1507      History   Chief Complaint Chief Complaint  Patient presents with   Wrist Pain    HPI [name] is a 54 y.o. female.  Patient presents with pain and swelling of her right wrist that started this morning.  No falls or injury.  No open wounds, bruising, redness, numbness, weakness.  No OTC medications taken today.  The history is provided by the patient and medical records.    Past Medical History:  Diagnosis Date   Abnormal uterine bleeding 08/27/2022   Anxiety    Asthma    Back pain    Blood pressure elevated    Chicken pox    Constrictive jewelry of finger 10/31/2020   Frequent headaches    Hypertension    Left sided numbness 04/23/2015   Paresthesia 01/17/2022    Patient Active Problem List   Diagnosis Date Noted   Vaginal itching 09/08/2024   Cerumen impaction 09/08/2024   Tendonitis of wrist, right 10/21/2023   Chronic shoulder pain 01/23/2023   Anxiety 08/27/2022   Prediabetes 03/05/2022   Vitamin D  deficiency 03/05/2022   Sleep disturbance 01/17/2022   Obesity, Class II, BMI 35-39.9 12/18/2021   Chronic heel pain 05/10/2021   Thrombocytopenia 03/27/2021   Essential hypertension 03/02/2021   GERD (gastroesophageal reflux disease) 01/02/2021   Preventative health care 04/10/2016   Migraines    History of stroke 04/23/2015    Past Surgical History:  Procedure Laterality Date   APPENDECTOMY     CESAREAN SECTION     CESAREAN SECTION     EYE SURGERY      OB History     Gravida  3   Para      Term      Preterm      AB      Living         SAB      IAB      Ectopic      Multiple      Live Births               Home Medications    Prior to Admission medications  Medication Sig Start Date End Date Taking? Authorizing Provider  cholecalciferol (VITAMIN D3) 25 MCG (1000 UNIT) tablet Take 1,000 Units by mouth daily.    [provider]   famotidine  (PEPCID ) 20 MG tablet Take 1 tablet (20 mg total) by mouth 2 (two) times daily. for heartburn. 06/28/24   Clark, Katherine K, NP  fluticasone  (FLONASE ) 50 MCG/ACT nasal spray PLACE 1 SPRAY INTO BOTH NOSTRILS 2 (TWO) TIMES DAILY 06/17/22   Clark, Katherine K, NP  hydrochlorothiazide  (HYDRODIURIL ) 12.5 MG tablet TAKE 1 TABLET (12.5 MG TOTAL) BY MOUTH DAILY FOR BLOOD PRESSURE 07/13/24   Clark, Katherine K, NP  hydrOXYzine  (ATARAX ) 10 MG tablet TAKE 1-2 TABLETS (10-20 MG TOTAL) BY MOUTH AT BEDTIME AS NEEDED FOR ANXIETY. 06/24/23   Clark, Katherine K, NP  ipratropium (ATROVENT ) 0.06 % nasal spray Place 2 sprays into both nostrils 4 (four) times daily. 07/20/24   Bernardino Ditch, NP  metFORMIN  (GLUCOPHAGE ) 500 MG tablet TAKE 1 TABLET BY MOUTH DAILY WITH SUPPER. 08/09/24   Delores Shields A, DO  Phentermine -Topiramate  ER 15-92 MG CP24 1 capsule daily in the am 09/20/24   Delores Shields A, DO  vitamin B-12 (CYANOCOBALAMIN ) 100 MCG tablet Take 100 mcg by mouth daily.  [provider]  Vitamin D , Ergocalciferol , (DRISDOL ) 1.25 MG (50000 UNIT) CAPS capsule Take 1 capsule (50,000 Units total) by mouth every 7 (seven) days. 08/17/24   Delores Clayborne LABOR, DO    Family History Family History  Problem Relation Age of Onset   Hyperlipidemia Mother    Hypertension Mother    Diabetes Mother    Sleep apnea Mother    Hyperlipidemia Father    Hypertension Father    Diabetes Father    Sleep apnea Father    Hypertension Sister    Hypertension Brother    Breast cancer Neg Hx     Social History Social History[1]   Allergies   Shellfish allergy and Penicillins   Review of Systems Review of Systems  Constitutional:  Negative for chills and fever.  Musculoskeletal:  Positive for arthralgias and joint swelling.  Skin:  Negative for color change, rash and wound.  Neurological:  Negative for weakness and numbness.     Physical Exam Triage Vital Signs ED Triage Vitals [09/27/24 1620]  Encounter  Vitals Group     BP 114/83     Girls Systolic BP Percentile      Girls Diastolic BP Percentile      Boys Systolic BP Percentile      Boys Diastolic BP Percentile      Pulse Rate 90     Resp 18     Temp 98.3 F (36.8 C)     Temp src      SpO2 95 %     Weight      Height      Head Circumference      Peak Flow      Pain Score      Pain Loc      Pain Education      Exclude from Growth Chart    No data found.  Updated Vital Signs BP 114/83   Pulse 90   Temp 98.3 F (36.8 C)   Resp 18   LMP  (LMP Unknown)   SpO2 95%   Visual Acuity Right Eye Distance:   Left Eye Distance:   Bilateral Distance:    Right Eye Near:   Left Eye Near:    Bilateral Near:     Physical Exam Constitutional:      General: She is not in acute distress. HENT:     Mouth/Throat:     Mouth: Mucous membranes are moist.  Cardiovascular:     Rate and Rhythm: Normal rate.  Pulmonary:     Effort: Pulmonary effort is normal. No respiratory distress.  Musculoskeletal:        General: Swelling and tenderness present. No deformity. Normal range of motion.     Comments: Right wrist pain and swelling on radial side.  No wounds, ecchymosis, erythema.  2+ radial pulse, FROM, sensation intact, strong handgrip.  Skin:    General: Skin is warm and dry.     Capillary Refill: Capillary refill takes less than 2 seconds.     Findings: No bruising, erythema, lesion or rash.  Neurological:     General: No focal deficit present.     Mental Status: She is alert.     Sensory: No sensory deficit.     Motor: No weakness.      UC Treatments / Results  Labs (all labs ordered are listed, but only abnormal results are displayed) Labs Reviewed - No data to display  EKG   Radiology DG Wrist Complete Right Result  Date: 09/27/2024 CLINICAL DATA:  Right wrist pain.  No reported injury. EXAM: RIGHT WRIST - COMPLETE 3+ VIEW COMPARISON:  Right hand radiographs dated 10/06/2023. FINDINGS: There is no evidence of  fracture or dislocation. The carpal rows are intact and demonstrate normal alignment. Mild radiocarpal joint space narrowing. Soft tissue swelling of the radial wrist/distal forearm. No radiopaque foreign body. IMPRESSION: 1. No acute osseous abnormality. 2.  Mild radiocarpal joint space narrowing. 3. Soft tissue swelling of the radial wrist/distal forearm. Electronically Signed   By: Harrietta Sherry M.D.   On: 09/27/2024 16:56    Procedures Procedures (including critical care time)  Medications Ordered in UC Medications - No data to display  Initial Impression / Assessment and Plan / UC Course  I have reviewed the triage vital signs and the nursing notes.  Pertinent labs & imaging results that were available during my care of the patient were reviewed by me and considered in my medical decision making (see chart for details).    Right wrist pain.  X-ray shows 1. No acute osseous abnormality. 2.  Mild radiocarpal joint space narrowing. 3. Soft tissue swelling of the radial wrist/distal forearm.  Treating with wrist splint, rest, elevation, ice packs, Tylenol  or ibuprofen .  Education provided on wrist pain.  Instructed patient to follow-up with an orthopedist.  Contact information for on-call Ortho provided.  She agrees to plan of care.  Final Clinical Impressions(s) / UC Diagnoses   Final diagnoses:  Right wrist pain     Discharge Instructions      Wear the wrist splint as directed.  Rest and elevate your wrist.  Take Tylenol  or ibuprofen  as needed for discomfort.  Apply ice packs as directed.    Follow-up with an orthopedist such as the one listed below.     ED Prescriptions   None    PDMP not reviewed this encounter.     [1]  Social History Tobacco Use   Smoking status: Never   Smokeless tobacco: Never  Vaping Use   Vaping status: Never Used  Substance Use Topics   Alcohol use: Yes    Comment: social   Drug use: No     Corlis Burnard DEL, NP 09/27/24 1709  "

## 2024-09-28 ENCOUNTER — Other Ambulatory Visit: Payer: Self-pay | Admitting: Primary Care

## 2024-09-28 DIAGNOSIS — K219 Gastro-esophageal reflux disease without esophagitis: Secondary | ICD-10-CM

## 2024-10-18 ENCOUNTER — Ambulatory Visit: Payer: PRIVATE HEALTH INSURANCE | Admitting: Bariatrics
